# Patient Record
Sex: Female | Born: 1990 | Race: White | Hispanic: No | Marital: Married | Smoking: Former smoker
Health system: Southern US, Community
[De-identification: ages and names within clinical notes are randomized; demographics above are authoritative.]

## PROBLEM LIST (undated history)

## (undated) DIAGNOSIS — F319 Bipolar disorder, unspecified: Secondary | ICD-10-CM

## (undated) DIAGNOSIS — F32A Depression, unspecified: Secondary | ICD-10-CM

## (undated) DIAGNOSIS — O139 Gestational [pregnancy-induced] hypertension without significant proteinuria, unspecified trimester: Secondary | ICD-10-CM

## (undated) DIAGNOSIS — F419 Anxiety disorder, unspecified: Secondary | ICD-10-CM

## (undated) DIAGNOSIS — F329 Major depressive disorder, single episode, unspecified: Secondary | ICD-10-CM

## (undated) DIAGNOSIS — R011 Cardiac murmur, unspecified: Secondary | ICD-10-CM

## (undated) DIAGNOSIS — J45909 Unspecified asthma, uncomplicated: Secondary | ICD-10-CM

## (undated) DIAGNOSIS — D171 Benign lipomatous neoplasm of skin and subcutaneous tissue of trunk: Secondary | ICD-10-CM

## (undated) DIAGNOSIS — F3281 Premenstrual dysphoric disorder: Secondary | ICD-10-CM

## (undated) HISTORY — DX: Cardiac murmur, unspecified: R01.1

## (undated) HISTORY — DX: Depression, unspecified: F32.A

## (undated) HISTORY — DX: Major depressive disorder, single episode, unspecified: F32.9

## (undated) HISTORY — DX: Anxiety disorder, unspecified: F41.9

## (undated) HISTORY — DX: Bipolar disorder, unspecified: F31.9

## (undated) NOTE — *Deleted (*Deleted)
BH MD/PA/NP OP Progress Note  12/11/2019 2:26 PM Megan Houston  MRN:  [id number]  Chief Complaint:  HPI: *** Visit Diagnosis:    ICD-10-CM   1. PMDD (premenstrual dysphoric disorder)  F32.81 FLUoxetine (PROZAC) 20 MG capsule  2. Anxiety  F41.9 FLUoxetine (PROZAC) 20 MG capsule    clonazePAM (KLONOPIN) 0.5 MG tablet  3. MDD (major depressive disorder), recurrent, in full remission (HCC)  F33.42 FLUoxetine (PROZAC) 20 MG capsule    clonazePAM (KLONOPIN) 0.5 MG tablet    Past Psychiatric History: ***  Past Medical History:  Past Medical History:  Diagnosis Date  . Anxiety   . Bipolar disorder (HCC)   . Depression    No past surgical history on file.  Family Psychiatric History: ***  Family History:  Family History  Problem Relation Age of Onset  . Hypertension Other   . Diabetes Other   . Hypertension Mother   . Bipolar disorder Mother   . Alcohol abuse Mother   . Drug abuse Mother   . Cancer Mother        cervical  . OCD Father   . Anxiety disorder Father   . Diabetes Father   . Hypertension Father   . Bipolar disorder Sister   . Drug abuse Sister   . Alcohol abuse Sister   . Liver disease Paternal Grandmother   . Diabetes Maternal Grandmother   . Bipolar disorder Maternal Grandmother   . Anxiety disorder Maternal Grandmother   . Schizophrenia Maternal Grandmother     Social History:  Social History   Socioeconomic History  . Marital status: Single    Spouse name: Not on file  . Number of children: 0  . Years of education: Not on file  . Highest education level: Some college, no degree  Occupational History  . Not on file  Tobacco Use  . Smoking status: Former Smoker    Types: Cigarettes    Quit date: 05/27/2014    Years since quitting: 5.5  . Smokeless tobacco: Never Used  Substance and Sexual Activity  . Alcohol use: Not Currently    Alcohol/week: 0.0 standard drinks    Comment: occasional  . Drug use: No  . Sexual activity: Yes    Birth  control/protection: Pill  Other Topics Concern  . Not on file  Social History Narrative  . Not on file   Social Determinants of Health   Financial Resource Strain:   . Difficulty of Paying Living Expenses: Not on file  Food Insecurity:   . Worried About Programme researcher, broadcasting/film/video in the Last Year: Not on file  . Ran Out of Food in the Last Year: Not on file  Transportation Needs:   . Lack of Transportation (Medical): Not on file  . Lack of Transportation (Non-Medical): Not on file  Physical Activity:   . Days of Exercise per Week: Not on file  . Minutes of Exercise per Session: Not on file  Stress:   . Feeling of Stress : Not on file  Social Connections:   . Frequency of Communication with Friends and Family: Not on file  . Frequency of Social Gatherings with Friends and Family: Not on file  . Attends Religious Services: Not on file  . Active Member of Clubs or Organizations: Not on file  . Attends Banker Meetings: Not on file  . Marital Status: Not on file    Allergies:  Allergies  Allergen Reactions  . Codeine Nausea And Vomiting  .  Hydrocodone-Acetaminophen Nausea And Vomiting    Metabolic Disorder Labs: No results found for: HGBA1C, MPG No results found for: PROLACTIN No results found for: CHOL, TRIG, HDL, CHOLHDL, VLDL, LDLCALC No results found for: TSH  Therapeutic Level Labs: No results found for: LITHIUM No results found for: VALPROATE No components found for:  CBMZ  Current Medications: Current Outpatient Medications  Medication Sig Dispense Refill  . azithromycin (ZITHROMAX) 250 MG tablet Take 1 tablet (250 mg total) by mouth daily. Take first 2 tablets together, then 1 every day until finished. 6 tablet 0  . benzonatate (TESSALON) 100 MG capsule Take 1 capsule (100 mg total) by mouth every 8 (eight) hours. 30 capsule 0  . cetirizine (ZYRTEC ALLERGY) 10 MG tablet Take 1 tablet (10 mg total) by mouth daily. 30 tablet 0  . clonazePAM (KLONOPIN) 0.5  MG tablet Take 1 tablet (0.5 mg total) by mouth daily as needed for anxiety. 30 tablet 1  . FLUoxetine (PROZAC) 20 MG capsule Take 1 capsule (20 mg total) by mouth daily. 30 capsule 1  . predniSONE (DELTASONE) 10 MG tablet Take 2 tablets (20 mg total) by mouth daily. 15 tablet 0  . TRI-SPRINTEC 0.18/0.215/0.25 MG-35 MCG tablet Take 1 tablet by mouth daily.   10   No current facility-administered medications for this visit.     Musculoskeletal: Strength & Muscle Tone: {desc; muscle tone:32375} Gait & Station: {PE GAIT ED ZOXW:96045} Patient leans: {Patient Leans:21022755}  Psychiatric Specialty Exam: Review of Systems  There were no vitals taken for this visit.There is no height or weight on file to calculate BMI.  General Appearance: {Appearance:22683}  Eye Contact:  {BHH EYE CONTACT:22684}  Speech:  {Speech:22685}  Volume:  {Volume (PAA):22686}  Mood:  {BHH MOOD:22306}  Affect:  {Affect (PAA):22687}  Thought Process:  {Thought Process (PAA):22688}  Orientation:  {BHH ORIENTATION (PAA):22689}  Thought Content: {Thought Content:22690}   Suicidal Thoughts:  {ST/HT (PAA):22692}  Homicidal Thoughts:  {ST/HT (PAA):22692}  Memory:  {BHH MEMORY:22881}  Judgement:  {Judgement (PAA):22694}  Insight:  {Insight (PAA):22695}  Psychomotor Activity:  {Psychomotor (PAA):22696}  Concentration:  {Concentration:21399}  Recall:  {BHH GOOD/FAIR/POOR:22877}  Fund of Knowledge: {BHH GOOD/FAIR/POOR:22877}  Language: {BHH GOOD/FAIR/POOR:22877}  Akathisia:  {BHH YES OR NO:22294}  Handed:  {Handed:22697}  AIMS (if indicated): {Desc; done/not:10129}  Assets:  {Assets (PAA):22698}  ADL's:  {BHH WUJ'W:11914}  Cognition: {chl bhh cognition:304700322}  Sleep:  {BHH GOOD/FAIR/POOR:22877}   Screenings: PHQ2-9     Office Visit from 07/11/2017 in [facility]  PHQ-2 Total Score 0       Assessment and Plan: ***   Meta Hatchet, PA 12/11/2019, 2:26 PM

---
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
## 2010-01-04 ENCOUNTER — Ambulatory Visit: Payer: Self-pay | Admitting: Cardiology

## 2010-01-04 DIAGNOSIS — R030 Elevated blood-pressure reading, without diagnosis of hypertension: Secondary | ICD-10-CM | POA: Insufficient documentation

## 2010-01-04 DIAGNOSIS — R011 Cardiac murmur, unspecified: Secondary | ICD-10-CM | POA: Insufficient documentation

## 2010-01-05 ENCOUNTER — Encounter: Payer: Self-pay | Admitting: Cardiology

## 2010-01-11 ENCOUNTER — Telehealth (INDEPENDENT_AMBULATORY_CARE_PROVIDER_SITE_OTHER): Payer: Self-pay | Admitting: *Deleted

## 2010-01-20 ENCOUNTER — Emergency Department (HOSPITAL_COMMUNITY)
Admission: EM | Admit: 2010-01-20 | Discharge: 2010-01-20 | Payer: Self-pay | Source: Home / Self Care | Admitting: Emergency Medicine

## 2010-02-17 ENCOUNTER — Encounter (INDEPENDENT_AMBULATORY_CARE_PROVIDER_SITE_OTHER): Payer: Self-pay | Admitting: *Deleted

## 2010-03-23 NOTE — Letter (Signed)
Summary: Generic Engineer, agricultural at [facility] [address]   [place], [place] [address]   Phone: [phone]  Fax: [phone]        February 17, 2010 MRN: [id number]    Megan Houston [address] [place], [place]  [address]    Dear Ms. Oberholzer,   You were asked to have an echocardiogaram done following your November 16th office visit. However, it does not appear this has been done yet.  Please, contact our office at your earliest convenience to have this test rescheduled.   If you will not be able to do your test at this time, please notify our office so that we can properly document this in  your chart.        Sincerely,  Cyril Loosen, RN, BSN  This letter has been electronically signed by your physician.

## 2010-03-23 NOTE — Progress Notes (Signed)
Summary: Pending Echo  Phone Note Outgoing Call Call back at Home Phone [phone]   Call placed by: Cyril Loosen, RN, BSN,  January 11, 2010 4:17 PM Summary of Call: Left message to call back on voicemail regarding Echo that was scheduled on 11/21 but does not appear to have been done. Initial call taken by: Cyril Loosen, RN, BSN,  January 11, 2010 4:17 PM  Follow-up for Phone Call        Left message to call back on voicemail.  Cyril Loosen, RN, BSN  January 18, 2010 8:58 AM  Left message to call back on voicemail. Cyril Loosen, RN, BSN  February 01, 2010 4:25 PM      Appended Document: Pending Echo Letter mailed asking pt to call office to r/s test.

## 2010-03-23 NOTE — Letter (Signed)
Summary: [facility] DEPARTMENT   Imported By: Zachary George 01/04/2010 09:53:37  _____________________________________________________________________  External Attachment:    Type:   Image     Comment:   External Document

## 2010-03-23 NOTE — Miscellaneous (Signed)
Summary: DEMOGRAPHICS  DEMOGRAPHICS   Imported By: Claudette Laws 01/05/2010 16:59:54  _____________________________________________________________________  External Attachment:    Type:   Image     Comment:   External Document

## 2010-03-23 NOTE — Assessment & Plan Note (Signed)
Summary: NP-SYSTOLIC MURMUR III   Visit Type:  Initial Consult Primary [place]:  [facility] Department   History of Present Illness: 20 year old woman referred for cardiology consultation. [facility] October for a routine visit. She is described as having had a 2-3/6 systolic murmur radiating to the carotids. in speaking with the patient and her mother, they indicate a long-standing history of "heart murmur" since childhood. I was able to locate a previous pediatric echocardiogram report from 2005 that was completely normal however.  From a symptom perspective, she indicates a long-standing history of very atypical, "sharp" and shooting pains in the chest precipitated sometimes by emotional stress. She has no clearly reproducible chest pain with exertion. She describes a history of shortness of breath, but states that it is more the sensation that she needs to take a deep breath at times when she's sitting down, rather than necessarily with exertion.  She reports no other chronic medical problems, no major hospitalizations. Reports no major functional limitations.  Preventive Screening-Counseling & Management  Alcohol-Tobacco     Smoking Status: current     Smoking Cessation Counseling: yes     Packs/Day: 1/2 PPD  Current Medications (verified): 1)  None  Allergies (verified): No Known Drug Allergies  Comments:  Nurse/Medical Assistant: The patient is currently on no medications and no changes to the medication list were required.  Past History:  Family History: Last updated: 01/04/2010 Family History of Diabetes and Hypertension No described history of congenital heart disease  Social History: Last updated: 01/04/2010 Tobacco Use - No Alcohol Use - no Single   Past Medical History: No reported major medical conditions hospitalizations  Past Surgical History: Unremarkable  Family  History: Family History of Diabetes and Hypertension No described history of congenital heart disease  Social History: Tobacco Use - No Alcohol Use - no Single  Smoking Status:  current Packs/Day:  1/2 PPD  Review of Systems  The patient denies anorexia, fever, weight loss, syncope, dyspnea on exertion, peripheral edema, prolonged cough, hemoptysis, melena, hematochezia, and severe indigestion/heartburn.         Otherwise reviewed and negative except as outlined.  Vital Signs:  Patient profile:   20 year old female Height:      65 inches Weight:      136 pounds BMI:     22.71 Pulse rate:   90 / minute BP sitting:   131 / 87  (left arm) Cuff size:   regular  Vitals Entered By: Carlye Grippe (January 04, 2010 1:07 PM)  Physical Exam  Additional Exam:  Normally nourished appearing young woman in no acute distress. HEENT: Conjunctiva and lids normal, or frank clear. Neck: Supple, no elevated JVP or carotid bruits, no thyromegaly. Lungs: Clear auscultation, nonlabored. Cardiac: Regular rate and rhythm, no S3 gallop. No significant systolic or diastolic murmur is appreciated today. First heart sound prominent. No obvious midsystolic click. No pericardial rub or S3 gallop. Abdomen: Soft, nontender, bowel sounds present. Skin: Warm and dry. Musculoskeletal: No kyphosis. Extremities: No pitting edema, pulses full. Neuropsychiatric: Alert and oriented x3, affect appropriate.   Echocardiogram  Procedure date:  04/01/2003  Findings:      Pediatric echocardiogram:  Structurally normal heart and arch. Normal chamber sizes, valves and function. No shunts. No effusion.  EKG  Procedure date:  01/04/2010  Findings:      Normal sinus rhythm at 75 beats per minute with normal intervals.  Impression &  Recommendations:  Problem # 1:  CARDIAC MURMUR (ICD-785.2)  Patient referred with reported long-standing history of heart murmur since childhood, although with echocardiogram  from 2005 showing no abnormalities. Records indicate description of a 2-3/6 systolic murmur radiating to the carotids on a [facility]up [facility] back in October, although this murmur is not appreciated today. ECG is normal today. She reports some symptomatology over the years without significant change, fairly atypical from the perspective of valvular heart disease or shunting. Significant pathology seems unlikely at this point, however I cannot explain the apparent fluctuating murmur based on available information. Some individuals with mitral valve prolapse can have fluctuating systolic murmurs based on loading conditions, weight, heart rate, and blood pressure. Other fixed lesions would be more likely to provide a consistent or progressive examination. We plan a 2-D echocardiogram to followup on cardiac structure and valvular function. Further plans based on this.  Orders: EKG w/ Interpretation (93000) 2-D Echocardiogram (2D Echo)  Problem # 2:  ELEVATED BP READING WITHOUT DX HYPERTENSION (ICD-796.2)  Blood pressure somewhat elevated today, however normal at her visit [facility] back in October. Can continue to follow this with primary care Marrion Accomando.  Patient Instructions: 1)  Your physician has requested that you have an echocardiogram.  Echocardiography is a painless test that uses sound waves to create images of your heart. It provides your doctor with information about the size and shape of your heart and how well your heart's chambers and valves are working.  This procedure takes approximately one hour. There are no restrictions for this procedure. If the results of your test are normal or stable, you will receive a letter. If they are abnormal, the nurse will contact you by phone. 2)  Follow up will be based on test results.

## 2010-04-03 ENCOUNTER — Encounter (INDEPENDENT_AMBULATORY_CARE_PROVIDER_SITE_OTHER): Payer: Self-pay | Admitting: *Deleted

## 2010-04-12 NOTE — Letter (Signed)
Summary: Certified Letter Re: Echo  Architectural technologist at [facility]  [address]   [place], [place] [address]   Phone: [phone]  Fax: [phone]        April 03, 2010 MRN: [id number]    Megan Houston [address] [place], [place]  [address]    Dear Ms. Lemonds,   You were asked to have an echocardiogram done following your November 16th office visit. This was scheduled for November 21st. However, it does not appear this has been done yet.  Please, contact our office at your earliest convenience to reschedule this test.   If you will not be able to do the test at this time, please notify our office so that we can properly document this in  your chart.        Sincerely,  Cyril Loosen, RN, BSN  This letter has been electronically signed by your physician.

## 2010-04-29 ENCOUNTER — Encounter: Payer: Self-pay | Admitting: Cardiology

## 2010-05-09 NOTE — Letter (Signed)
Summary: Returned Certified Letter-Unclaimed  Returned Certified Letter-Unclaimed   Imported By: Cyril Loosen, RN, BSN 05/04/2010 12:24:48  _____________________________________________________________________  External Attachment:    Type:   Image     Comment:   External Document

## 2011-08-14 ENCOUNTER — Encounter: Payer: Self-pay | Admitting: *Deleted

## 2012-02-09 ENCOUNTER — Emergency Department (HOSPITAL_COMMUNITY)
Admission: EM | Admit: 2012-02-09 | Discharge: 2012-02-09 | Disposition: A | Discharge status: Home / Self Care | Payer: Self-pay | Attending: Emergency Medicine | Admitting: Emergency Medicine

## 2012-02-09 ENCOUNTER — Encounter (HOSPITAL_COMMUNITY): Payer: Self-pay | Admitting: *Deleted

## 2012-02-09 DIAGNOSIS — R220 Localized swelling, mass and lump, head: Secondary | ICD-10-CM | POA: Insufficient documentation

## 2012-02-09 DIAGNOSIS — K047 Periapical abscess without sinus: Secondary | ICD-10-CM | POA: Insufficient documentation

## 2012-02-09 DIAGNOSIS — F17200 Nicotine dependence, unspecified, uncomplicated: Secondary | ICD-10-CM | POA: Insufficient documentation

## 2012-02-09 DIAGNOSIS — R221 Localized swelling, mass and lump, neck: Secondary | ICD-10-CM | POA: Insufficient documentation

## 2012-02-09 DIAGNOSIS — Z79899 Other long term (current) drug therapy: Secondary | ICD-10-CM | POA: Insufficient documentation

## 2012-02-09 MED ORDER — IBUPROFEN 800 MG PO TABS
800.0000 mg | ORAL_TABLET | Freq: Once | ORAL | Status: AC
  Administered 2012-02-09: 800 mg via ORAL
  Filled 2012-02-09: qty 1

## 2012-02-09 MED ORDER — HYDROCODONE-ACETAMINOPHEN 5-325 MG PO TABS: 1.0000 | ORAL_TABLET | ORAL | Status: DC | PRN

## 2012-02-09 MED ORDER — AMOXICILLIN 500 MG PO CAPS: 500.0000 mg | ORAL_CAPSULE | Freq: Three times a day (TID) | ORAL | Status: DC

## 2012-02-09 MED ORDER — PENICILLIN V POTASSIUM 250 MG PO TABS
500.0000 mg | ORAL_TABLET | Freq: Once | ORAL | Status: AC
  Administered 2012-02-09: 500 mg via ORAL
  Filled 2012-02-09: qty 2

## 2012-02-09 MED ORDER — ONDANSETRON HCL 4 MG PO TABS
4.0000 mg | ORAL_TABLET | Freq: Once | ORAL | Status: AC
  Administered 2012-02-09: 4 mg via ORAL
  Filled 2012-02-09: qty 1

## 2012-02-09 NOTE — ED Notes (Signed)
?   Abscess behind bottom front teeth x 2 days.  Reports had tongue ring in place that was rubbing area so removed tongue ring.  Reports draining pus.  States is taking PO abx for dental abscess at this time.

## 2012-02-09 NOTE — ED Provider Notes (Signed)
History     CSN: [id number]  Arrival date & time 02/09/12  1352   First MD Initiated Contact with Patient 02/09/12 1518      Chief Complaint  Patient presents with  . Abscess    (Consider location/radiation/quality/duration/timing/severity/associated sxs/prior treatment) HPI Comments: Pt reports 2 days of pain and what she feels is an abscess behind the lower incisors. She reports some pus like drainage present. She feels a sensation of swelling wth her tongue. No recent injury to teeth, but toothache from time to time. Pt was wearing a tongue ring that was irritating the area, but this has been removed. She has not taken any medication for this problem.  The history is provided by the patient.    History reviewed. No pertinent past medical history.  History reviewed. No pertinent past surgical history.  Family History  Problem Relation Age of Onset  . Hypertension Other   . Diabetes Other     History  Substance Use Topics  . Smoking status: Current Every Day Smoker    Types: Cigarettes  . Smokeless tobacco: Not on file  . Alcohol Use: Yes     Comment: occasional    OB History    Grav Para Term Preterm Abortions TAB SAB Ect Mult Living                  Review of Systems  Constitutional: Negative for activity change.       All ROS Neg except as noted in HPI  HENT: Positive for dental problem. Negative for nosebleeds and neck pain.   Eyes: Negative for photophobia and discharge.  Respiratory: Negative for cough, shortness of breath and wheezing.   Cardiovascular: Negative for chest pain and palpitations.  Gastrointestinal: Negative for abdominal pain and blood in stool.  Genitourinary: Negative for dysuria, frequency and hematuria.  Musculoskeletal: Negative for back pain and arthralgias.  Skin: Negative.   Neurological: Negative for dizziness, seizures and speech difficulty.  Psychiatric/Behavioral: Negative for hallucinations and confusion.    Allergies   Codeine  Home Medications   Current Outpatient Rx  Name  Route  Sig  Dispense  Refill  . PRESCRIPTION MEDICATION   Oral   Take 1 tablet by mouth daily.         . SERTRALINE HCL 100 MG PO TABS   Oral   Take 100 mg by mouth daily.           BP 141/89  Pulse 88  Temp 98.1 F (36.7 C) (Oral)  Resp 16  Ht 5\' 5"  (1.651 m)  Wt 165 lb (74.844 kg)  BMI 27.46 kg/m2  SpO2 100%  LMP 01/23/2012  Physical Exam  Nursing note and vitals reviewed. Constitutional: She is oriented to person, place, and time. She appears well-developed and well-nourished.  Non-toxic appearance.  HENT:  Head: Normocephalic.  Right Ear: Tympanic membrane and external ear normal.  Left Ear: Tympanic membrane and external ear normal.       Small opening at the base of the the lower anterior jaw. No active drainage at this time. Minimal swelling. No swelling under the tongue.  Airway patent.  Eyes: EOM and lids are normal. Pupils are equal, round, and reactive to light.  Neck: Normal range of motion. Neck supple. Carotid bruit is not present.  Cardiovascular: Normal rate, regular rhythm, normal heart sounds, intact distal pulses and normal pulses.   Pulmonary/Chest: Breath sounds normal. No respiratory distress.  Abdominal: Soft. Bowel sounds are normal. There is  no tenderness. There is no guarding.  Musculoskeletal: Normal range of motion.  Lymphadenopathy:       Head (right side): No submandibular adenopathy present.       Head (left side): No submandibular adenopathy present.    She has no cervical adenopathy.  Neurological: She is alert and oriented to person, place, and time. She has normal strength. No cranial nerve deficit or sensory deficit.  Skin: Skin is warm and dry.  Psychiatric: She has a normal mood and affect. Her speech is normal.    ED Course  Procedures (including critical care time)  Labs Reviewed - No data to display No results found.   No diagnosis found.    MDM  I  have reviewed nursing notes, vital signs, and all appropriate lab and imaging results for this patient. Pt advised to see a dentist for evaluation of lower incisors and progression of the possible abscess. Rx for amoxil and norco given to the patient. Pt to return if any high fever or complications.       Kathie Dike, [place] 02/11/12 [phone]

## 2012-02-13 NOTE — ED Provider Notes (Signed)
Medical screening examination/treatment/procedure(s) were performed by non-physician practitioner and as supervising physician I was immediately available for consultation/collaboration.  Raeford Razor, MD 02/13/12 [phone]

## 2012-10-16 ENCOUNTER — Emergency Department (HOSPITAL_COMMUNITY): Payer: Self-pay

## 2012-10-16 ENCOUNTER — Encounter (HOSPITAL_COMMUNITY): Payer: Self-pay | Admitting: Emergency Medicine

## 2012-10-16 ENCOUNTER — Other Ambulatory Visit: Payer: Self-pay

## 2012-10-16 ENCOUNTER — Emergency Department (HOSPITAL_COMMUNITY)
Admission: EM | Admit: 2012-10-16 | Discharge: 2012-10-16 | Disposition: A | Discharge status: Home / Self Care | Payer: Self-pay | Attending: Emergency Medicine | Admitting: Emergency Medicine

## 2012-10-16 DIAGNOSIS — J029 Acute pharyngitis, unspecified: Secondary | ICD-10-CM | POA: Insufficient documentation

## 2012-10-16 DIAGNOSIS — R42 Dizziness and giddiness: Secondary | ICD-10-CM | POA: Insufficient documentation

## 2012-10-16 DIAGNOSIS — R0789 Other chest pain: Secondary | ICD-10-CM

## 2012-10-16 DIAGNOSIS — F17200 Nicotine dependence, unspecified, uncomplicated: Secondary | ICD-10-CM | POA: Insufficient documentation

## 2012-10-16 DIAGNOSIS — R1930 Abdominal rigidity, unspecified site: Secondary | ICD-10-CM | POA: Insufficient documentation

## 2012-10-16 DIAGNOSIS — J069 Acute upper respiratory infection, unspecified: Secondary | ICD-10-CM | POA: Insufficient documentation

## 2012-10-16 DIAGNOSIS — Z79899 Other long term (current) drug therapy: Secondary | ICD-10-CM | POA: Insufficient documentation

## 2012-10-16 DIAGNOSIS — J3489 Other specified disorders of nose and nasal sinuses: Secondary | ICD-10-CM | POA: Insufficient documentation

## 2012-10-16 DIAGNOSIS — R071 Chest pain on breathing: Secondary | ICD-10-CM | POA: Insufficient documentation

## 2012-10-16 DIAGNOSIS — L539 Erythematous condition, unspecified: Secondary | ICD-10-CM | POA: Insufficient documentation

## 2012-10-16 DIAGNOSIS — Z3202 Encounter for pregnancy test, result negative: Secondary | ICD-10-CM | POA: Insufficient documentation

## 2012-10-16 MED ORDER — PREDNISONE 10 MG PO TABS: ORAL_TABLET | ORAL | Status: DC

## 2012-10-16 MED ORDER — PSEUDOEPHEDRINE HCL ER 120 MG PO TB12: 120.0000 mg | ORAL_TABLET | Freq: Two times a day (BID) | ORAL | Status: DC

## 2012-10-16 MED ORDER — PROMETHAZINE-DM 6.25-15 MG/5ML PO SYRP: 120.0000 mL | ORAL_SOLUTION | Freq: Four times a day (QID) | ORAL | Status: DC

## 2012-10-16 NOTE — ED Provider Notes (Signed)
CSN: [id number]     Arrival date & time 10/16/12  1618 History   First MD Initiated Contact with Patient 10/16/12 1658     Chief Complaint  Patient presents with  . Cough   (Consider location/radiation/quality/duration/timing/severity/associated sxs/prior Treatment) Patient is a 22 y.o. female presenting with cough. The history is provided by the patient.  Cough Cough characteristics:  Productive Sputum characteristics:  Nondescript Severity:  Moderate Onset quality:  Gradual Duration:  3 weeks Timing:  Intermittent Progression:  Worsening Chronicity:  New Smoker: yes   Context: upper respiratory infection and weather changes   Relieved by:  Nothing Worsened by:  Nothing tried Associated symptoms: chest pain and sore throat   Associated symptoms: no eye discharge, no fever, no shortness of breath and no wheezing     History reviewed. No pertinent past medical history. History reviewed. No pertinent past surgical history. Family History  Problem Relation Age of Onset  . Hypertension Other   . Diabetes Other    History  Substance Use Topics  . Smoking status: Current Every Day Smoker    Types: Cigarettes  . Smokeless tobacco: Not on file  . Alcohol Use: Yes     Comment: occasional   OB History   Grav Para Term Preterm Abortions TAB SAB Ect Mult Living                 Review of Systems  Constitutional: Negative for fever and activity change.       All ROS Neg except as noted in HPI  HENT: Positive for sore throat. Negative for nosebleeds and neck pain.   Eyes: Negative for photophobia and discharge.  Respiratory: Positive for cough. Negative for shortness of breath and wheezing.   Cardiovascular: Positive for chest pain. Negative for palpitations.  Gastrointestinal: Negative for abdominal pain and blood in stool.  Genitourinary: Negative for dysuria, frequency and hematuria.  Musculoskeletal: Negative for back pain and arthralgias.  Skin: Negative.    Neurological: Negative for dizziness, seizures and speech difficulty.  Psychiatric/Behavioral: Negative for hallucinations and confusion.    Allergies  Codeine  Home Medications   Current Outpatient Rx  Name  Route  Sig  Dispense  Refill  . amoxicillin (AMOXIL) 500 MG capsule   Oral   Take 1 capsule (500 mg total) by mouth 3 (three) times daily.   21 capsule   0   . HYDROcodone-acetaminophen (NORCO/VICODIN) 5-325 MG per tablet   Oral   Take 1 tablet by mouth every 4 (four) hours as needed for pain.   16 tablet   0   . PRESCRIPTION MEDICATION   Oral   Take 1 tablet by mouth daily.         . sertraline (ZOLOFT) 100 MG tablet   Oral   Take 100 mg by mouth daily.          BP 142/89  Pulse 84  Temp(Src) 98.7 F (37.1 C) (Oral)  Resp 19  SpO2 100%  LMP 09/07/2012 Physical Exam  Nursing note and vitals reviewed. Constitutional: She is oriented to person, place, and time. She appears well-developed and well-nourished.  Non-toxic appearance.  HENT:  Head: Normocephalic.  Right Ear: Tympanic membrane and external ear normal.  Left Ear: Tympanic membrane and external ear normal.  Mouth/Throat: Posterior oropharyngeal erythema present.  Eyes: EOM and lids are normal. Pupils are equal, round, and reactive to light.  Neck: Normal range of motion. Neck supple. Carotid bruit is not present.  Cardiovascular: Normal rate,  regular rhythm, normal heart sounds, intact distal pulses and normal pulses.   Pulmonary/Chest: Breath sounds normal. No respiratory distress.  Anterior chest wall soreness with deep breath and palpation. Symmetrical rise and fall of the chest.  Abdominal: Soft. Bowel sounds are normal. There is no tenderness. There is no guarding.  Musculoskeletal: Normal range of motion.  Lymphadenopathy:       Head (right side): No submandibular adenopathy present.       Head (left side): No submandibular adenopathy present.    She has no cervical adenopathy.   Neurological: She is alert and oriented to person, place, and time. She has normal strength. No cranial nerve deficit or sensory deficit. She exhibits normal muscle tone. Coordination normal.  Skin: Skin is warm and dry.  Psychiatric: She has a normal mood and affect. Her speech is normal.    ED Course  Procedures (including critical care time) Labs Review Labs Reviewed - No data to display Imaging Review No results found. Pulse ox 100% on room air. WNL by my interpretation. MDM  No diagnosis found. **I have reviewed nursing notes, vital signs, and all appropriate lab and imaging results for this patient.*  Pt presents to ED with 3 weeks of chest discomfort related to coughing. No hemoptosis or high fever.  Chest xray is negative for acute event. Pt speaks in complete sentences. Ambulates in hall without problem. Plan- sudafed for congestion. Prednisone taper, and promethazine-codeine cough med. Pt to follow up with pcp or return to the ED if not improving.  Kathie Dike, PA-C 10/16/12 1744  Shelda Jakes, MD 10/16/12 [phone]

## 2012-10-16 NOTE — ED Provider Notes (Signed)
Scribed for No att. providers found, the patient was seen in room APFT24/APFT24. This chart was scribed by Lewanda Rife, ED scribe. Patient's care was started at 1820  CSN: [id number]     Arrival date & time 10/16/12  1618 History   First MD Initiated Contact with Patient 10/16/12 1658     Chief Complaint  Patient presents with  . Cough   (Consider location/radiation/quality/duration/timing/severity/associated sxs/prior Treatment) The history is provided by the patient.   HPI Comments: Megan Houston is a 22 y.o. female who presents to the Emergency Department complaining of waxing and waning moderate "heaviness" in the chest radiating to back onset 7 days. Reports pain is 7/10 in severity at this time and 9/10 at its worst. Reports associated shortness of breath, headaches, dizziness, pleuritic chest pain, non-productive cough, sore throat, congestion, and rhinorrhea. Denies any aggravating or alleviating factors. Denies fever, changes in vision, abdominal pain, emesis, nausea, dysuria, diarrhea, bleeding disorder, rash, edema, and myalgias. Reports taking birth control pills. Denies taking any mediations PTA to alleviate symptoms.  LMP 09/07/12    History reviewed. No pertinent past medical history. History reviewed. No pertinent past surgical history. Family History  Problem Relation Age of Onset  . Hypertension Other   . Diabetes Other    History  Substance Use Topics  . Smoking status: Current Every Day Smoker    Types: Cigarettes  . Smokeless tobacco: Not on file  . Alcohol Use: Yes     Comment: occasional   OB History   Grav Para Term Preterm Abortions TAB SAB Ect Mult Living                 Review of Systems  Constitutional: Negative for fever.  HENT: Positive for congestion, sore throat and rhinorrhea.   Eyes: Negative for visual disturbance.  Respiratory: Positive for cough.   Cardiovascular: Positive for chest pain. Negative for leg swelling.   Gastrointestinal: Negative for nausea, vomiting, abdominal pain and constipation.  Genitourinary: Negative for dysuria.  Musculoskeletal: Negative for myalgias.  Skin: Negative for rash.  Neurological: Positive for dizziness and headaches.  Hematological: Does not bruise/bleed easily.  Psychiatric/Behavioral: Negative for confusion.    Allergies  Codeine  Home Medications   Current Outpatient Rx  Name  Route  Sig  Dispense  Refill  . PRESCRIPTION MEDICATION   Oral   Take 1 tablet by mouth daily.         . predniSONE (DELTASONE) 10 MG tablet      5,4,3,2,1 - take with food   15 tablet   0   . promethazine-dextromethorphan (PROMETHAZINE-DM) 6.25-15 MG/5ML syrup   Oral   Take 120 mLs by mouth every 6 (six) hours.   118 mL   0   . pseudoephedrine (SUDAFED 12 HOUR) 120 MG 12 hr tablet   Oral   Take 1 tablet (120 mg total) by mouth every 12 (twelve) hours.   20 tablet   0    BP 142/89  Pulse 84  Temp(Src) 98.7 F (37.1 C) (Oral)  Resp 19  SpO2 100%  LMP 09/07/2012 Physical Exam  Nursing note and vitals reviewed. Constitutional: She is oriented to person, place, and time. She appears well-developed and well-nourished. No distress.  HENT:  Head: Normocephalic and atraumatic.  Mouth/Throat: Uvula is midline and mucous membranes are normal. Posterior oropharyngeal erythema present. No oropharyngeal exudate.  Eyes: Conjunctivae and EOM are normal. No scleral icterus.  Neck: Neck supple. No tracheal deviation present.  Cardiovascular: Normal  rate, regular rhythm and normal heart sounds.   No murmur heard. Pulmonary/Chest: Effort normal and breath sounds normal. No respiratory distress.  Abdominal: Soft. Bowel sounds are normal. There is no tenderness.  Musculoskeletal: Normal range of motion.  Lymphadenopathy:    She has no cervical adenopathy.  Neurological: She is alert and oriented to person, place, and time.  Skin: Skin is warm and dry.  Psychiatric: She has  a normal mood and affect. Her behavior is normal.    ED Course  Procedures (including critical care time) Medications - No data to display Labs Review Labs Reviewed - No data to display Imaging Review Dg Chest 2 View  10/16/2012   *RADIOLOGY REPORT*  Clinical Data: 1-week history of chest pressure, left shoulder pain, nasal congestion, and difficulty in taking a deep breath. Smoker with current history of asthma.  CHEST - 2 VIEW  Comparison: None.  Findings: Cardiomediastinal silhouette unremarkable.  Lungs clear. Bronchovascular markings normal.  Pulmonary vascularity normal.  No pleural effusions.  No pneumothorax.  Visualized bony thorax intact.  IMPRESSION: Normal examination.   Original Report Authenticated By: Hulan Saas, M.D.   Test was negative.   Date: 10/16/2012  Rate: 68  Rhythm: normal sinus rhythm and sinus arrhythmia  QRS Axis: normal  Intervals: normal  ST/T Wave abnormalities: normal  Conduction Disutrbances:none  Narrative Interpretation:   Old EKG Reviewed: none available    MDM   1. Chest wall pain   2. URI (upper respiratory infection)    Patient's chest pressure for one week has been associated with upper respiratory cold-type symptoms. Patient stated just a cough for one week denied 3 weeks to [place]. The chest pressures made worse by taking a deep breath. Chest x-ray negative for pneumonia pneumothorax or pulmonary edema. EKG has no acute changes. Patient was late for her period this month pregnancy test is negative. Suspect all symptoms related to upper respiratory infection.  Patient seen by me.  I personally performed the services described in this documentation, which was scribed in my presence. The recorded information has been reviewed and is accurate.     Shelda Jakes, MD 10/16/12 [phone]

## 2012-10-16 NOTE — ED Notes (Signed)
Pt c/o cough x 3 weeks. C/o sob x 1 week. Pressure to upper chest which increases with cough and deep breathing. Pt also c/o left arm pain from posterior shoulder down arm intermittent-no arm pain at present, states it feels like she needs to stretch her arm out. Nad. No resp distress noted. Unable to cough up mucus

## 2012-10-16 NOTE — ED Notes (Signed)
Pt eating in waiting room when called

## 2012-10-17 LAB — POCT PREGNANCY, URINE: Preg Test, Ur: NEGATIVE

## 2014-07-23 ENCOUNTER — Other Ambulatory Visit: Payer: Self-pay

## 2014-07-23 ENCOUNTER — Ambulatory Visit (INDEPENDENT_AMBULATORY_CARE_PROVIDER_SITE_OTHER): Payer: BLUE CROSS/BLUE SHIELD | Admitting: Licensed Clinical Social Worker

## 2014-07-23 DIAGNOSIS — F411 Generalized anxiety disorder: Secondary | ICD-10-CM | POA: Insufficient documentation

## 2014-07-23 DIAGNOSIS — F3181 Bipolar II disorder: Secondary | ICD-10-CM

## 2014-07-23 NOTE — Progress Notes (Signed)
   THERAPIST PROGRESS NOTE  Session Time: 8:23 a.m. - (Delay due to check in proceedings)  Participation Level: Active  Behavioral Response: NeatAlertEuthymic  Type of Therapy: Individual Therapy  Treatment Goals addressed: Coping; boundary setting; assertive communication  Interventions: CBT, Solution Focused, Supportive and Family Systems  Summary: Megan Houston is a 24 y.o. female who presents with a reported improvement in her depression and anxiety.  Primary dx remains Bi-Polar 2 disorder and client is responding favorably to current medications as prescribed by Dr. Gretel Acre.  "I don't really have any negative things to report."  Shelah has distanced herself from her mother and sister and did get to spend time with her nephew.  The nephew was sick and as a result of the sister's reaction to his needs.  As a result of this client was able to conclude that sister does not have her priorities in order.  Client has not seen or spoken to her mother in a month.  "Everything gets turned upside down and turned around when I talk to her."  Client worries that her sister needs help so that their communication could improve. Overall she believes that she is better off in terms of her moods by disconnecting herself from them.  "I hate to say it but I'm better when I stay away from them."   "I've been doing this my whole life." Megan Houston talked through her emotional struggles with family dysfunction and negative emotional and behavioral interactions.  Patterns in their high conflict behaviors were identified by client. Some work stressors remain and she admits to a constant fear of being terminated from her job yet admits that she has improved somewhat in stopping those negative thoughts.  Client identified feeling anxiety around having some spiritual dilemmas and her faith in God.  "I pray."  She is seeking peace and wanting to release self from feelings of guilt regarding how she feels about her  mother.  Megan Houston asked LCSW about having a conversation with her mother to ask why her mother acts towards her the way she does.  Client's friend advised against it.  She was receptive to the therapy exercise for writing a letter and agreed to bring this to our next session.  "I think we're going in the right direction" was client's response about effectiveness of therapy sessions.  Suicidal/Homicidal: Nowithout intent/plan  Therapist Response: Ongoing supportive counseling with insight and active and reflective listening to continue building rapport with client.  Additional focus on strategies that promote resiliency in living with mood swings and negative thoughts. CBT, boundary setting, assertiveness in communication and solution focused strategies explored with client to empower her and promote healing.  Commended client on her progress and commitment to change and recovery.  Suggested use of mood tracker application and provided her with a therapy exercise to complete and return to next session with.  This is entitled "The [employer] Letter."  Plan: Return again in 4 weeks.  Diagnosis: Bipolar 2 disorder (296.89  F31.81)    Megan Dibble, LCSW 07/23/2014

## 2014-07-27 ENCOUNTER — Encounter: Payer: Self-pay | Admitting: Licensed Clinical Social Worker
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
## 2014-07-28 ENCOUNTER — Telehealth: Payer: Self-pay

## 2014-07-28 NOTE — Telephone Encounter (Signed)
pt called states that she got her rx yesterday on lamotrigine 100mg  and that later that day her dog ate 17 pills.  pt states that she was on the phone with the vet and posion control and that she been up all night with a sick dog.  pt wants to know if you will give her a rx for the 17 pills her dog ate.

## 2014-07-29 MED ORDER — LAMOTRIGINE 100 MG PO TABS: 100.0000 mg | ORAL_TABLET | Freq: Every day | ORAL | Status: DC

## 2014-07-29 NOTE — Telephone Encounter (Signed)
I will dispense the medication.

## 2014-08-20 ENCOUNTER — Ambulatory Visit: Payer: Self-pay | Admitting: Licensed Clinical Social Worker

## 2014-08-26 ENCOUNTER — Ambulatory Visit (INDEPENDENT_AMBULATORY_CARE_PROVIDER_SITE_OTHER): Payer: BLUE CROSS/BLUE SHIELD | Admitting: Psychiatry

## 2014-08-26 ENCOUNTER — Ambulatory Visit (INDEPENDENT_AMBULATORY_CARE_PROVIDER_SITE_OTHER): Payer: BLUE CROSS/BLUE SHIELD | Admitting: Licensed Clinical Social Worker

## 2014-08-26 ENCOUNTER — Encounter: Payer: Self-pay | Admitting: Psychiatry

## 2014-08-26 VITALS — BP 136/82 | HR 86 | Temp 98.9°F | Ht 60.5 in | Wt 237.0 lb

## 2014-08-26 DIAGNOSIS — F411 Generalized anxiety disorder: Secondary | ICD-10-CM | POA: Diagnosis not present

## 2014-08-26 DIAGNOSIS — F3181 Bipolar II disorder: Secondary | ICD-10-CM | POA: Diagnosis not present

## 2014-08-26 DIAGNOSIS — F3160 Bipolar disorder, current episode mixed, unspecified: Secondary | ICD-10-CM | POA: Diagnosis not present

## 2014-08-26 MED ORDER — SERTRALINE HCL 50 MG PO TABS: 50.0000 mg | ORAL_TABLET | Freq: Every day | ORAL | Status: DC

## 2014-08-26 MED ORDER — LAMOTRIGINE 150 MG PO TABS: 150.0000 mg | ORAL_TABLET | Freq: Every day | ORAL | Status: DC

## 2014-08-26 MED ORDER — BUSPIRONE HCL 10 MG PO TABS: 10.0000 mg | ORAL_TABLET | Freq: Two times a day (BID) | ORAL | Status: DC

## 2014-08-26 NOTE — Progress Notes (Signed)
BH MD/PA/NP OP Progress Note  08/26/2014 1:13 PM Megan Houston  MRN:  [id number]  Subjective:  Patient is a 24 year old female with history of bipolar disorder who presented for follow-up appointment. She reported that she continues to have more swings anxiety and is unable to control her temper. She reported that she is currently feeling anxious and wants to have her medications adjusted. She reported that she messed up with her boyfriend and he realizes that she continues to have more swings often. Patient reported that she only sleeps 3-4 hours at night. She is also having constant headaches which are not improving. She currently is not taking any medication for the same as she reported that she ran out of the medication. She denied having any suicidal homicidal ideations or plans.   Chief Complaint:  Chief Complaint    Follow-up     Visit Diagnosis:     ICD-9-CM ICD-10-CM   1. Bipolar affective disorder, current episode mixed, without psychotic features, current episode severity unspecified 296.80 F31.60   2. Generalized anxiety disorder 300.02 F41.1     Past Medical History:  Past Medical History  Diagnosis Date  . Bipolar disorder   . Anxiety   . Depression    History reviewed. No pertinent past surgical history. Family History:  Family History  Problem Relation Age of Onset  . Hypertension Other   . Diabetes Other   . Hypertension Mother   . Bipolar disorder Mother   . Alcohol abuse Mother   . Drug abuse Mother   . OCD Father   . Anxiety disorder Father   . Diabetes Father   . Hypertension Father   . Bipolar disorder Sister   . Drug abuse Sister   . Alcohol abuse Sister    Social History:  History   Social History  . Marital Status: Single    Spouse Name: N/A  . Number of Children: N/A  . Years of Education: N/A   Social History Main Topics  . Smoking status: Former Smoker    Types: Cigarettes    Quit date: 05/27/2014  . Smokeless tobacco: Never Used   . Alcohol Use: 0.0 oz/week    0 Standard drinks or equivalent per week     Comment: occasional  . Drug Use: No  . Sexual Activity: Yes    Birth Control/ Protection: Pill   Other Topics Concern  . None   Social History Narrative   Additional History:  She currently works in Therapist, art.  Assessment:   Musculoskeletal: Strength & Muscle Tone: within normal limits Gait & Station: normal Patient leans: N/A  Psychiatric Specialty Exam: HPI  Review of Systems  Constitutional: Negative for weight loss.  HENT: Negative for ear discharge and hearing loss.   Eyes: Negative for pain.  Respiratory: Negative for stridor.   Cardiovascular: Negative for orthopnea.  Gastrointestinal: Negative for nausea.  Neurological: Negative for sensory change.  Psychiatric/Behavioral: Negative for suicidal ideas and substance abuse. The patient is nervous/anxious.     Blood pressure 136/82, pulse 86, temperature 98.9 F (37.2 C), temperature source Tympanic, height 5' 0.5" (1.537 m), weight 237 lb 0.2 oz (107.508 kg), last menstrual period 07/28/2014, SpO2 98 %.Body mass index is 45.51 kg/(m^2).  General Appearance: Casual  Eye Contact:  Fair  Speech:  Normal Rate  Volume:  Normal  Mood:  Anxious  Affect:  Congruent  Thought Process:  Coherent  Orientation:  Full (Time, Place, and Person)  Thought Content:  WDL  Suicidal  Thoughts:  No  Homicidal Thoughts:  No  Memory:  Immediate;   Fair  Judgement:  Intact  Insight:  Fair  Psychomotor Activity:  Normal  Concentration:  Fair  Recall:  [employer] of Knowledge: Fair  Language: Fair  Akathisia:  No  Handed:  Right  AIMS (if indicated):  none  Assets:  Communication Skills Desire for Improvement Physical Health Social Support  ADL's:  Intact  Cognition: WNL  Sleep:  3-4    Is the patient at risk to self?  No. Has the patient been a risk to self in the past 6 months?  No. Has the patient been a risk to self within the distant  past?  No. Is the patient a risk to others?  No. Has the patient been a risk to others in the past 6 months?  No. Has the patient been a risk to others within the distant past?  No.  Current Medications: Current Outpatient Prescriptions  Medication Sig Dispense Refill  . busPIRone (BUSPAR) 10 MG tablet Take 1 tablet by mouth 2 (two) times daily.   0  . Butalbital-APAP-Caffeine 50-300-40 MG CAPS Take by mouth.    . diazepam (VALIUM) 10 MG tablet   1  . lamoTRIgine (LAMICTAL) 100 MG tablet Take 1 tablet (100 mg total) by mouth daily. 20 tablet 0  . sertraline (ZOLOFT) 50 MG tablet   0  . TRI-SPRINTEC 0.18/0.215/0.25 MG-35 MCG tablet   10  . busPIRone (BUSPAR) 10 MG tablet Take by mouth.    . busPIRone (BUSPAR) 30 MG tablet   0  . lamoTRIgine (LAMICTAL) 100 MG tablet   1  . lamoTRIgine (LAMICTAL) 25 MG tablet   0  . predniSONE (DELTASONE) 10 MG tablet 5,4,3,2,1 - take with food (Patient not taking: Reported on 08/26/2014) 15 tablet 0  . PRESCRIPTION MEDICATION Take 1 tablet by mouth daily.    . promethazine-dextromethorphan (PROMETHAZINE-DM) 6.25-15 MG/5ML syrup Take 120 mLs by mouth every 6 (six) hours. (Patient not taking: Reported on 08/26/2014) 118 mL 0  . pseudoephedrine (SUDAFED 12 HOUR) 120 MG 12 hr tablet Take 1 tablet (120 mg total) by mouth every 12 (twelve) hours. (Patient not taking: Reported on 08/26/2014) 20 tablet 0  . sertraline (ZOLOFT) 100 MG tablet   0   No current facility-administered medications for this visit.    Medical Decision Making:  Established Problem, Stable/Improving (1), Review of Psycho-Social Stressors (1) and Review of Last Therapy Session (1)  Treatment Plan Summary:Medication management   Discussed with patient about her medications and I will titrate the dose of lamotrigine 150 mg by mouth daily. She will continue on Zoloft 50 mg by mouth daily at bedtime She will continue on buspirone 10 mg by mouth twice a day Discussed with  patient about the  medication risk benefits and alternatives Follow-up in 2 months or earlier depending on her symptoms   More than 50% of the time spent in psychoeducation, counseling and coordination of care.    This note was generated in part or whole with voice recognition software. Voice regonition is usually quite accurate but there are transcription errors that can and very often do occur. I apologize for any typographical errors that were not detected and corrected.    Rainey Pines 08/26/2014, 1:13 PM
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
## 2014-09-13 ENCOUNTER — Telehealth: Payer: Self-pay

## 2014-09-21 ENCOUNTER — Other Ambulatory Visit: Payer: Self-pay

## 2014-09-21 NOTE — Telephone Encounter (Signed)
received a fax pt needs refill on diazepam 10mg   pt last seen on  08-26-14 next appt 10-26-14.

## 2014-09-21 NOTE — Telephone Encounter (Signed)
Not prescribed by this office

## 2014-09-23 ENCOUNTER — Other Ambulatory Visit: Payer: Self-pay

## 2014-09-23 NOTE — Telephone Encounter (Signed)
this is a Dr. Gretel Acre pt.  Pt is aware that Dr. Gretel Acre will not be back until next tuesday.  Can we give patient a rx for 5 tablet that is enough to do until Dr. Gretel Acre comes back to offic?  Then Dr. Gretel Acre can give any addtional refills

## 2014-09-24 ENCOUNTER — Ambulatory Visit: Payer: BLUE CROSS/BLUE SHIELD | Admitting: Licensed Clinical Social Worker

## 2014-09-24 NOTE — Telephone Encounter (Signed)
per dr. Jimmye Norman he will not give rx will have to wait until dr. Gretel Acre returnns on tuesday.

## 2014-09-27 NOTE — Telephone Encounter (Signed)
called the pharmacy states that dr. Hilma Favors in [place] medical order it but pt stated to send it to dr. Gretel Acre to order. told pharmacy to send to dr. Hilma Favors office. and that pt would have to talk to dr. Gretel Acre about takin medication over on her next visit.

## 2014-10-22 ENCOUNTER — Other Ambulatory Visit: Payer: Self-pay

## 2014-10-22 NOTE — Telephone Encounter (Signed)
pt was r/s from 10-26-14 due to dr. Gretel Acre work restrictions  pt states she will need all of medications refill. on valium , zoloft, lamotrigine.

## 2014-10-26 ENCOUNTER — Ambulatory Visit: Payer: BLUE CROSS/BLUE SHIELD | Admitting: Psychiatry

## 2014-10-26 ENCOUNTER — Ambulatory Visit (INDEPENDENT_AMBULATORY_CARE_PROVIDER_SITE_OTHER): Payer: BLUE CROSS/BLUE SHIELD | Admitting: Licensed Clinical Social Worker

## 2014-10-26 DIAGNOSIS — F411 Generalized anxiety disorder: Secondary | ICD-10-CM

## 2014-10-26 DIAGNOSIS — F3181 Bipolar II disorder: Secondary | ICD-10-CM

## 2014-10-26 MED ORDER — SERTRALINE HCL 50 MG PO TABS: 50.0000 mg | ORAL_TABLET | Freq: Every day | ORAL | Status: DC

## 2014-10-26 MED ORDER — BUSPIRONE HCL 10 MG PO TABS: 10.0000 mg | ORAL_TABLET | Freq: Two times a day (BID) | ORAL | Status: DC

## 2014-10-26 MED ORDER — LAMOTRIGINE 150 MG PO TABS: 150.0000 mg | ORAL_TABLET | Freq: Every day | ORAL | Status: DC

## 2014-10-26 NOTE — Progress Notes (Signed)
THERAPIST PROGRESS NOTE  Session Time: 2:25 p.m.- 3:20 p.m.  Participation Level: Active  Behavioral Response: CasualAlertDepressed and Euthymic (Reports that mood swings continue but are more mild)  Type of Therapy: Individual Therapy  Treatment Goals addressed: Anxiety, Communication: decreasing avoidant behaviors and Coping  Interventions: CBT, Solution Focused, Strength-based, Supportive and Family Systems  Summary: Megan Houston is a 24 y.o. female who returns to [facility] after not being seen for several months. Affect is somewhat flat and mood is euthymic with reported anxiety and depression that are improving.  Speech is slow and somewhat disinterested.  "This OCD thing is pissing me off."  She informed LCSW that she spent half of her morning at work "adjusting my monitor to center it."  Megan Houston has been accused of being distracted at work. Ongoing work related issues as a result of her behavioral responses. "I don't know how to leave it alone and I get aggravated."  Client was referring to her computer screens.  In addition to periodic irritability, she notified LCSW that  "I think I eat too much. It's like this all the time."  She stated that she has gained around 30 pounds this year.  Sleep is good, energy described as "meh" and little interest to do things and low energy.  Walks dog a few times per day. "I like to read books."  Socially she talked about how she has avoiding contacts with her father and a few friends and with voiced preference to stay at home.  "My anxiety attacks have gotten better."  She believes that the increase in Lamictal by 50 mg has made a positive difference.  "When I'm low I'm low." Denied SI or SIB.   Continues to have mood swings but they last a much shorter period of time. "They don't last as long." [name] continues using the mood tracker app and we discussed her highs and lows and possible triggers behind these shifts in her mood. "I was low for no reason  and angry because I couldn't get my screens centered the way I want them."  One day received a speeding ticket and felt mad but not for long per her report.  Appropriate frustration voiced about Dr. Anola Gurney appointment being cancelled today.  Client did indicate that her medications have been refilled and are at her pharmacy.  She shared that therapy has been helpful and thanked LCSW stating "I've learned to let go of my family and talk myself through my anxiety."  Megan Houston remains open to LCSW's thoughts and information shared.  Tomorrow will celebrate five year anniversary of being with her boyfriend Megan Houston and although she indicated "We're boring," the two share common interests and goals.  Despite her mother and sister's efforts to break the two up, client indicated that she and boyfriend are a united front and she is not easily persuaded by family's accusations against her boyfriend.  Client and LCSW believe that she has met her initially established therapy goals and client denied new concerns or additional topics to focus on in sessions.  Client is aware that she can return to therapy PRN.  Suicidal/Homicidal: Negativewithout intent/plan  Therapist Response:   LCSW offered education about common irrational fears and beliefs that contribute to anxiety. Reinforced use of and re-framing automatic negative self-talk and thinking errors as a means of increasing client's capacity to handle her anxiety more constructively.  LCSW offered idea to ask a few leading questions to allow the other person to "carry" the conversation and  this would decrease her social isolation while not putting stress on herself to "have" conversations with people in her life.  Suggested that she consider taking a self-screening tool for both ADD and OCD.  Offered psycho-education around the differences between the diagnoses.  Supportive therapy with insight.  Reinforced client's progress made in therapy and urged her to discuss low  energy with Dr. Gretel Acre and with her PCP.  Plan:   Client to return to OPT PRN.  LCSW will coordinate care PRN with Dr. Gretel Acre.  Diagnosis:   Bi-Polar II Disorder, MRE, Depressed   Generalized Anxiety Disorder    Rule Out OCD   Rule Out ADD  Miguel Dibble, LCSW 10/26/2014

## 2014-10-26 NOTE — Telephone Encounter (Signed)
pt states she got all her medications exp for the valium.

## 2014-10-26 NOTE — Telephone Encounter (Signed)
Pt has been Prescribed Zoloft Buspar and Lamotrigine according to my last note. Her medications were refilled.

## 2014-11-09 ENCOUNTER — Ambulatory Visit (INDEPENDENT_AMBULATORY_CARE_PROVIDER_SITE_OTHER): Payer: BLUE CROSS/BLUE SHIELD | Admitting: Psychiatry

## 2014-11-09 ENCOUNTER — Encounter: Payer: Self-pay | Admitting: Psychiatry

## 2014-11-09 VITALS — BP 124/80 | HR 103 | Temp 98.3°F | Ht 65.5 in | Wt 246.4 lb

## 2014-11-09 DIAGNOSIS — F3160 Bipolar disorder, current episode mixed, unspecified: Secondary | ICD-10-CM

## 2014-11-09 MED ORDER — LAMOTRIGINE 150 MG PO TABS: 150.0000 mg | ORAL_TABLET | Freq: Every day | ORAL | Status: DC

## 2014-11-09 MED ORDER — SERTRALINE HCL 50 MG PO TABS: 50.0000 mg | ORAL_TABLET | Freq: Every day | ORAL | Status: DC

## 2014-11-09 MED ORDER — TOPIRAMATE 50 MG PO TABS: 50.0000 mg | ORAL_TABLET | Freq: Every day | ORAL | Status: DC

## 2014-11-09 MED ORDER — BUSPIRONE HCL 10 MG PO TABS: 10.0000 mg | ORAL_TABLET | Freq: Two times a day (BID) | ORAL | Status: DC

## 2014-11-09 NOTE — Progress Notes (Signed)
BH MD/PA/NP OP Progress Note  11/09/2014 1:54 PM Megan Houston  MRN:  [id number]  Subjective:  Patient is a 24 year old moderately obese female with history of bipolar disorder who presented for follow-up appointment. She reported that she is has started improving on her medications and is busy with her work. Patient reported that she continues to have migraine headaches at least 2-3 times per week. She reported that she wants her medications to be adjusted. She currently lives with her boyfriend and they have good relationship. She reported that she wants to lose weight as well. She is sleeping only 3-4 hours at night. She currently denied having any adverse effects of the medications. She has been compliant of her medications. She currently denied having any suicidal ideations or plans.  Chief Complaint:  Chief Complaint    Follow-up; Medication Refill     Visit Diagnosis:     ICD-9-CM ICD-10-CM   1. Bipolar I disorder, most recent episode mixed 296.60 F31.60     Past Medical History:  Past Medical History  Diagnosis Date  . Bipolar disorder   . Anxiety   . Depression    History reviewed. No pertinent past surgical history. Family History:  Family History  Problem Relation Age of Onset  . Hypertension Other   . Diabetes Other   . Hypertension Mother   . Bipolar disorder Mother   . Alcohol abuse Mother   . Drug abuse Mother   . OCD Father   . Anxiety disorder Father   . Diabetes Father   . Hypertension Father   . Bipolar disorder Sister   . Drug abuse Sister   . Alcohol abuse Sister    Social History:  Social History   Social History  . Marital Status: Single    Spouse Name: N/A  . Number of Children: N/A  . Years of Education: N/A   Social History Main Topics  . Smoking status: Former Smoker    Types: Cigarettes    Quit date: 05/27/2014  . Smokeless tobacco: Never Used  . Alcohol Use: 0.0 oz/week    0 Standard drinks or equivalent per week     Comment:  occasional  . Drug Use: No  . Sexual Activity: Yes    Birth Control/ Protection: Pill   Other Topics Concern  . None   Social History Narrative   Additional History:  She currently works in Therapist, art.  Assessment:   Musculoskeletal: Strength & Muscle Tone: within normal limits Gait & Station: normal Patient leans: N/A  Psychiatric Specialty Exam: HPI   Review of Systems  Constitutional: Negative for weight loss.  HENT: Negative for ear discharge and hearing loss.   Eyes: Negative for pain.  Respiratory: Negative for stridor.   Cardiovascular: Negative for orthopnea.  Gastrointestinal: Negative for nausea.  Neurological: Negative for sensory change.  Psychiatric/Behavioral: Negative for suicidal ideas and substance abuse. The patient is nervous/anxious.     Blood pressure 124/80, pulse 103, temperature 98.3 F (36.8 C), temperature source Tympanic, height 5' 5.5" (1.664 m), weight 246 lb 6.4 oz (111.766 kg), last menstrual period 10/19/2014, SpO2 97 %.Body mass index is 40.36 kg/(m^2).  General Appearance: Casual  Eye Contact:  Fair  Speech:  Normal Rate  Volume:  Normal  Mood:  Anxious  Affect:  Congruent  Thought Process:  Coherent  Orientation:  Full (Time, Place, and Person)  Thought Content:  WDL  Suicidal Thoughts:  No  Homicidal Thoughts:  No  Memory:  Immediate;  Fair  Judgement:  Intact  Insight:  Fair  Psychomotor Activity:  Normal  Concentration:  Fair  Recall:  [employer] of Knowledge: Fair  Language: Fair  Akathisia:  No  Handed:  Right  AIMS (if indicated):  none  Assets:  Communication Skills Desire for Improvement Physical Health Social Support  ADL's:  Intact  Cognition: WNL  Sleep:  3-4    Is the patient at risk to self?  No. Has the patient been a risk to self in the past 6 months?  No. Has the patient been a risk to self within the distant past?  No. Is the patient a risk to others?  No. Has the patient been a risk to  others in the past 6 months?  No. Has the patient been a risk to others within the distant past?  No.  Current Medications: Current Outpatient Prescriptions  Medication Sig Dispense Refill  . busPIRone (BUSPAR) 10 MG tablet Take 1 tablet (10 mg total) by mouth 2 (two) times daily. 60 tablet 1  . Butalbital-APAP-Caffeine 50-300-40 MG CAPS Take by mouth.    . diazepam (VALIUM) 10 MG tablet Take 1 tablet by mouth 2 (two) times daily as needed.  1  . lamoTRIgine (LAMICTAL) 150 MG tablet Take 1 tablet (150 mg total) by mouth daily. 30 tablet 1  . sertraline (ZOLOFT) 50 MG tablet Take 1 tablet (50 mg total) by mouth at bedtime. 30 tablet 0  . TRI-SPRINTEC 0.18/0.215/0.25 MG-35 MCG tablet   10   No current facility-administered medications for this visit.    Medical Decision Making:  Established Problem, Stable/Improving (1), Review of Psycho-Social Stressors (1) and Review of Last Therapy Session (1)  Treatment Plan Summary:Medication management   Depression She will continue on Zoloft 50 mg by mouth daily at bedtime She will continue on buspirone 10 mg by mouth twice a day  Mood symptoms  Patient will continue on lamotrigine 150 mg daily and I will also add Topamax 50 mg to help with mood stabilization and weight loss.   Discussed with  patient about the medication risk benefits and alternatives Follow-up Follow-up in 1 months or earlier depending on her symptoms   More than 50% of the time spent in psychoeducation, counseling and coordination of care.    This note was generated in part or whole with voice recognition software. Voice regonition is usually quite accurate but there are transcription errors that can and very often do occur. I apologize for any typographical errors that were not detected and corrected.    Rainey Pines 11/09/2014, 1:54 PM

## 2014-12-07 ENCOUNTER — Ambulatory Visit: Payer: BLUE CROSS/BLUE SHIELD | Admitting: Psychiatry

## 2014-12-09 ENCOUNTER — Ambulatory Visit (INDEPENDENT_AMBULATORY_CARE_PROVIDER_SITE_OTHER): Payer: BLUE CROSS/BLUE SHIELD | Admitting: Psychiatry

## 2014-12-09 ENCOUNTER — Encounter: Payer: Self-pay | Admitting: Psychiatry

## 2014-12-09 VITALS — BP 122/78 | HR 82 | Temp 98.8°F | Ht 65.5 in | Wt 244.6 lb

## 2014-12-09 DIAGNOSIS — F3160 Bipolar disorder, current episode mixed, unspecified: Secondary | ICD-10-CM

## 2014-12-09 MED ORDER — TOPIRAMATE 100 MG PO TABS: 100.0000 mg | ORAL_TABLET | Freq: Every day | ORAL | Status: DC

## 2014-12-09 MED ORDER — TOPIRAMATE 50 MG PO TABS: 100.0000 mg | ORAL_TABLET | Freq: Every day | ORAL | Status: DC

## 2014-12-09 MED ORDER — SERTRALINE HCL 50 MG PO TABS: 50.0000 mg | ORAL_TABLET | Freq: Every day | ORAL | Status: DC

## 2014-12-09 MED ORDER — BUSPIRONE HCL 10 MG PO TABS: 10.0000 mg | ORAL_TABLET | Freq: Every morning | ORAL | Status: DC

## 2014-12-09 MED ORDER — LAMOTRIGINE 150 MG PO TABS: 150.0000 mg | ORAL_TABLET | Freq: Every day | ORAL | Status: DC

## 2014-12-09 NOTE — Progress Notes (Signed)
[place] MD/PA/NP OP Progress Note  12/09/2014 11:51 AM Megan Houston  MRN:  [id number]  Subjective:  Patient is a 24 year old  female with history of bipolar disorder who presented for follow-up appointment. She reported that she is having mood swings as she is currently having premenstrual symptoms. She reported that she usually gets more swings around her menstrual cycle. Patient reported that she has been compliant with her medications. She reported that she has been taking Topamax on a regular basis and initially she had some symptoms including tingling but she is doing well on the medication. She reported that she has lost a few pounds and is interested in continuing the medication to help her lose weight. She denied having any adverse event of medication. She currently denied having any suicidal ideations or plans.  Chief Complaint:  Chief Complaint    Follow-up; Medication Refill     Visit Diagnosis:     ICD-9-CM ICD-10-CM   1. Bipolar I disorder, most recent episode mixed ([place]) 296.60 F31.60     Past Medical History:  Past Medical History  Diagnosis Date  . Bipolar disorder ([place])   . Anxiety   . Depression    History reviewed. No pertinent past surgical history. Family History:  Family History  Problem Relation Age of Onset  . Hypertension Other   . Diabetes Other   . Hypertension Mother   . Bipolar disorder Mother   . Alcohol abuse Mother   . Drug abuse Mother   . OCD Father   . Anxiety disorder Father   . Diabetes Father   . Hypertension Father   . Bipolar disorder Sister   . Drug abuse Sister   . Alcohol abuse Sister    Social History:  Social History   Social History  . Marital Status: Single    Spouse Name: N/A  . Number of Children: N/A  . Years of Education: N/A   Social History Main Topics  . Smoking status: Former Smoker    Types: Cigarettes    Quit date: 05/27/2014  . Smokeless tobacco: Never Used  . Alcohol Use: 0.0 oz/week    0 Standard drinks  or equivalent per week     Comment: occasional  . Drug Use: No  . Sexual Activity: Yes    Birth Control/ Protection: Pill   Other Topics Concern  . None   Social History Narrative   Additional History:  She currently works in Therapist, art.  Assessment:   Musculoskeletal: Strength & Muscle Tone: within normal limits Gait & Station: normal Patient leans: N/A  Psychiatric Specialty Exam: HPI   Review of Systems  Constitutional: Negative for weight loss.  HENT: Negative for ear discharge and hearing loss.   Eyes: Negative for pain.  Respiratory: Negative for stridor.   Cardiovascular: Negative for orthopnea.  Gastrointestinal: Negative for nausea.  Neurological: Negative for sensory change.  Psychiatric/Behavioral: Negative for suicidal ideas and substance abuse. The patient is nervous/anxious.     Blood pressure 122/78, pulse 82, temperature 98.8 F (37.1 C), temperature source Tympanic, height 5' 5.5" (1.664 m), weight 244 lb 9.6 oz (110.95 kg), last menstrual period 11/20/2014, SpO2 98 %.Body mass index is 40.07 kg/(m^2).  General Appearance: Casual  Eye Contact:  Fair  Speech:  Normal Rate  Volume:  Normal  Mood:  Anxious  Affect:  Congruent  Thought Process:  Coherent  Orientation:  Full (Time, Place, and Person)  Thought Content:  WDL  Suicidal Thoughts:  No  Homicidal Thoughts:  No  Memory:  Immediate;   Fair  Judgement:  Intact  Insight:  Fair  Psychomotor Activity:  Normal  Concentration:  Fair  Recall:  [employer] of Knowledge: Fair  Language: Fair  Akathisia:  No  Handed:  Right  AIMS (if indicated):  none  Assets:  Communication Skills Desire for Improvement Physical Health Social Support  ADL's:  Intact  Cognition: WNL  Sleep:  3-4    Is the patient at risk to self?  No. Has the patient been a risk to self in the past 6 months?  No. Has the patient been a risk to self within the distant past?  No. Is the patient a risk to others?   No. Has the patient been a risk to others in the past 6 months?  No. Has the patient been a risk to others within the distant past?  No.  Current Medications: Current Outpatient Prescriptions  Medication Sig Dispense Refill  . busPIRone (BUSPAR) 10 MG tablet Take 1 tablet (10 mg total) by mouth 2 (two) times daily. 60 tablet 1  . Butalbital-APAP-Caffeine 50-300-40 MG CAPS Take by mouth.    . lamoTRIgine (LAMICTAL) 150 MG tablet Take 1 tablet (150 mg total) by mouth daily. 30 tablet 1  . sertraline (ZOLOFT) 50 MG tablet Take 1 tablet (50 mg total) by mouth at bedtime. 30 tablet 1  . topiramate (TOPAMAX) 50 MG tablet Take 1 tablet (50 mg total) by mouth daily. 30 tablet 1  . TRI-SPRINTEC 0.18/0.215/0.25 MG-35 MCG tablet   10   No current facility-administered medications for this visit.    Medical Decision Making:  Established Problem, Stable/Improving (1), Review of Psycho-Social Stressors (1) and Review of Last Therapy Session (1)  Treatment Plan Summary:Medication management   Depression She will continue on Zoloft 50 mg by mouth daily at bedtime She will continue on buspirone 10 mg by mouth daily.   Mood symptoms  Patient will continue on lamotrigine 150 mg daily and I will also add Topamax 100 mg to help with mood stabilization and weight loss.   Discussed with  patient about the medication risk benefits and alternatives Follow-up Follow-up in 2 months or earlier depending on her symptoms   More than 50% of the time spent in psychoeducation, counseling and coordination of care.    This note was generated in part or whole with voice recognition software. Voice regonition is usually quite accurate but there are transcription errors that can and very often do occur. I apologize for any typographical errors that were not detected and corrected.    Rainey Pines 12/09/2014, 11:51 AM

## 2015-01-19 NOTE — Telephone Encounter (Signed)
Still taking - refilled

## 2015-03-14 ENCOUNTER — Other Ambulatory Visit: Payer: Self-pay

## 2015-03-14 MED ORDER — TOPIRAMATE 100 MG PO TABS: 100.0000 mg | ORAL_TABLET | Freq: Every day | ORAL | Status: DC

## 2015-03-14 NOTE — Telephone Encounter (Signed)
receieved a request for topiramate 100mg  . pt last seen on  12-07-14 next appt  04-12-15

## 2015-03-28 ENCOUNTER — Other Ambulatory Visit: Payer: Self-pay

## 2015-03-28 MED ORDER — LAMOTRIGINE 150 MG PO TABS: 150.0000 mg | ORAL_TABLET | Freq: Every day | ORAL | Status: DC

## 2015-03-28 NOTE — Telephone Encounter (Signed)
received a faxe requesting refill on lamotrigine 150mg . pt last seen on  12-09-14 next appt 04-12-15

## 2015-03-29 ENCOUNTER — Other Ambulatory Visit: Payer: Self-pay

## 2015-03-29 MED ORDER — BUSPIRONE HCL 10 MG PO TABS
10.0000 mg | ORAL_TABLET | Freq: Every morning | ORAL | Status: DC
Start: 2015-03-29 — End: 2015-04-12

## 2015-03-29 NOTE — Telephone Encounter (Signed)
received a fax requesting refill on buspirone 10mg  pt was last seen on 12-09-14 next appt 04-11-14 pt will be short medications

## 2015-04-12 ENCOUNTER — Encounter: Payer: Self-pay | Admitting: Psychiatry

## 2015-04-12 ENCOUNTER — Ambulatory Visit (INDEPENDENT_AMBULATORY_CARE_PROVIDER_SITE_OTHER): Payer: BLUE CROSS/BLUE SHIELD | Admitting: Psychiatry

## 2015-04-12 VITALS — BP 122/68 | HR 98 | Temp 97.8°F | Ht 65.5 in | Wt 245.6 lb

## 2015-04-12 DIAGNOSIS — F3181 Bipolar II disorder: Secondary | ICD-10-CM

## 2015-04-12 DIAGNOSIS — F411 Generalized anxiety disorder: Secondary | ICD-10-CM

## 2015-04-12 MED ORDER — TOPIRAMATE 100 MG PO TABS: 100.0000 mg | ORAL_TABLET | Freq: Every day | ORAL | Status: DC

## 2015-04-12 MED ORDER — SERTRALINE HCL 50 MG PO TABS: 50.0000 mg | ORAL_TABLET | Freq: Every day | ORAL | Status: DC

## 2015-04-12 MED ORDER — LAMOTRIGINE 150 MG PO TABS: 150.0000 mg | ORAL_TABLET | Freq: Every day | ORAL | Status: DC

## 2015-04-12 MED ORDER — BUSPIRONE HCL 10 MG PO TABS: 10.0000 mg | ORAL_TABLET | Freq: Every morning | ORAL | Status: DC

## 2015-04-12 NOTE — Progress Notes (Signed)
[facility] MD/PA/NP OP Progress Note  04/12/2015 11:35 AM DILYNN POLLNOW  MRN:  [id number]  Subjective:  Patient is a 25 year old  female with history of bipolar disorder who presented for follow-up appointment. She reported that she is doing much better and has been compliant with her medications. Patient reported that she has started losing weight and she is excited about the same as she has lost a few pounds since her last appointment. Patient reported that she has started working at her home and will cook for herself as well as for her boyfriend. Patient reported that she feels healthy and has more energy. Patient reported that she is not experiencing any adverse effects of the medications. She currently denied having any suicidal homicidal ideations or plans. She reported that the medications are very effective and she wants to continue the medications as prescribed. She is not experiencing any headaches as well. She appeared alert and oriented during the interview. She currently denied having any suicidal ideations or plans.  Chief Complaint:  Chief Complaint    Follow-up; Medication Refill     Visit Diagnosis:     ICD-9-CM ICD-10-CM   1. Bipolar 2 disorder (HCC) 296.89 F31.81   2. Generalized anxiety disorder 300.02 F41.1     Past Medical History:  Past Medical History  Diagnosis Date  . Bipolar disorder ([place])   . Anxiety   . Depression    History reviewed. No pertinent past surgical history. Family History:  Family History  Problem Relation Age of Onset  . Hypertension Other   . Diabetes Other   . Hypertension Mother   . Bipolar disorder Mother   . Alcohol abuse Mother   . Drug abuse Mother   . OCD Father   . Anxiety disorder Father   . Diabetes Father   . Hypertension Father   . Bipolar disorder Sister   . Drug abuse Sister   . Alcohol abuse Sister    Social History:  Social History   Social History  . Marital Status: Single    Spouse Name: N/A  . Number of Children:  N/A  . Years of Education: N/A   Social History Main Topics  . Smoking status: Former Smoker    Types: Cigarettes    Quit date: 05/27/2014  . Smokeless tobacco: Never Used  . Alcohol Use: 0.0 oz/week    0 Standard drinks or equivalent per week     Comment: occasional  . Drug Use: No  . Sexual Activity: Yes    Birth Control/ Protection: Pill   Other Topics Concern  . None   Social History Narrative   Additional History:  She currently works in Therapist, art.  Assessment:   Musculoskeletal: Strength & Muscle Tone: within normal limits Gait & Station: normal Patient leans: N/A  Psychiatric Specialty Exam: HPI   Review of Systems  Constitutional: Negative for weight loss.  HENT: Negative for ear discharge and hearing loss.   Eyes: Negative for pain.  Respiratory: Negative for stridor.   Cardiovascular: Negative for orthopnea.  Gastrointestinal: Negative for nausea.  Neurological: Negative for sensory change.  Psychiatric/Behavioral: Negative for suicidal ideas and substance abuse. The patient is nervous/anxious.     Blood pressure 122/68, pulse 98, temperature 97.8 F (36.6 C), temperature source Tympanic, height 5' 5.5" (1.664 m), weight 245 lb 9.6 oz (111.403 kg), last menstrual period 04/08/2015, SpO2 97 %.Body mass index is 40.23 kg/(m^2).  General Appearance: Casual  Eye Contact:  Fair  Speech:  Normal Rate  Volume:  Normal  Mood:  Anxious  Affect:  Congruent  Thought Process:  Coherent  Orientation:  Full (Time, Place, and Person)  Thought Content:  WDL  Suicidal Thoughts:  No  Homicidal Thoughts:  No  Memory:  Immediate;   Fair  Judgement:  Intact  Insight:  Fair  Psychomotor Activity:  Normal  Concentration:  Fair  Recall:  [employer] of Knowledge: Fair  Language: Fair  Akathisia:  No  Handed:  Right  AIMS (if indicated):  none  Assets:  Communication Skills Desire for Improvement Physical Health Social Support  ADL's:  Intact   Cognition: WNL  Sleep:  3-4     Current Medications: Current Outpatient Prescriptions  Medication Sig Dispense Refill  . amoxicillin (AMOXIL) 875 MG tablet   0  . busPIRone (BUSPAR) 10 MG tablet Take 1 tablet (10 mg total) by mouth every morning. 30 tablet 0  . Butalbital-APAP-Caffeine 50-300-40 MG CAPS Take by mouth.    . lamoTRIgine (LAMICTAL) 150 MG tablet Take 1 tablet (150 mg total) by mouth daily. 30 tablet 0  . sertraline (ZOLOFT) 50 MG tablet Take 1 tablet (50 mg total) by mouth at bedtime. 30 tablet 2  . topiramate (TOPAMAX) 100 MG tablet Take 1 tablet (100 mg total) by mouth daily. 30 tablet 0  . TRI-SPRINTEC 0.18/0.215/0.25 MG-35 MCG tablet   10   No current facility-administered medications for this visit.    Medical Decision Making:  Established Problem, Stable/Improving (1), Review of Psycho-Social Stressors (1) and Review of Last Therapy Session (1)  Treatment Plan Summary:Medication management   Depression She will continue on Zoloft 50 mg by mouth daily at bedtime. 90 day supply of the medication given She will continue on buspirone 10 mg by mouth daily. 90 day supply of the medication given  Mood symptoms  Patient will continue on lamotrigine 150 mg daily . 90 day supply of the medication given   Topamax 100 mg to help with mood stabilization and weight loss. 90 day supply of the medication given  Discussed with  patient about the medication risk benefits and alternatives  Follow-up Follow-up in 3 months or earlier depending on her symptoms   More than 50% of the time spent in psychoeducation, counseling and coordination of care.    This note was generated in part or whole with voice recognition software. Voice regonition is usually quite accurate but there are transcription errors that can and very often do occur. I apologize for any typographical errors that were not detected and corrected.   Rainey Pines, MD  04/12/2015, 11:35 AM

## 2015-07-07 ENCOUNTER — Ambulatory Visit: Payer: Self-pay | Admitting: Psychiatry

## 2016-12-27 ENCOUNTER — Telehealth (HOSPITAL_COMMUNITY): Payer: Self-pay | Admitting: *Deleted

## 2016-12-27 NOTE — Telephone Encounter (Signed)
returned phone call to patient who asked to be seen in [place] due to she moved to [place].   Spoke with patient and she asked if we had anything before 01/14/17.  When told that no slots are available before that day, she said that's ok and hung up

## 2017-01-14 ENCOUNTER — Ambulatory Visit (INDEPENDENT_AMBULATORY_CARE_PROVIDER_SITE_OTHER): Payer: 59 | Admitting: Psychiatry

## 2017-01-14 ENCOUNTER — Encounter: Payer: Self-pay | Admitting: Psychiatry

## 2017-01-14 VITALS — BP 136/87 | HR 73 | Ht 66.0 in | Wt 200.0 lb

## 2017-01-14 DIAGNOSIS — F3181 Bipolar II disorder: Secondary | ICD-10-CM

## 2017-01-14 DIAGNOSIS — F411 Generalized anxiety disorder: Secondary | ICD-10-CM | POA: Diagnosis not present

## 2017-01-14 MED ORDER — LAMOTRIGINE 25 MG PO TABS: ORAL_TABLET | ORAL | 1 refills | Status: DC

## 2017-01-14 MED ORDER — BUSPIRONE HCL 10 MG PO TABS: 10.0000 mg | ORAL_TABLET | Freq: Every morning | ORAL | 1 refills | Status: DC

## 2017-01-14 NOTE — Progress Notes (Signed)
[facility] MD/PA/NP OP Progress Note  01/14/2017 11:53 AM Megan Houston  MRN:  [id number]  Subjective:  Patient is a 26 year-old  female with history of bipolar disorder who presented for follow-up appointment. She was last seen in family. Patient reported that she lost her job and she also stopped taking the medications since then. She stated that she has started having rage attacks as well as increased anxiety as her boyfriend's brother moved in with them. She reported that she feels very angry and agitated at times. She also has problems with one of the colleagues  who has ADHD and he keeps tapping  on his desk. She reported that she wants to restart her medications. Patient reported that Zoloft was causing her sexual side effects and she does not want to take the medication. She has also lost weight since her last appointment. We discussed about the medications in detail. She is willing to restart the lamotrigine at this time. She denied having any suicidal homicidal ideations or plans. She denied having any perceptual disturbances.      Chief Complaint:   Visit Diagnosis:     ICD-10-CM   1. Bipolar 2 disorder (HCC) F31.81   2. Generalized anxiety disorder F41.1     Past Medical History:  Past Medical History:  Diagnosis Date  . Anxiety   . Bipolar disorder ([place])   . Depression    History reviewed. No pertinent surgical history. Family History:  Family History  Problem Relation Age of Onset  . Hypertension Other   . Diabetes Other   . Hypertension Mother   . Bipolar disorder Mother   . Alcohol abuse Mother   . Drug abuse Mother   . OCD Father   . Anxiety disorder Father   . Diabetes Father   . Hypertension Father   . Bipolar disorder Sister   . Drug abuse Sister   . Alcohol abuse Sister    Social History:  Social History   Socioeconomic History  . Marital status: Single    Spouse name: None  . Number of children: None  . Years of education: None  . Highest education  level: None  Social Needs  . Financial resource strain: None  . Food insecurity - worry: None  . Food insecurity - inability: None  . Transportation needs - medical: None  . Transportation needs - non-medical: None  Occupational History  . None  Tobacco Use  . Smoking status: Former Smoker    Types: Cigarettes    Last attempt to quit: 05/27/2014    Years since quitting: 2.6  . Smokeless tobacco: Never Used  Substance and Sexual Activity  . Alcohol use: Yes    Alcohol/week: 0.0 oz    Comment: occasional  . Drug use: No  . Sexual activity: Yes    Birth control/protection: Pill  Other Topics Concern  . None  Social History Narrative  . None   Additional History:  She currently works in Therapist, art.  Assessment:   Musculoskeletal: Strength & Muscle Tone: within normal limits Gait & Station: normal Patient leans: N/A  Psychiatric Specialty Exam: HPI  Review of Systems  Constitutional: Negative for weight loss.  HENT: Negative for ear discharge and hearing loss.   Eyes: Negative for pain.  Respiratory: Negative for stridor.   Cardiovascular: Negative for orthopnea.  Gastrointestinal: Negative for nausea.  Neurological: Negative for sensory change.  Psychiatric/Behavioral: Negative for substance abuse and suicidal ideas. The patient is nervous/anxious.  Blood pressure 136/87, pulse 73, height 5\' 6"  (1.676 m), weight 200 lb (90.7 kg).Body mass index is 32.28 kg/m.  General Appearance: Casual  Eye Contact:  Fair  Speech:  Normal Rate  Volume:  Normal  Mood:  Anxious  Affect:  Congruent  Thought Process:  Coherent  Orientation:  Full (Time, Place, and Person)  Thought Content:  WDL  Suicidal Thoughts:  No  Homicidal Thoughts:  No  Memory:  Immediate;   Fair  Judgement:  Intact  Insight:  Fair  Psychomotor Activity:  Normal  Concentration:  Fair  Recall:  [employer] of Knowledge: Fair  Language: Fair  Akathisia:  No  Handed:  Right  AIMS (if  indicated):  none  Assets:  Communication Skills Desire for Improvement Physical Health Social Support  ADL's:  Intact  Cognition: WNL  Sleep:  3-4     Current Medications: Current Outpatient Medications  Medication Sig Dispense Refill  . TRI-SPRINTEC 0.18/0.215/0.25 MG-35 MCG tablet   10  . busPIRone (BUSPAR) 10 MG tablet Take 1 tablet (10 mg total) by mouth every morning. 30 tablet 1  . lamoTRIgine (LAMICTAL) 25 MG tablet 25mg  po qdaily x 2 weeks then 50mg  po qdaily. 60 tablet 1   No current facility-administered medications for this visit.     Medical Decision Making:  Established Problem, Stable/Improving (1), Review of Psycho-Social Stressors (1) and Review of Last Therapy Session (1)  Treatment Plan Summary:Medication management   I will restart her on BuSpar 10 mg daily for anxiety.  I will restart her on lamotrigine 25 mg daily for 2 weeks and then titrate the dose to 50 mg  daily. Discussed with her about the side effects and she agreed with the plan. She is aware of the risk of rash including Katherina Right syndrome.   Follow-up in 1 months or earlier depending on her symptoms   More than 50% of the time spent in psychoeducation, counseling and coordination of care.    This note was generated in part or whole with voice recognition software. Voice regonition is usually quite accurate but there are transcription errors that can and very often do occur. I apologize for any typographical errors that were not detected and corrected.   Rainey Pines, MD  01/14/2017, 11:53 AM

## 2017-01-28 ENCOUNTER — Ambulatory Visit: Payer: Self-pay | Admitting: Psychiatry

## 2017-02-13 ENCOUNTER — Other Ambulatory Visit: Payer: Self-pay | Admitting: Psychiatry

## 2017-03-11 ENCOUNTER — Ambulatory Visit: Payer: 59 | Admitting: Psychiatry

## 2017-03-11 ENCOUNTER — Encounter: Payer: Self-pay | Admitting: Psychiatry

## 2017-03-11 ENCOUNTER — Other Ambulatory Visit: Payer: Self-pay

## 2017-03-11 VITALS — BP 129/81 | HR 76 | Temp 97.7°F | Wt 160.8 lb

## 2017-03-11 DIAGNOSIS — F3181 Bipolar II disorder: Secondary | ICD-10-CM | POA: Diagnosis not present

## 2017-03-11 DIAGNOSIS — F411 Generalized anxiety disorder: Secondary | ICD-10-CM

## 2017-03-11 MED ORDER — BUSPIRONE HCL 10 MG PO TABS: 10.0000 mg | ORAL_TABLET | Freq: Every morning | ORAL | 1 refills | Status: DC

## 2017-03-11 MED ORDER — LAMOTRIGINE 25 MG PO TABS: 50.0000 mg | ORAL_TABLET | Freq: Every day | ORAL | 1 refills | Status: DC

## 2017-03-11 MED ORDER — GABAPENTIN 100 MG PO CAPS: 100.0000 mg | ORAL_CAPSULE | Freq: Every day | ORAL | 1 refills | Status: DC

## 2017-03-11 NOTE — Progress Notes (Signed)
[facility] MD/PA/NP OP Progress Note  03/11/2017 11:48 AM Megan Houston  MRN:  [id number]  Subjective:  Patient is a 27 year-old  female with history of bipolar disorder who presented for follow-up appointment.. Patient reported that she has noticed improvement in her symptoms since she was started on the medications. Patient reported that she has some days attacks especially related to her here as she is very sensitive in her skull. Patient reported that she also has history of migraine headaches and she has tried Topamax in the past but it causes her tingling in her legs and feet and she does not want to take the same medication again. We discussed about starting her on the gabapentin and she agreed with the plan. She reported that the lamotrigine is helpful. She is compliant with her medications. She is working well at her job and enjoys time with her boyfriend. She denied having any suicidal homicidal ideations or plans. She denied having any perceptual disturbances. She appeared calm and alert during the interview.       Chief Complaint:  Chief Complaint    Follow-up; Medication Refill     Visit Diagnosis:     ICD-10-CM   1. Bipolar 2 disorder (HCC) F31.81   2. Generalized anxiety disorder F41.1     Past Medical History:  Past Medical History:  Diagnosis Date  . Anxiety   . Bipolar disorder ([place])   . Depression    History reviewed. No pertinent surgical history. Family History:  Family History  Problem Relation Age of Onset  . Hypertension Other   . Diabetes Other   . Hypertension Mother   . Bipolar disorder Mother   . Alcohol abuse Mother   . Drug abuse Mother   . OCD Father   . Anxiety disorder Father   . Diabetes Father   . Hypertension Father   . Bipolar disorder Sister   . Drug abuse Sister   . Alcohol abuse Sister    Social History:  Social History   Socioeconomic History  . Marital status: Single    Spouse name: None  . Number of children: None  . Years of  education: None  . Highest education level: None  Social Needs  . Financial resource strain: None  . Food insecurity - worry: None  . Food insecurity - inability: None  . Transportation needs - medical: None  . Transportation needs - non-medical: None  Occupational History  . None  Tobacco Use  . Smoking status: Former Smoker    Types: Cigarettes    Last attempt to quit: 05/27/2014    Years since quitting: 2.7  . Smokeless tobacco: Never Used  Substance and Sexual Activity  . Alcohol use: Yes    Alcohol/week: 0.0 oz    Comment: occasional  . Drug use: No  . Sexual activity: Yes    Birth control/protection: Pill  Other Topics Concern  . None  Social History Narrative  . None   Additional History:  She currently works in Therapist, art.  Assessment:   Musculoskeletal: Strength & Muscle Tone: within normal limits Gait & Station: normal Patient leans: N/A  Psychiatric Specialty Exam: Medication Refill  Pertinent negatives include no nausea.    Review of Systems  Constitutional: Negative for weight loss.  HENT: Negative for ear discharge and hearing loss.   Eyes: Negative for pain.  Respiratory: Negative for stridor.   Cardiovascular: Negative for orthopnea.  Gastrointestinal: Negative for nausea.  Neurological: Negative for sensory change.  Psychiatric/Behavioral: Negative for substance abuse and suicidal ideas. The patient is nervous/anxious.     Blood pressure 129/81, pulse 76, temperature 97.7 F (36.5 C), temperature source Oral, weight 160 lb 12.8 oz (72.9 kg).Body mass index is 25.95 kg/m.  General Appearance: Casual  Eye Contact:  Fair  Speech:  Normal Rate  Volume:  Normal  Mood:  Anxious  Affect:  Congruent  Thought Process:  Coherent  Orientation:  Full (Time, Place, and Person)  Thought Content:  WDL  Suicidal Thoughts:  No  Homicidal Thoughts:  No  Memory:  Immediate;   Fair  Judgement:  Intact  Insight:  Fair  Psychomotor Activity:   Normal  Concentration:  Fair  Recall:  [employer] of Knowledge: Fair  Language: Fair  Akathisia:  No  Handed:  Right  AIMS (if indicated):  none  Assets:  Communication Skills Desire for Improvement Physical Health Social Support  ADL's:  Intact  Cognition: WNL  Sleep:  3-4     Current Medications: Current Outpatient Medications  Medication Sig Dispense Refill  . busPIRone (BUSPAR) 10 MG tablet Take 1 tablet (10 mg total) by mouth every morning. 30 tablet 1  . lamoTRIgine (LAMICTAL) 25 MG tablet 25mg  po qdaily x 2 weeks then 50mg  po qdaily. 60 tablet 1  . TRI-SPRINTEC 0.18/0.215/0.25 MG-35 MCG tablet   10   No current facility-administered medications for this visit.     Medical Decision Making:  Established Problem, Stable/Improving (1), Review of Psycho-Social Stressors (1) and Review of Last Therapy Session (1)  Treatment Plan Summary:Medication management    BuSpar 10 mg daily for anxiety.  Continue  lamotrigine 50  mg daily I will start her on Neurontin 100 mg by mouth daily at bedtime and discussed her about the side effects of medication and she demonstrated understanding.  Discussed with her about the side effects and she agreed with the plan. She is aware of the risk of rash including Katherina Right syndrome.   Follow-up in 2 months or earlier depending on her symptoms   More than 50% of the time spent in psychoeducation, counseling and coordination of care.    This note was generated in part or whole with voice recognition software. Voice regonition is usually quite accurate but there are transcription errors that can and very often do occur. I apologize for any typographical errors that were not detected and corrected.   Rainey Pines, MD  03/11/2017, 11:48 AM

## 2017-03-13 ENCOUNTER — Telehealth: Payer: Self-pay | Admitting: Psychiatry

## 2017-03-14 ENCOUNTER — Other Ambulatory Visit: Payer: Self-pay | Admitting: Psychiatry

## 2017-03-14 DIAGNOSIS — F3181 Bipolar II disorder: Secondary | ICD-10-CM

## 2017-03-14 MED ORDER — LAMOTRIGINE 25 MG PO TABS
75.0000 mg | ORAL_TABLET | Freq: Every day | ORAL | 1 refills | Status: DC
Start: 2017-03-14 — End: 2017-05-06

## 2017-03-14 NOTE — Telephone Encounter (Signed)
pt states she does not want to take the gabapentin and that dr. Gretel Acre had said that she was going to increase her lamictal to 75mg  but she did not send it in.

## 2017-03-14 NOTE — Telephone Encounter (Signed)
Pt notified . Pt states thank you so much.

## 2017-03-14 NOTE — Telephone Encounter (Signed)
SENT Lamictal 75 mg to her pharmacy at [facility].

## 2017-03-14 NOTE — Telephone Encounter (Signed)
Sent Lamictal 75 mg to [facility] , [place].

## 2017-03-15 ENCOUNTER — Encounter: Payer: Self-pay | Admitting: Neurology

## 2017-03-18 NOTE — Telephone Encounter (Signed)
Pt was called and left a message that per dr. Gretel Acre she wanted you to stay on the lamictal 50mg  and for her to call our office back.

## 2017-03-18 NOTE — Telephone Encounter (Signed)
Pt was supposed to be on Lamictal 50mg  and Neurontin as she reported she has increased anxiety and I started her on Neurontin for that reason. Please clarify before titrating the dose on pts who are in active treatment.  Thanks.

## 2017-03-21 ENCOUNTER — Telehealth: Payer: Self-pay

## 2017-03-21 NOTE — Telephone Encounter (Signed)
pt called states she going to try the gabapentin

## 2017-04-26 DIAGNOSIS — G43701 Chronic migraine without aura, not intractable, with status migrainosus: Secondary | ICD-10-CM | POA: Diagnosis not present

## 2017-04-26 DIAGNOSIS — G4459 Other complicated headache syndrome: Secondary | ICD-10-CM | POA: Diagnosis not present

## 2017-05-06 ENCOUNTER — Ambulatory Visit: Payer: No Typology Code available for payment source | Admitting: Psychiatry

## 2017-05-06 ENCOUNTER — Encounter: Payer: Self-pay | Admitting: Psychiatry

## 2017-05-06 ENCOUNTER — Other Ambulatory Visit: Payer: Self-pay

## 2017-05-06 VITALS — BP 146/85 | HR 93 | Temp 98.8°F | Wt 195.2 lb

## 2017-05-06 DIAGNOSIS — F3181 Bipolar II disorder: Secondary | ICD-10-CM

## 2017-05-06 DIAGNOSIS — F3481 Disruptive mood dysregulation disorder: Secondary | ICD-10-CM | POA: Diagnosis not present

## 2017-05-06 MED ORDER — LAMOTRIGINE 100 MG PO TABS: 100.0000 mg | ORAL_TABLET | Freq: Every day | ORAL | 1 refills | Status: DC

## 2017-05-06 MED ORDER — ARIPIPRAZOLE 2 MG PO TABS: 2.0000 mg | ORAL_TABLET | Freq: Every day | ORAL | 1 refills | Status: DC

## 2017-05-06 MED ORDER — BUSPIRONE HCL 10 MG PO TABS: 10.0000 mg | ORAL_TABLET | Freq: Two times a day (BID) | ORAL | 1 refills | Status: DC

## 2017-05-06 NOTE — Progress Notes (Signed)
BH MD/PA/NP OP Progress Note  05/06/2017 1:03 PM Megan Houston  MRN:  [id number]  Subjective:  Patient is a 27 year-old  female with history of bipolar disorder who presented for follow-up appointment.. Patient reported that she continues to have anger problems and she has been hitting herself when she becomes angry and impulsive.  Patient reported that she has these episodes at least 2-3 times per week.  She reported that she did not start taking the Neurontin as it made her very tired and she stopped the medications after 2 days.  It was also giving her headaches and caused worsening of her migraine.  She went to the neurologist who gave her Trokendi samples and she was unable to afford the prescription.  Patient reported that her migraines continues to have been every 2-3 times per week.  We discussed about her medications and she stated that the lamotrigine has been helpful.  She is willing to try other medications for her mood symptoms at this time.  Also advised patient other strategies for controlling her anger.  She is compliant with her medications at this time.   She currently denied having any suicidal or homicidal ideations or plans.  She denied using any drugs or alcohol.   Appeared cooperative during the interview.  She is receptive to medication changes.         Chief Complaint:  Chief Complaint    Follow-up; Medication Refill; Anxiety     Visit Diagnosis:     ICD-10-CM   1. DMDD (disruptive mood dysregulation disorder) (HCC) F34.81   2. Bipolar 2 disorder (HCC) F31.81 lamoTRIgine (LAMICTAL) 100 MG tablet    Past Medical History:  Past Medical History:  Diagnosis Date  . Anxiety   . Bipolar disorder ([place])   . Depression    History reviewed. No pertinent surgical history. Family History:  Family History  Problem Relation Age of Onset  . Hypertension Other   . Diabetes Other   . Hypertension Mother   . Bipolar disorder Mother   . Alcohol abuse Mother   .  Drug abuse Mother   . OCD Father   . Anxiety disorder Father   . Diabetes Father   . Hypertension Father   . Bipolar disorder Sister   . Drug abuse Sister   . Alcohol abuse Sister    Social History:  Social History   Socioeconomic History  . Marital status: Single    Spouse name: None  . Number of children: None  . Years of education: None  . Highest education level: None  Social Needs  . Financial resource strain: None  . Food insecurity - worry: None  . Food insecurity - inability: None  . Transportation needs - medical: None  . Transportation needs - non-medical: None  Occupational History  . None  Tobacco Use  . Smoking status: Former Smoker    Types: Cigarettes    Last attempt to quit: 05/27/2014    Years since quitting: 2.9  . Smokeless tobacco: Never Used  Substance and Sexual Activity  . Alcohol use: Yes    Alcohol/week: 0.0 oz    Comment: occasional  . Drug use: No  . Sexual activity: Yes    Birth control/protection: Pill  Other Topics Concern  . None  Social History Narrative  . None   Additional History:  She currently works in Therapist, art.  Assessment:   Musculoskeletal: Strength & Muscle Tone: within normal limits Gait & Station: normal Patient leans: N/A  Psychiatric Specialty Exam: Medication Refill  Pertinent negatives include no nausea.  Anxiety  Symptoms include nervous/anxious behavior. Patient reports no nausea or suicidal ideas.      Review of Systems  Constitutional: Negative for weight loss.  HENT: Negative for ear discharge and hearing loss.   Eyes: Negative for pain.  Respiratory: Negative for stridor.   Cardiovascular: Negative for orthopnea.  Gastrointestinal: Negative for nausea.  Neurological: Negative for sensory change.  Psychiatric/Behavioral: Negative for substance abuse and suicidal ideas. The patient is nervous/anxious.     Blood pressure (!) 146/85, pulse 93, temperature 98.8 F (37.1 C), temperature  source Oral, weight 195 lb 3.2 oz (88.5 kg), last menstrual period 04/19/2017.Body mass index is 31.51 kg/m.  General Appearance: Casual  Eye Contact:  Fair  Speech:  Normal Rate  Volume:  Normal  Mood:  Anxious  Affect:  Congruent  Thought Process:  Coherent  Orientation:  Full (Time, Place, and Person)  Thought Content:  WDL  Suicidal Thoughts:  No  Homicidal Thoughts:  No  Memory:  Immediate;   Fair  Judgement:  Intact  Insight:  Fair  Psychomotor Activity:  Normal  Concentration:  Fair  Recall:  [employer] of Knowledge: Fair  Language: Fair  Akathisia:  No  Handed:  Right  AIMS (if indicated):  none  Assets:  Communication Skills Desire for Improvement Physical Health Social Support  ADL's:  Intact  Cognition: WNL  Sleep:  3-4     Current Medications: Current Outpatient Medications  Medication Sig Dispense Refill  . busPIRone (BUSPAR) 10 MG tablet Take 1 tablet (10 mg total) by mouth 2 (two) times daily. 60 tablet 1  . lamoTRIgine (LAMICTAL) 100 MG tablet Take 1 tablet (100 mg total) by mouth daily. 30 tablet 1  . TRI-SPRINTEC 0.18/0.215/0.25 MG-35 MCG tablet   10  . ARIPiprazole (ABILIFY) 2 MG tablet Take 1 tablet (2 mg total) by mouth daily. 30 tablet 1   No current facility-administered medications for this visit.     Medical Decision Making:  Established Problem, Stable/Improving (1), Review of Psycho-Social Stressors (1) and Review of Last Therapy Session (1)  Treatment Plan Summary:Medication management    BuSpar 10 mg BID  Continue  lamotrigine 100 mg daily I will start her on Abilify 2 mg p.o. daily for her mood symptoms and she agreed with the plan  .   Follow-up in 2 months or earlier depending on her symptoms   More than 50% of the time spent in psychoeducation, counseling and coordination of care.    This note was generated in part or whole with voice recognition software. Voice regonition is usually quite accurate but there are  transcription errors that can and very often do occur. I apologize for any typographical errors that were not detected and corrected.   Rainey Pines, MD  05/06/2017, 1:03 PM

## 2017-05-10 ENCOUNTER — Telehealth: Payer: Self-pay

## 2017-05-10 NOTE — Telephone Encounter (Signed)
This is Dr.Faheem patient , pls let her know.

## 2017-05-10 NOTE — Telephone Encounter (Signed)
pt called left a message on voicemail that she can not take the abilify .  stated she had allergic reaction but did not leave any other details.

## 2017-05-15 NOTE — Telephone Encounter (Signed)
This is a dr. Gretel Acre pt .

## 2017-05-20 DIAGNOSIS — Z6832 Body mass index (BMI) 32.0-32.9, adult: Secondary | ICD-10-CM | POA: Diagnosis not present

## 2017-05-20 DIAGNOSIS — R0982 Postnasal drip: Secondary | ICD-10-CM | POA: Diagnosis not present

## 2017-05-20 DIAGNOSIS — J029 Acute pharyngitis, unspecified: Secondary | ICD-10-CM | POA: Diagnosis not present

## 2017-06-07 DIAGNOSIS — Z1389 Encounter for screening for other disorder: Secondary | ICD-10-CM | POA: Diagnosis not present

## 2017-06-07 DIAGNOSIS — N760 Acute vaginitis: Secondary | ICD-10-CM | POA: Diagnosis not present

## 2017-06-07 DIAGNOSIS — N943 Premenstrual tension syndrome: Secondary | ICD-10-CM | POA: Diagnosis not present

## 2017-06-10 ENCOUNTER — Ambulatory Visit: Payer: 59 | Admitting: Psychiatry

## 2017-06-17 ENCOUNTER — Encounter: Payer: Self-pay | Admitting: Psychiatry

## 2017-06-17 ENCOUNTER — Other Ambulatory Visit: Payer: Self-pay

## 2017-06-17 ENCOUNTER — Ambulatory Visit: Payer: 59 | Admitting: Psychiatry

## 2017-06-17 VITALS — BP 153/86 | HR 91 | Temp 98.5°F | Wt 190.6 lb

## 2017-06-17 DIAGNOSIS — F3481 Disruptive mood dysregulation disorder: Secondary | ICD-10-CM

## 2017-06-17 DIAGNOSIS — F3181 Bipolar II disorder: Secondary | ICD-10-CM | POA: Diagnosis not present

## 2017-06-17 DIAGNOSIS — F3281 Premenstrual dysphoric disorder: Secondary | ICD-10-CM | POA: Diagnosis not present

## 2017-06-17 MED ORDER — FLUOXETINE HCL 10 MG PO CAPS: 10.0000 mg | ORAL_CAPSULE | Freq: Every day | ORAL | 1 refills | Status: DC

## 2017-06-17 MED ORDER — LAMOTRIGINE 100 MG PO TABS: 100.0000 mg | ORAL_TABLET | Freq: Every day | ORAL | 1 refills | Status: DC

## 2017-06-17 MED ORDER — ALPRAZOLAM 0.25 MG PO TABS: 1.0000 mg | ORAL_TABLET | Freq: Every evening | ORAL | 1 refills | Status: DC | PRN

## 2017-06-17 MED ORDER — BUSPIRONE HCL 10 MG PO TABS: 20.0000 mg | ORAL_TABLET | Freq: Every day | ORAL | 1 refills | Status: DC

## 2017-06-17 MED ORDER — ALPRAZOLAM 0.25 MG PO TABS: 0.2500 mg | ORAL_TABLET | Freq: Every evening | ORAL | 1 refills | Status: DC | PRN

## 2017-06-17 NOTE — Progress Notes (Signed)
BH MD/PA/NP OP Progress Note  06/17/2017 10:25 AM Megan Houston  MRN:  [id number]  Subjective:  Patient is a 27 year-old  female with history of bipolar disorder who presented for follow-up appointment.. Patient reported that she stopped taking the Abilify after 1 pill as she felt anxious and reported that she was unable  to sleep at night.  She reported that she continues to have emotional symptoms related to her menstrual cycle every month.  She reported that the symptoms started 2 weeks before her periods.  She discussed that in detail.  She reported that  she is becoming very anxious and agitated.  The other week she is very emotional and has been crying.  She reported that she is concerned that her coworkers might be looking at her.  We discussed about starting her on Prozac and she agreed with the plan.  She is concerned about the weight gain related to the Prozac.  She reported that her OB is also trying to adjust her birth control pills.  She is doing well on the combination of lamotrigine and BuSpar and takes both pills at night.  She has been losing weight as well.  She is happy about her combination of medications at this time.    She currently denied having any suicidal or homicidal ideations or plans.  She denied using any drugs or alcohol.   Appeared cooperative during the interview.  She is receptive to medication changes.         Chief Complaint:  Chief Complaint    Follow-up; Medication Refill     Visit Diagnosis:   No diagnosis found.  Past Medical History:  Past Medical History:  Diagnosis Date  . Anxiety   . Bipolar disorder ([place])   . Depression    History reviewed. No pertinent surgical history. Family History:  Family History  Problem Relation Age of Onset  . Hypertension Other   . Diabetes Other   . Hypertension Mother   . Bipolar disorder Mother   . Alcohol abuse Mother   . Drug abuse Mother   . OCD Father   . Anxiety disorder Father   . Diabetes  Father   . Hypertension Father   . Bipolar disorder Sister   . Drug abuse Sister   . Alcohol abuse Sister    Social History:  Social History   Socioeconomic History  . Marital status: Single    Spouse name: Not on file  . Number of children: Not on file  . Years of education: Not on file  . Highest education level: Not on file  Occupational History  . Not on file  Social Needs  . Financial resource strain: Not on file  . Food insecurity:    Worry: Not on file    Inability: Not on file  . Transportation needs:    Medical: Not on file    Non-medical: Not on file  Tobacco Use  . Smoking status: Former Smoker    Types: Cigarettes    Last attempt to quit: 05/27/2014    Years since quitting: 3.0  . Smokeless tobacco: Never Used  Substance and Sexual Activity  . Alcohol use: Yes    Alcohol/week: 0.0 oz    Comment: occasional  . Drug use: No  . Sexual activity: Yes    Birth control/protection: Pill  Lifestyle  . Physical activity:    Days per week: Not on file    Minutes per session: Not on file  . Stress:  Not on file  Relationships  . Social connections:    Talks on phone: Not on file    Gets together: Not on file    Attends religious service: Not on file    Active member of club or organization: Not on file    Attends meetings of clubs or organizations: Not on file    Relationship status: Not on file  Other Topics Concern  . Not on file  Social History Narrative  . Not on file   Additional History:  She currently works in Therapist, art.  Assessment:   Musculoskeletal: Strength & Muscle Tone: within normal limits Gait & Station: normal Patient leans: N/A  Psychiatric Specialty Exam: Medication Refill  Pertinent negatives include no nausea.  Anxiety  Symptoms include nervous/anxious behavior. Patient reports no nausea or suicidal ideas.      Review of Systems  Constitutional: Negative for weight loss.  HENT: Negative for ear discharge and hearing  loss.   Eyes: Negative for pain.  Respiratory: Negative for stridor.   Cardiovascular: Negative for orthopnea.  Gastrointestinal: Negative for nausea.  Neurological: Negative for sensory change.  Psychiatric/Behavioral: Negative for substance abuse and suicidal ideas. The patient is nervous/anxious.     Blood pressure (!) 153/86, pulse 91, temperature 98.5 F (36.9 C), temperature source Oral, weight 190 lb 9.6 oz (86.5 kg), last menstrual period 06/05/2017.Body mass index is 30.76 kg/m.  General Appearance: Casual  Eye Contact:  Fair  Speech:  Normal Rate  Volume:  Normal  Mood:  Anxious  Affect:  Congruent  Thought Process:  Coherent  Orientation:  Full (Time, Place, and Person)  Thought Content:  WDL  Suicidal Thoughts:  No  Homicidal Thoughts:  No  Memory:  Immediate;   Fair  Judgement:  Intact  Insight:  Fair  Psychomotor Activity:  Normal  Concentration:  Fair  Recall:  [employer] of Knowledge: Fair  Language: Fair  Akathisia:  No  Handed:  Right  AIMS (if indicated):  none  Assets:  Communication Skills Desire for Improvement Physical Health Social Support  ADL's:  Intact  Cognition: WNL  Sleep:  3-4     Current Medications: Current Outpatient Medications  Medication Sig Dispense Refill  . busPIRone (BUSPAR) 10 MG tablet Take 1 tablet (10 mg total) by mouth 2 (two) times daily. 60 tablet 1  . lamoTRIgine (LAMICTAL) 100 MG tablet Take 1 tablet (100 mg total) by mouth daily. 30 tablet 1  . TRI-SPRINTEC 0.18/0.215/0.25 MG-35 MCG tablet   10   No current facility-administered medications for this visit.     Medical Decision Making:  Established Problem, Stable/Improving (1), Review of Psycho-Social Stressors (1) and Review of Last Therapy Session (1)  Treatment Plan Summary:Medication management    BuSpar 20mg  po qhs   Continue  lamotrigine 100 mg daily I will start her on Prozac 10 mg for 2 weeks for her PMDD symptoms. Xanax 0.25 mg half to 1 pill on a  as needed basis and she agreed with the plan. .   Follow-up in 2 months or earlier depending on her symptoms   More than 50% of the time spent in psychoeducation, counseling and coordination of care.    This note was generated in part or whole with voice recognition software. Voice regonition is usually quite accurate but there are transcription errors that can and very often do occur. I apologize for any typographical errors that were not detected and corrected.   Rainey Pines, MD  06/17/2017,  10:25 AM

## 2017-06-20 DIAGNOSIS — J029 Acute pharyngitis, unspecified: Secondary | ICD-10-CM | POA: Diagnosis not present

## 2017-06-25 ENCOUNTER — Encounter (HOSPITAL_COMMUNITY): Payer: Self-pay

## 2017-06-25 ENCOUNTER — Other Ambulatory Visit: Payer: Self-pay

## 2017-06-25 ENCOUNTER — Emergency Department (HOSPITAL_COMMUNITY): Payer: 59

## 2017-06-25 ENCOUNTER — Emergency Department (HOSPITAL_COMMUNITY)
Admission: EM | Admit: 2017-06-25 | Discharge: 2017-06-25 | Disposition: A | Discharge status: Home / Self Care | Payer: 59 | Attending: Emergency Medicine | Admitting: Emergency Medicine

## 2017-06-25 DIAGNOSIS — N3001 Acute cystitis with hematuria: Secondary | ICD-10-CM

## 2017-06-25 DIAGNOSIS — J029 Acute pharyngitis, unspecified: Secondary | ICD-10-CM | POA: Diagnosis not present

## 2017-06-25 DIAGNOSIS — R509 Fever, unspecified: Secondary | ICD-10-CM

## 2017-06-25 DIAGNOSIS — Z87891 Personal history of nicotine dependence: Secondary | ICD-10-CM | POA: Insufficient documentation

## 2017-06-25 DIAGNOSIS — R5383 Other fatigue: Secondary | ICD-10-CM | POA: Insufficient documentation

## 2017-06-25 DIAGNOSIS — M542 Cervicalgia: Secondary | ICD-10-CM

## 2017-06-25 DIAGNOSIS — R519 Headache, unspecified: Secondary | ICD-10-CM

## 2017-06-25 DIAGNOSIS — R51 Headache: Secondary | ICD-10-CM | POA: Insufficient documentation

## 2017-06-25 LAB — COMPREHENSIVE METABOLIC PANEL
ALBUMIN: 3 g/dL — AB (ref 3.5–5.0)
ALK PHOS: 144 U/L — AB (ref 38–126)
ALT: 18 U/L (ref 14–54)
ANION GAP: 9 (ref 5–15)
AST: 13 U/L — AB (ref 15–41)
CO2: 26 mmol/L (ref 22–32)
Calcium: 8.6 mg/dL — ABNORMAL LOW (ref 8.9–10.3)
Chloride: 99 mmol/L — ABNORMAL LOW (ref 101–111)
Creatinine, Ser: 0.73 mg/dL (ref 0.44–1.00)
GFR calc Af Amer: >60 mL/min (ref >60)
GFR calc non Af Amer: >60 mL/min (ref >60)
GLUCOSE: 137 mg/dL — AB (ref 65–99)
POTASSIUM: 3.2 mmol/L — AB (ref 3.5–5.1)
SODIUM: 134 mmol/L — AB (ref 135–145)
Total Bilirubin: 0.7 mg/dL (ref 0.3–1.2)
Total Protein: 6.6 g/dL (ref 6.5–8.1)

## 2017-06-25 LAB — CSF CELL COUNT WITH DIFFERENTIAL
Eosinophils, CSF: 0 % (ref 0–1)
LYMPHS CSF: 50 % (ref 40–80)
Monocyte-Macrophage-Spinal Fluid: 26 % (ref 15–45)
Other Cells, CSF: 0
RBC COUNT CSF: 1 /mm3 — AB
RBC COUNT CSF: 3620 /mm3 — AB
Segmented Neutrophils-CSF: 24 % — ABNORMAL HIGH (ref 0–6)
TUBE #: 1
Tube #: 4
WBC, CSF: 4 /mm3 (ref 0–5)
WBC, CSF: 40 /mm3 (ref 0–5)

## 2017-06-25 LAB — CBC WITH DIFFERENTIAL/PLATELET
Basophils Absolute: 0 10*3/uL (ref 0.0–0.1)
Basophils Relative: 0 %
Eosinophils Absolute: 0 10*3/uL (ref 0.0–0.7)
Eosinophils Relative: 0 %
HEMATOCRIT: 35.6 % — AB (ref 36.0–46.0)
Hemoglobin: 11.5 g/dL — ABNORMAL LOW (ref 12.0–15.0)
LYMPHS PCT: 8 %
Lymphs Abs: 1.9 10*3/uL (ref 0.7–4.0)
MCH: 27.7 pg (ref 26.0–34.0)
MCHC: 32.3 g/dL (ref 30.0–36.0)
MCV: 85.8 fL (ref 78.0–100.0)
MONO ABS: 2 10*3/uL — AB (ref 0.1–1.0)
MONOS PCT: 9 %
NEUTROS ABS: 19.6 10*3/uL — AB (ref 1.7–7.7)
Neutrophils Relative %: 83 %
Platelets: 284 10*3/uL (ref 150–400)
RBC: 4.15 MIL/uL (ref 3.87–5.11)
RDW: 12.6 % (ref 11.5–15.5)
WBC: 23.5 10*3/uL — ABNORMAL HIGH (ref 4.0–10.5)

## 2017-06-25 LAB — URINALYSIS, ROUTINE W REFLEX MICROSCOPIC
Bilirubin Urine: NEGATIVE
GLUCOSE, UA: NEGATIVE mg/dL
Ketones, ur: NEGATIVE mg/dL
Nitrite: NEGATIVE
PROTEIN: NEGATIVE mg/dL
Specific Gravity, Urine: 1.001 — ABNORMAL LOW (ref 1.005–1.030)
pH: 7 (ref 5.0–8.0)

## 2017-06-25 LAB — PREGNANCY, URINE: Preg Test, Ur: NEGATIVE

## 2017-06-25 LAB — GROUP A STREP BY PCR: GROUP A STREP BY PCR: NOT DETECTED

## 2017-06-25 LAB — PROTEIN, CSF: TOTAL PROTEIN, CSF: 14 mg/dL — AB (ref 15–45)

## 2017-06-25 LAB — GLUCOSE, CSF: GLUCOSE CSF: 66 mg/dL (ref 40–70)

## 2017-06-25 LAB — MONONUCLEOSIS SCREEN: Mono Screen: NEGATIVE

## 2017-06-25 LAB — LIPASE, BLOOD: Lipase: 18 U/L (ref 11–51)

## 2017-06-25 MED ORDER — IBUPROFEN 800 MG PO TABS: 800.0000 mg | ORAL_TABLET | Freq: Three times a day (TID) | ORAL | 0 refills | Status: DC | PRN

## 2017-06-25 MED ORDER — FENTANYL CITRATE (PF) 100 MCG/2ML IJ SOLN
50.0000 ug | Freq: Once | INTRAMUSCULAR | Status: AC
  Administered 2017-06-25: 50 ug via INTRAVENOUS
  Filled 2017-06-25: qty 2

## 2017-06-25 MED ORDER — ONDANSETRON 4 MG PO TBDP: 4.0000 mg | ORAL_TABLET | Freq: Three times a day (TID) | ORAL | 0 refills | Status: DC | PRN

## 2017-06-25 MED ORDER — METOCLOPRAMIDE HCL 10 MG PO TABS
10.0000 mg | ORAL_TABLET | Freq: Once | ORAL | Status: AC
  Administered 2017-06-25: 10 mg via ORAL
  Filled 2017-06-25: qty 1

## 2017-06-25 MED ORDER — KETOROLAC TROMETHAMINE 30 MG/ML IJ SOLN
30.0000 mg | Freq: Once | INTRAMUSCULAR | Status: AC
  Administered 2017-06-25: 30 mg via INTRAVENOUS
  Filled 2017-06-25: qty 1

## 2017-06-25 MED ORDER — SODIUM CHLORIDE 0.9 % IV BOLUS
1000.0000 mL | Freq: Once | INTRAVENOUS | Status: AC
  Administered 2017-06-25: 1000 mL via INTRAVENOUS

## 2017-06-25 MED ORDER — CEPHALEXIN 500 MG PO CAPS: 500.0000 mg | ORAL_CAPSULE | Freq: Three times a day (TID) | ORAL | 0 refills | Status: AC

## 2017-06-25 NOTE — Discharge Instructions (Signed)
You have been seen in the Emergency Department (ED) for a headache.  Please use Tylenol or Motrin as needed for symptoms, but only as written on the box.  As we have discussed, please follow up with your primary care doctor as soon as possible regarding today?s Emergency Department (ED) visit and your headache symptoms.    We did not find a clear source for an infection today. You do have some findings that may suggest a urine infection on your lab work. I am starting an antibiotic but will have you to follow up with your PCP in the coming 3-4 days for re-evaluation.   Call your doctor or return to the ED if you have a worsening headache, sudden and severe headache, confusion, slurred speech, facial droop, weakness or numbness in any arm or leg, extreme fatigue, vision problems, or other symptoms that concern you.

## 2017-06-25 NOTE — ED Triage Notes (Signed)
Pt reports has had sore throat, fever, fatigue, neck pain, headache.  Reports symptoms initially started 6 weeks ago.  Has been on antibiotics and steroids since Thursday.  Reports doesn't feel any better.

## 2017-06-25 NOTE — ED Notes (Signed)
CRITICAL VALUE ALERT  Critical Value:  Tube 1 CSF wbc 40 ( bloody tap)  Date & Time Notied:  06/25/2017, 1349  Provider Notified: Dr. Laverta [place]  Orders Received/Actions taken: see chart

## 2017-06-25 NOTE — ED Provider Notes (Signed)
Emergency Department Provider Note   I have reviewed the triage vital signs and the nursing notes.   HISTORY  Chief Complaint Fever and Headache   HPI Megan Houston is a 27 y.o. female with PMH of bipolar and anxiety presents to the emergency department for evaluation of fever, headache, neck pain, sore throat, and fatigue.  Symptoms have been ongoing for the past 6 weeks.  Patient states initially she had sore throat with "pus" on the tonsils.  She was strep negative and diagnosed with viral infection.  Is worsened and her primary care physician started steroids and a Z-Pak.  The steroids seem to improve her sore throat but she continued to have fatigue, intermittent fevers, and headaches. She denies any rash. She does have some mild cough symptoms but denies shortness of breath, runny nose.  No dysuria, hesitancy, urgency.  Denies abdominal or back pain.  No sick contacts.  Patient went to her primary care physician today and was referred to the emergency department for evaluation of continued symptoms.  The patient continues to have headache and neck discomfort but states her neck discomfort is primarily right-sided and worse with turning her neck side to side.  No shooting pains down the middle of the neck or back.    Past Medical History:  Diagnosis Date  . Anxiety   . Bipolar disorder ([place])   . Depression     Patient Active Problem List   Diagnosis Date Noted  . Bipolar 2 disorder ([place]) 07/23/2014  . Anxiety, generalized 07/23/2014  . CARDIAC MURMUR 01/04/2010  . ELEVATED BP READING WITHOUT DX HYPERTENSION 01/04/2010    History reviewed. No pertinent surgical history.  Current Outpatient Rx  . Order #: [id number] Class: Print  . Order #: [id number] Class: Normal  . Order #: [id number] Class: Normal  . Order #: [id number] Class: Normal  . Order #: [id number] Class: Historical Med  . Order #: [id number] Class: Print  . Order #: [id number] Class: Print  . Order #: [id number] Class:  Print    Allergies Codeine and Hydrocodone-acetaminophen  Family History  Problem Relation Age of Onset  . Hypertension Other   . Diabetes Other   . Hypertension Mother   . Bipolar disorder Mother   . Alcohol abuse Mother   . Drug abuse Mother   . OCD Father   . Anxiety disorder Father   . Diabetes Father   . Hypertension Father   . Bipolar disorder Sister   . Drug abuse Sister   . Alcohol abuse Sister     Social History Social History   Tobacco Use  . Smoking status: Former Smoker    Types: Cigarettes    Last attempt to quit: 05/27/2014    Years since quitting: 3.0  . Smokeless tobacco: Never Used  Substance Use Topics  . Alcohol use: Yes    Alcohol/week: 0.0 oz    Comment: occasional  . Drug use: No    Review of Systems  Constitutional: Positive intermittent fever and fatigue.  Eyes: No visual changes. ENT: No sore throat. Cardiovascular: Denies chest pain. Respiratory: Denies shortness of breath. Positive cough.  Gastrointestinal: No abdominal pain.  No nausea, no vomiting.  No diarrhea.  No constipation. Genitourinary: Negative for dysuria. Musculoskeletal: Negative for back pain. Positive right lateral neck pain.  Skin: Negative for rash. Neurological: Negative for focal weakness or numbness. Positive HA.   10-point ROS otherwise negative.  ____________________________________________   PHYSICAL EXAM:  VITAL SIGNS: ED Triage Vitals  Enc Vitals Group  BP 06/25/17 0829 126/78     Pulse Rate 06/25/17 0829 (!) 104     Resp 06/25/17 0829 17     Temp 06/25/17 0829 98.9 F (37.2 C)     Temp Source 06/25/17 0829 Oral     SpO2 06/25/17 0829 97 %     Weight 06/25/17 0828 190 lb (86.2 kg)     Height 06/25/17 0828 5\' 5"  (1.651 m)     Pain Score 06/25/17 0827 9   Constitutional: Alert and oriented. Well appearing and in no acute distress. Eyes: Conjunctivae are normal. PERRL.  Head: Atraumatic. Nose: No congestion/rhinnorhea. Mouth/Throat: Mucous  membranes are moist.  Neck: No stridor.  No meningeal signs.  Cardiovascular: Normal rate, regular rhythm. Good peripheral circulation. Faint systolic murmur noted.  Respiratory: Normal respiratory effort.  No retractions. Lungs CTAB. Gastrointestinal: Soft and nontender. No distention.  Musculoskeletal: No lower extremity tenderness nor edema. No gross deformities of extremities. Neurologic:  Normal speech and language. No gross focal neurologic deficits are appreciated.  Skin:  Skin is warm, dry and intact. No rash noted.  ____________________________________________   LABS (all labs ordered are listed, but only abnormal results are displayed)  Labs Reviewed  COMPREHENSIVE METABOLIC PANEL - Abnormal; Notable for the following components:      Result Value   Sodium 134 (*)    Potassium 3.2 (*)    Chloride 99 (*)    Glucose, Bld 137 (*)    BUN <5 (*)    Calcium 8.6 (*)    Albumin 3.0 (*)    AST 13 (*)    Alkaline Phosphatase 144 (*)    All other components within normal limits  CBC WITH DIFFERENTIAL/PLATELET - Abnormal; Notable for the following components:   WBC 23.5 (*)    Hemoglobin 11.5 (*)    HCT 35.6 (*)    Neutro Abs 19.6 (*)    Monocytes Absolute 2.0 (*)    All other components within normal limits  URINALYSIS, ROUTINE W REFLEX MICROSCOPIC - Abnormal; Notable for the following components:   APPearance CLOUDY (*)    Specific Gravity, Urine 1.001 (*)    Hgb urine dipstick LARGE (*)    Leukocytes, UA LARGE (*)    WBC, UA >50 (*)    Bacteria, UA RARE (*)    All other components within normal limits  CSF CELL COUNT WITH DIFFERENTIAL - Abnormal; Notable for the following components:   Color, CSF PINK (*)    Appearance, CSF HAZY (*)    RBC Count, CSF 3,620 (*)    WBC, CSF 40 (*)    Segmented Neutrophils-CSF 24 (*)    All other components within normal limits  CSF CELL COUNT WITH DIFFERENTIAL - Abnormal; Notable for the following components:   RBC Count, CSF 1 (*)      All other components within normal limits  PROTEIN, CSF - Abnormal; Notable for the following components:   Total  Protein, CSF 14 (*)    All other components within normal limits  CULTURE, BLOOD (ROUTINE X 2)  GROUP A STREP BY PCR  CSF CULTURE  CULTURE, BLOOD (ROUTINE X 2)  URINE CULTURE  LIPASE, BLOOD  PREGNANCY, URINE  MONONUCLEOSIS SCREEN  GLUCOSE, CSF  PATHOLOGIST SMEAR REVIEW   ____________________________________________  RADIOLOGY  Dg Chest 2 View  Result Date: 06/25/2017 CLINICAL DATA:  Sore throat, fever, fatigue, neck and head pain began 6 weeks ago. EXAM: CHEST - 2 VIEW COMPARISON:  Chest x-ray of October 16, 2012  FINDINGS: The lungs are adequately inflated and clear. The heart and pulmonary vascularity are normal. The mediastinum is normal in width. The trachea is midline. The bony thorax exhibits no acute abnormality. IMPRESSION: There is no active cardiopulmonary disease.  The Electronically Signed   By: David  Martinique M.D.   On: 06/25/2017 10:22    ____________________________________________   PROCEDURES  Procedure(s) performed:   .Lumbar Puncture Date/Time: 06/25/2017 3:25 PM Performed by: Margette Fast, MD Authorized by: Margette Fast, MD   Consent:    Consent obtained:  Written   Consent given by:  Patient   Risks discussed:  Bleeding, headache, nerve damage, infection, pain and repeat procedure   Alternatives discussed:  Delayed treatment Pre-procedure details:    Procedure purpose:  Diagnostic   Preparation: Patient was prepped and draped in usual sterile fashion   Anesthesia (see MAR for exact dosages):    Anesthesia method:  Local infiltration   Local anesthetic:  Lidocaine 1% w/o epi Procedure details:    Lumbar space:  L3-L4 interspace   Patient position:  Sitting   Needle gauge:  20   Needle type:  Spinal needle - Quincke tip   Needle length (in):  3.5   Ultrasound guidance: no     Number of attempts:  2   Fluid appearance:   Blood-tinged then clearing   Tubes of fluid:  4   Total volume (ml):  6 Post-procedure:    Puncture site:  Adhesive bandage applied and direct pressure applied   Patient tolerance of procedure:  Tolerated well, no immediate complications   ____________________________________________   INITIAL IMPRESSION / ASSESSMENT AND PLAN / ED COURSE  Pertinent labs & imaging results that were available during my care of the patient were reviewed by me and considered in my medical decision making (see chart for details).  Patient presents to the emergency department with viral type illness which has been persistent over the past 6 weeks.  Her exam and history is not at all consistent with bacterial meningitis.  My suspicion for this is a diagnosis is incredibly low.  She has no meningeal findings on exam.  Suspicion is higher for mono versus other viral illness.  Given persistent symptoms plan for chest x-ray, labs, IV fluids, headache medication, along with blood cultures.  Labs with elevated WBC count to 23. Other labs unremarkable. Negative monospot and strep. Patient HA improved after Toradol and Reglan. Discussed the risks/benefits of LP. Patient agreed to undergo procedure. Initial tube with RBCs and 40 WBCs. No organisms on gram stain. Tube 4 RBCs and WBCs have cleared. Cultures pending. Patient with some evidence of UTI on labs but few symptoms. She does not some mild discomfort in that area after recent sex but doubt this is causing 6 weeks of symptoms. Will cover with keflex now that pan-cultures are pending. Patient to f/u with PCP in the coming week. Discussed post LP care in detail.   At this time, I do not feel there is any life-threatening condition present. I have reviewed and discussed all results (EKG, imaging, lab, urine as appropriate), exam findings with patient. I have reviewed nursing notes and appropriate previous records.  I feel the patient is safe to be discharged home without  further emergent workup. Discussed usual and customary return precautions. Patient and family (if present) verbalize understanding and are comfortable with this plan.  Patient will follow-up with their primary care provider. If they do not have a primary care provider, information for follow-up  has been provided to them. All questions have been answered.  ____________________________________________  FINAL CLINICAL IMPRESSION(S) / ED DIAGNOSES  Final diagnoses:  Bad headache  Fever, unspecified fever cause  Neck pain  Acute cystitis with hematuria     MEDICATIONS GIVEN DURING THIS VISIT:  Medications  sodium chloride 0.9 % bolus 1,000 mL (0 mLs Intravenous Stopped 06/25/17 1131)  ketorolac (TORADOL) 30 MG/ML injection 30 mg (30 mg Intravenous Given 06/25/17 0916)  metoCLOPramide (REGLAN) tablet 10 mg (10 mg Oral Given 06/25/17 0916)  fentaNYL (SUBLIMAZE) injection 50 mcg (50 mcg Intravenous Given 06/25/17 1307)     NEW OUTPATIENT MEDICATIONS STARTED DURING THIS VISIT:  Discharge Medication List as of 06/25/2017  2:17 PM    START taking these medications   Details  cephALEXin (KEFLEX) 500 MG capsule Take 1 capsule (500 mg total) by mouth 3 (three) times daily for 7 days., Starting Tue 06/25/2017, Until Tue 07/02/2017, Print    ibuprofen (ADVIL,MOTRIN) 800 MG tablet Take 1 tablet (800 mg total) by mouth every 8 (eight) hours as needed., Starting Tue 06/25/2017, Print    ondansetron (ZOFRAN ODT) 4 MG disintegrating tablet Take 1 tablet (4 mg total) by mouth every 8 (eight) hours as needed for nausea or vomiting., Starting Tue 06/25/2017, Print        Note:  This document was prepared using Dragon voice recognition software and may include unintentional dictation errors.  Nanda Quinton, MD Emergency Medicine    Emaan Gary, [facility], MD 06/25/17 [phone]

## 2017-06-26 LAB — PATHOLOGIST SMEAR REVIEW

## 2017-06-27 LAB — URINE CULTURE
Culture: >=100000 — AB
Special Requests: NORMAL

## 2017-06-28 ENCOUNTER — Ambulatory Visit: Payer: Self-pay | Admitting: Neurology

## 2017-06-28 ENCOUNTER — Telehealth: Payer: Self-pay

## 2017-06-28 NOTE — Telephone Encounter (Signed)
Post ED Visit - Positive Culture Follow-up  Culture report reviewed by antimicrobial stewardship pharmacist:  []  Elenor Quinones, Pharm.D. []  Heide Guile, Pharm.D., BCPS AQ-ID []  Parks Neptune, Pharm.D., BCPS []  Alycia Rossetti, Pharm.D., BCPS []  [place], Pharm.D., BCPS, AAHIVP []  Legrand Como, Pharm.D., BCPS, AAHIVP []  Salome Arnt, PharmD, BCPS []  Wynell Balloon, PharmD []  Vincenza Hews, PharmD, BCPS Wynell Balloon Pharm D Positive urine culture Treated with Cephalexin, organism sensitive to the same and no further patient follow-up is required at this time.  Genia Del 06/28/2017, 9:29 AM

## 2017-06-29 LAB — CSF CULTURE: CULTURE: NO GROWTH

## 2017-06-29 LAB — CSF CULTURE W GRAM STAIN

## 2017-06-30 LAB — CULTURE, BLOOD (ROUTINE X 2)
Culture: NO GROWTH
Culture: NO GROWTH
SPECIAL REQUESTS: ADEQUATE
SPECIAL REQUESTS: NORMAL

## 2017-07-11 ENCOUNTER — Encounter: Payer: Self-pay | Admitting: Adult Health

## 2017-07-11 ENCOUNTER — Ambulatory Visit: Payer: 59 | Admitting: Adult Health

## 2017-07-11 VITALS — BP 120/82 | HR 67 | Ht 65.5 in | Wt 178.5 lb

## 2017-07-11 DIAGNOSIS — N766 Ulceration of vulva: Secondary | ICD-10-CM

## 2017-07-11 DIAGNOSIS — R52 Pain, unspecified: Secondary | ICD-10-CM

## 2017-07-11 DIAGNOSIS — N760 Acute vaginitis: Secondary | ICD-10-CM | POA: Diagnosis not present

## 2017-07-11 MED ORDER — PREDNISONE 10 MG PO TABS: ORAL_TABLET | ORAL | 0 refills | Status: DC

## 2017-07-11 MED ORDER — LIDOCAINE 5 % EX OINT: 1.0000 "application " | TOPICAL_OINTMENT | CUTANEOUS | 0 refills | Status: DC | PRN

## 2017-07-11 NOTE — Patient Instructions (Addendum)
Canker Sores Canker sores are small, painful sores that develop inside your mouth. They may also be called aphthous ulcers. You can get canker sores on the inside of your lips or cheeks, on your tongue, or anywhere inside your mouth. You can have just one canker sore or several of them. Canker sores cannot be passed from one person to another (noncontagious). These sores are different than the sores that you may get on the outside of your lips (cold sores or fever blisters). Canker sores usually start as painful red bumps. Then they turn into small white, yellow, or gray ulcers that have red borders. The ulcers may be quite painful. The pain may be worse when you eat or drink. What are the causes? The cause of this condition is not known. What increases the risk? This condition is more likely to develop in:  Women.  People in their teens or 90s.  Women who are having their menstrual period.  People who are under a lot of emotional stress.  People who do not get enough iron or B vitamins.  People who have poor oral hygiene.  People who have an injury inside the mouth. This can happen after having dental work or from chewing something hard.  What are the signs or symptoms? Along with the canker sore, symptoms may also include:  Fever.  Fatigue.  Swollen lymph nodes in your neck.  How is this diagnosed? This condition can be diagnosed based on your symptoms. Your health care provider will also examine your mouth. Your health care provider may also do tests if you get canker sores often or if they are very bad. Tests may include:  Blood tests to rule out other causes of canker sores.  Taking swabs from the sore to check for infection.  Taking a small piece of skin from the sore (biopsy) to test it for cancer.  How is this treated? Most canker sores clear up without treatment in about 10 days. Home care is usually the only treatment that you will need. Over-the-counter medicines  can relieve discomfort.If you have severe canker sores, your health care provider may prescribe:  Numbing ointment to relieve pain.  Vitamins.  Steroid medicines. These may be given as: ? Oral pills. ? Mouth rinses. ? Gels.  Antibiotic mouth rinse.  Follow these instructions at home:  Apply, take, or use medicines only as directed by your health care provider. These include vitamins.  If you were prescribed an antibiotic mouth rinse, finish all of it even if you start to feel better.  Until the sores are healed: ? Do not drink coffee or citrus juices. ? Do not eat spicy or salty foods.  Use a mild, over-the-counter mouth rinse as directed by your health care provider.  Practice good oral hygiene. ? Floss your teeth every day. ? Brush your teeth with a soft brush twice each day. Contact a health care provider if:  Your symptoms do not get better after two weeks.  You also have a fever or swollen glands.  You get canker sores often.  You have a canker sore that is getting larger.  You cannot eat or drink due to your canker sores. This information is not intended to replace advice given to you by your health care provider. Make sure you discuss any questions you have with your health care provider. Document Released: 06/02/2010 Document Revised: 07/14/2015 Document Reviewed: 01/06/2014 Elsevier Interactive Patient Education  2018 [place]. Oral Ulcers Oral ulcers are sores  inside the mouth or near the mouth. They may be called canker sores or cold sores, which are two types of oral ulcers. Many oral ulcers are harmless and go away on their own. In some cases, oral ulcers may require medical care to determine the cause and proper treatment. What are the causes? Common causes of this condition include:  Viral, bacterial, or fungal infection.  Emotional stress.  Foods or chemicals that irritate the mouth.  Injury or physical irritation of the  mouth.  Medicines.  Allergies.  Tobacco use.  Less common causes include:  Skin disease.  A type of herpes virus infection (herpes simplexor herpes zoster).  Oral cancer.  In some cases, the cause of this condition may not be known. What increases the risk? Oral ulcers are more likely to develop in:  People who wear dental braces, dentures, or retainers.  People who do not keep their mouth clean or brush their teeth regularly.  People who have sensitive skin.  People who have conditions that affect the entire body (systemic conditions), such as immune disorders.  What are the signs or symptoms? The main symptom of this condition is one or more oval-shaped or round ulcers that have red borders. Details about symptoms may vary depending on the cause.  Location of the ulcers. They may be inside the mouth, on the gums, or on the insides of the lips or cheeks. They may also be on the lips or on skin that is near the mouth, such as the cheeks and chin.  Pain. Ulcers can be painful and uncomfortable, or they can be painless.  Appearance of the ulcers. They may look like red blisters and be filled with fluid, or they may be white or yellow patches.  Frequency of outbreaks. Ulcers may go away permanently after one outbreak, or they may come back (recur) often or rarely.  How is this diagnosed? This condition is diagnosed with a physical exam. Your health care provider may ask you questions about your lifestyle and your medical history. You may have tests, including:  Blood tests.  Removal of a small number of cells from an ulcer to be examined under a microscope (biopsy).  How is this treated? This condition is treated by managing any pain and discomfort, and by treating the underlying cause of the ulcers, if necessary. Usually, oral ulcers resolve by themselves in 1-2 weeks. You may be told to keep your mouth clean and avoid things that cause or irritate your ulcers. Your  health care provider may prescribe medicines to reduce pain and discomfort or treat the underlying cause, if this applies. Follow these instructions at home: Lifestyle  Follow instructions from your health care provider about eating or drinking restrictions. ? Drink enough fluid to keep your urine clear or pale yellow. ? Avoid foods and drinks that irritate your ulcers.  Avoid tobacco products, including cigarettes, chewing tobacco, or e-cigarettes. If you need help quitting, ask your health care provider.  Avoid excessive alcohol use. Oral Hygiene  Avoid physical or chemical irritants that may have caused the ulcers or made them worse, such as mouthwashes that contain alcohol (ethanol). If you wear dental braces, dentures, or retainers, work with your health care provider to make sure these devices are fitted correctly.  Brush and floss your teeth at least once every day, and get regular dental cleanings and checkups.  Gargle with a salt-water mixture 3-4 times per day or as told by your health care provider. To make a  salt-water mixture, completely dissolve -1 tsp of salt in 1 cup of warm water. General instructions  Take over-the-counter and prescription medicines only as told by your health care provider.  If you have pain, wrap a cold compress in a towel and gently press it against your face to help reduce pain.  Keep all follow-up visits as told by your health care provider. This is important. Contact a health care provider if:  You have pain that gets worse or does not get better with medicine.  You have 4 or more ulcers at one time.  You have a fever.  You have new ulcers that look or feel different from other ulcers you have.  You have inflammation in one eye or both eyes.  You have ulcers that do not go away after 10 days.  You develop new symptoms in your mouth, such as: ? Bleeding or crusting around your lips or gums. ? Tooth pain. ? Difficulty  swallowing.  You develop symptoms on your skin or genitals, such as: ? A rash or blisters. ? Burning or itching sensations.  Your ulcers begin or get worse after you start a new medicine. Get help right away if:  You have difficulty breathing.  You have swelling in your face or neck.  You have excessive bleeding from your mouth.  You have severe pain. This information is not intended to replace advice given to you by your health care provider. Make sure you discuss any questions you have with your health care provider. Document Released: 03/15/2004 Document Revised: 07/11/2015 Document Reviewed: 06/23/2014 Elsevier Interactive Patient Education  [employer].

## 2017-07-11 NOTE — Progress Notes (Signed)
  Subjective:     Patient ID: Megan Houston, female   DOB: 01/21/1991, 27 y.o.   MRN: [id number]  HPI Megan Houston is a 27 year old white female, single worked in after seeing PCP this am and found to have ulcer in vagina.She was seen in ER 5/7 for fever and headache and had meningitis ruled out and was treated for UTI.  PCP is Delman Cheadle PA.  Review of Systems Pain in vagina Bleeds after sex +body aches  Reviewed past medical,surgical, social and family history. Reviewed medications and allergies.     Objective:   Physical Exam BP 120/82 (BP Location: Left Arm, Patient Position: Sitting, Cuff Size: Normal)   Pulse 67   Ht 5' 5.5" (1.664 m)   Wt 178 lb 8 oz (81 kg)   LMP 07/03/2017   BMI 29.25 kg/m  PHQ 2 score 0. Skin warm and dry.Pelvic: external genitalia is normal in appearance no lesions, vagina:red and irritated, has small ulcer at introitus,urethra has posterior ulcer,and appears slightly swollen and is tender to touch, HSV culture obtained, cervix:smooth, uterus: normal size, shape and contour, non tender, no masses felt, adnexa: no masses or tenderness noted. Bladder is non tender and no masses felt.Discussed with Dr Elonda Husky, who agrees with my plan.Showed pt pictures of Lipschutz ulcer in genital dermatology book.     Assessment:     1. Lipschutz ulcer       Plan:   [place]  HSV culture sent Meds ordered this encounter  Medications  . predniSONE (DELTASONE) 10 MG tablet    Sig: Take 4 daily for 10 days    Dispense:  40 tablet    Refill:  0    Order Specific Question:   Supervising Provider    Answer:   Elonda Husky, LUTHER H [[id number]]  . lidocaine (XYLOCAINE) 5 % ointment    Sig: Apply 1 application topically as needed.    Dispense:  35.44 g    Refill:  0    Order Specific Question:   Supervising Provider    Answer:   Florian Buff [[id number]]  F/U 5/29 with me

## 2017-07-14 LAB — HERPES SIMPLEX VIRUS CULTURE

## 2017-07-17 ENCOUNTER — Ambulatory Visit: Payer: 59 | Admitting: Adult Health

## 2017-07-17 ENCOUNTER — Encounter: Payer: Self-pay | Admitting: Adult Health

## 2017-07-17 VITALS — BP 118/54 | HR 90 | Ht 65.5 in | Wt 181.0 lb

## 2017-07-17 DIAGNOSIS — N766 Ulceration of vulva: Secondary | ICD-10-CM

## 2017-07-17 NOTE — Progress Notes (Signed)
  Subjective:     Patient ID: Megan Houston, female   DOB: Jul 19, 1990, 27 y.o.   MRN: [id number]  HPI Megan Houston is a 27 year old white female, back in follow up of having ulcers and pain in vagina.Was seen and treated 07/11/17.  Review of Systems No pain, started feeling better Saturday Reviewed past medical,surgical, social and family history. Reviewed medications and allergies.     Objective:   Physical Exam BP (!) 118/54 (BP Location: Left Arm, Patient Position: Sitting, Cuff Size: Normal)   Pulse 90   Ht 5' 5.5" (1.664 m)   Wt 181 lb (82.1 kg)   LMP 07/03/2017   BMI 29.66 kg/m  Skin warm and dry.Pelvic: external genitalia is normal in appearance no lesions, vagina:pink with good moisture and resolved ulcers,urethra has no lesions or masses noted, cervix:smooth.    Assessment:     Lipschutz ulcer resolved    Plan:     Finish prednisone Can resume sex when ready  F/U prn

## 2017-08-12 ENCOUNTER — Other Ambulatory Visit: Payer: Self-pay

## 2017-08-12 ENCOUNTER — Encounter: Payer: Self-pay | Admitting: Psychiatry

## 2017-08-12 ENCOUNTER — Ambulatory Visit: Payer: 59 | Admitting: Psychiatry

## 2017-08-12 VITALS — BP 129/84 | HR 73 | Temp 99.3°F | Wt 183.4 lb

## 2017-08-12 DIAGNOSIS — F3281 Premenstrual dysphoric disorder: Secondary | ICD-10-CM

## 2017-08-12 DIAGNOSIS — F3181 Bipolar II disorder: Secondary | ICD-10-CM | POA: Diagnosis not present

## 2017-08-12 MED ORDER — LAMOTRIGINE 100 MG PO TABS: 100.0000 mg | ORAL_TABLET | Freq: Every day | ORAL | 1 refills | Status: DC

## 2017-08-12 MED ORDER — FLUOXETINE HCL 10 MG PO CAPS: 10.0000 mg | ORAL_CAPSULE | Freq: Every day | ORAL | 1 refills | Status: DC

## 2017-08-12 MED ORDER — BUSPIRONE HCL 10 MG PO TABS: 20.0000 mg | ORAL_TABLET | Freq: Every day | ORAL | 1 refills | Status: DC

## 2017-08-12 NOTE — Progress Notes (Signed)
BH MD/PA/NP OP Progress Note  08/12/2017 12:07 PM Megan Houston  MRN:  [id number]  Subjective:  Patient is a 27 year-old  female with history of DMDD/ PMDD who presented for follow-up appointment.. Patient reported that she was very sick for the past 2 months several symptoms.  Patient reported that initially she was having flulike symptoms and was started on antibiotics and later she was diagnosed to have meningitis.  Patient was admitted to the hospital and she had LP.  Patient reported that her symptoms continued to get worse and later she was diagnosed with Lipschultz ulcers.  She was a started on 10-day course of steroids and continued with the antibiotics.  Patient reported that finally she got better.   She reported that she was unable to go to work during the this time and she was having headaches due to the LP.  Continue to take her lamotrigine and BuSpar but was not able to continue her Prozac.  However she recently started taking her Prozac and has noted that her symptoms have been improving.  She has been compliant with her medications.  She appeared calm and alert and have lost weight as well.  She reported that her medications are helpful.  She currently denied having any suicidal ideations or plans.  She stated that now she is feeling much improved her mood swings are under control.     She has been losing weight as well.  She is happy about her combination of medications at this time.    She currently denied having any suicidal or homicidal ideations or plans.  She denied using any drugs or alcohol.   Appeared cooperative during the interview.  She is receptive to medication changes.         Chief Complaint:  Chief Complaint    Follow-up; Medication Refill     Visit Diagnosis:     ICD-10-CM   1. PMDD (premenstrual dysphoric disorder) F32.81   2. Bipolar 2 disorder (HCC) F31.81 lamoTRIgine (LAMICTAL) 100 MG tablet    Past Medical History:  Past Medical History:   Diagnosis Date  . Anxiety   . Bipolar disorder ([place])   . Depression    History reviewed. No pertinent surgical history. Family History:  Family History  Problem Relation Age of Onset  . Hypertension Other   . Diabetes Other   . Hypertension Mother   . Bipolar disorder Mother   . Alcohol abuse Mother   . Drug abuse Mother   . Cancer Mother        cervical  . OCD Father   . Anxiety disorder Father   . Diabetes Father   . Hypertension Father   . Bipolar disorder Sister   . Drug abuse Sister   . Alcohol abuse Sister   . Liver disease Paternal Grandmother   . Diabetes Maternal Grandmother   . Bipolar disorder Maternal Grandmother   . Anxiety disorder Maternal Grandmother   . Schizophrenia Maternal Grandmother    Social History:  Social History   Socioeconomic History  . Marital status: Single    Spouse name: Not on file  . Number of children: Not on file  . Years of education: Not on file  . Highest education level: Not on file  Occupational History  . Not on file  Social Needs  . Financial resource strain: Not on file  . Food insecurity:    Worry: Not on file    Inability: Not on file  . Transportation needs:  Medical: Not on file    Non-medical: Not on file  Tobacco Use  . Smoking status: Former Smoker    Types: Cigarettes    Last attempt to quit: 05/27/2014    Years since quitting: 3.2  . Smokeless tobacco: Never Used  Substance and Sexual Activity  . Alcohol use: Not Currently    Alcohol/week: 0.0 oz    Comment: occasional  . Drug use: No  . Sexual activity: Yes    Birth control/protection: Pill  Lifestyle  . Physical activity:    Days per week: Not on file    Minutes per session: Not on file  . Stress: Not on file  Relationships  . Social connections:    Talks on phone: Not on file    Gets together: Not on file    Attends religious service: Not on file    Active member of club or organization: Not on file    Attends meetings of clubs or  organizations: Not on file    Relationship status: Not on file  Other Topics Concern  . Not on file  Social History Narrative  . Not on file   Additional History:  She currently works in Therapist, art.  Assessment:   Musculoskeletal: Strength & Muscle Tone: within normal limits Gait & Station: normal Patient leans: N/A  Psychiatric Specialty Exam: Medication Refill  Pertinent negatives include no nausea.  Anxiety  Symptoms include nervous/anxious behavior. Patient reports no nausea or suicidal ideas.      Review of Systems  Constitutional: Negative for weight loss.  HENT: Negative for ear discharge and hearing loss.   Eyes: Negative for pain.  Respiratory: Negative for stridor.   Cardiovascular: Negative for orthopnea.  Gastrointestinal: Negative for nausea.  Neurological: Negative for sensory change.  Psychiatric/Behavioral: Negative for substance abuse and suicidal ideas. The patient is nervous/anxious.     Blood pressure 129/84, pulse 73, temperature 99.3 F (37.4 C), temperature source Oral, weight 183 lb 6.4 oz (83.2 kg).Body mass index is 30.06 kg/m.  General Appearance: Casual  Eye Contact:  Fair  Speech:  Normal Rate  Volume:  Normal  Mood:  Anxious  Affect:  Congruent  Thought Process:  Coherent  Orientation:  Full (Time, Place, and Person)  Thought Content:  WDL  Suicidal Thoughts:  No  Homicidal Thoughts:  No  Memory:  Immediate;   Fair  Judgement:  Intact  Insight:  Fair  Psychomotor Activity:  Normal  Concentration:  Fair  Recall:  [employer] of Knowledge: Fair  Language: Fair  Akathisia:  No  Handed:  Right  AIMS (if indicated):  none  Assets:  Communication Skills Desire for Improvement Physical Health Social Support  ADL's:  Intact  Cognition: WNL  Sleep:  3-4     Current Medications: Current Outpatient Medications  Medication Sig Dispense Refill  . ALPRAZolam (XANAX) 0.25 MG tablet Take 1 tablet (0.25 mg total) by mouth at  bedtime as needed for anxiety. 10 tablet 1  . busPIRone (BUSPAR) 10 MG tablet Take 2 tablets (20 mg total) by mouth at bedtime. 60 tablet 1  . FLUoxetine (PROZAC) 10 MG capsule Take 1 capsule (10 mg total) by mouth daily. Takes 2 weeks before period 30 capsule 1  . lamoTRIgine (LAMICTAL) 100 MG tablet Take 1 tablet (100 mg total) by mouth daily. 30 tablet 1  . lidocaine (XYLOCAINE) 5 % ointment Apply 1 application topically as needed. 35.44 g 0  . TRI-SPRINTEC 0.18/0.215/0.25 MG-35 MCG tablet Take 1  tablet by mouth daily.   10   No current facility-administered medications for this visit.     Medical Decision Making:  Established Problem, Stable/Improving (1), Review of Psycho-Social Stressors (1) and Review of Last Therapy Session (1)  Treatment Plan Summary:Medication management    BuSpar 20mg  po qhs   Continue  lamotrigine 100 mg daily  Prozac 10 mg for 2 weeks for her PMDD symptoms. Xanax 0.25 mg half to 1 pill on a as needed basis and she agreed with the plan. .   Follow-up in 2 months or earlier depending on her symptoms   More than 50% of the time spent in psychoeducation, counseling and coordination of care.    This note was generated in part or whole with voice recognition software. Voice regonition is usually quite accurate but there are transcription errors that can and very often do occur. I apologize for any typographical errors that were not detected and corrected.   Rainey Pines, MD  08/12/2017, 12:07 PM

## 2017-10-14 ENCOUNTER — Encounter: Payer: Self-pay | Admitting: Psychiatry

## 2017-10-14 ENCOUNTER — Other Ambulatory Visit: Payer: Self-pay

## 2017-10-14 ENCOUNTER — Ambulatory Visit: Payer: 59 | Admitting: Psychiatry

## 2017-10-14 VITALS — BP 133/85 | HR 70 | Temp 98.8°F | Wt 181.8 lb

## 2017-10-14 DIAGNOSIS — F3281 Premenstrual dysphoric disorder: Secondary | ICD-10-CM | POA: Diagnosis not present

## 2017-10-14 DIAGNOSIS — F3181 Bipolar II disorder: Secondary | ICD-10-CM

## 2017-10-14 MED ORDER — FLUOXETINE HCL 10 MG PO CAPS: 10.0000 mg | ORAL_CAPSULE | Freq: Every day | ORAL | 1 refills | Status: DC

## 2017-10-14 MED ORDER — LAMOTRIGINE 100 MG PO TABS: 100.0000 mg | ORAL_TABLET | Freq: Every day | ORAL | 1 refills | Status: DC

## 2017-10-14 MED ORDER — BUSPIRONE HCL 10 MG PO TABS: 20.0000 mg | ORAL_TABLET | Freq: Every day | ORAL | 1 refills | Status: DC

## 2017-10-14 MED ORDER — ALPRAZOLAM 0.25 MG PO TABS: 0.2500 mg | ORAL_TABLET | Freq: Every evening | ORAL | 2 refills | Status: DC | PRN

## 2017-10-14 NOTE — Progress Notes (Signed)
BH MD/PA/NP OP Progress Note  10/14/2017 11:45 AM Megan Houston  MRN:  [id number]  Subjective:  Patient is a 27 year-old  female with history of PMDD who presented for follow-up appointment.. Patient reported that she has a started noticing improvement in her symptoms since she was started on the Prozac.  She stated that her mood symptoms are improving and she takes the medication 2 weeks before her menstrual  Cycle.  she reported that she usually takes it in the evening as she was having headaches in the morning.  She has been feeling more calm and her symptoms are improving.  She stated that she has been taking regularly.  Patient currently denied having any suicidal ideations or plans.  She stated that she is under training at her work for the past 2 weeks and she will get promotion as well.  She was discussing about the medications.  She stated that she is having good relationship with her boyfriend and has been living with him for the past 8 years.  She is planning to move forward with her relationship.  She appeared calm and alert during the interview and does not have any side effects to her medications.  She denied having any suicidal homicidal ideations or plans.  She denied having any perceptual disturbances.       She is happy about her combination of medications at this time.    Appeared cooperative during the interview.  She is receptive to medication changes.         Chief Complaint:  Chief Complaint    Follow-up; Medication Refill     Visit Diagnosis:     ICD-10-CM   1. PMDD (premenstrual dysphoric disorder) F32.81   2. Bipolar 2 disorder ([place]) F31.81     Past Medical History:  Past Medical History:  Diagnosis Date  . Anxiety   . Bipolar disorder ([place])   . Depression    History reviewed. No pertinent surgical history. Family History:  Family History  Problem Relation Age of Onset  . Hypertension Other   . Diabetes Other   . Hypertension Mother   . Bipolar  disorder Mother   . Alcohol abuse Mother   . Drug abuse Mother   . Cancer Mother        cervical  . OCD Father   . Anxiety disorder Father   . Diabetes Father   . Hypertension Father   . Bipolar disorder Sister   . Drug abuse Sister   . Alcohol abuse Sister   . Liver disease Paternal Grandmother   . Diabetes Maternal Grandmother   . Bipolar disorder Maternal Grandmother   . Anxiety disorder Maternal Grandmother   . Schizophrenia Maternal Grandmother    Social History:  Social History   Socioeconomic History  . Marital status: Single    Spouse name: Not on file  . Number of children: Not on file  . Years of education: Not on file  . Highest education level: Not on file  Occupational History  . Not on file  Social Needs  . Financial resource strain: Not on file  . Food insecurity:    Worry: Not on file    Inability: Not on file  . Transportation needs:    Medical: Not on file    Non-medical: Not on file  Tobacco Use  . Smoking status: Former Smoker    Types: Cigarettes    Last attempt to quit: 05/27/2014    Years since quitting: 3.3  .  Smokeless tobacco: Never Used  Substance and Sexual Activity  . Alcohol use: Not Currently    Alcohol/week: 0.0 standard drinks    Comment: occasional  . Drug use: No  . Sexual activity: Yes    Birth control/protection: Pill  Lifestyle  . Physical activity:    Days per week: Not on file    Minutes per session: Not on file  . Stress: Not on file  Relationships  . Social connections:    Talks on phone: Not on file    Gets together: Not on file    Attends religious service: Not on file    Active member of club or organization: Not on file    Attends meetings of clubs or organizations: Not on file    Relationship status: Not on file  Other Topics Concern  . Not on file  Social History Narrative  . Not on file   Additional History:  She currently works in Therapist, art.  Assessment:   Musculoskeletal: Strength &  Muscle Tone: within normal limits Gait & Station: normal Patient leans: N/A  Psychiatric Specialty Exam: Medication Refill  Pertinent negatives include no nausea.  Anxiety  Symptoms include nervous/anxious behavior. Patient reports no nausea or suicidal ideas.      Review of Systems  Constitutional: Negative for weight loss.  HENT: Negative for ear discharge and hearing loss.   Eyes: Negative for pain.  Respiratory: Negative for stridor.   Cardiovascular: Negative for orthopnea.  Gastrointestinal: Negative for nausea.  Neurological: Negative for sensory change.  Psychiatric/Behavioral: Negative for substance abuse and suicidal ideas. The patient is nervous/anxious.     Blood pressure 133/85, pulse 70, temperature 98.8 F (37.1 C), temperature source Oral, weight 181 lb 12.8 oz (82.5 kg), last menstrual period 09/24/2017.Body mass index is 29.79 kg/m.  General Appearance: Casual  Eye Contact:  Fair  Speech:  Normal Rate  Volume:  Normal  Mood:  Anxious  Affect:  Congruent  Thought Process:  Coherent  Orientation:  Full (Time, Place, and Person)  Thought Content:  WDL  Suicidal Thoughts:  No  Homicidal Thoughts:  No  Memory:  Immediate;   Fair  Judgement:  Intact  Insight:  Fair  Psychomotor Activity:  Normal  Concentration:  Fair  Recall:  [employer] of Knowledge: Fair  Language: Fair  Akathisia:  No  Handed:  Right  AIMS (if indicated):  none  Assets:  Communication Skills Desire for Improvement Physical Health Social Support  ADL's:  Intact  Cognition: WNL  Sleep:  3-4     Current Medications: Current Outpatient Medications  Medication Sig Dispense Refill  . ALPRAZolam (XANAX) 0.25 MG tablet Take 1 tablet (0.25 mg total) by mouth at bedtime as needed for anxiety. 10 tablet 1  . busPIRone (BUSPAR) 10 MG tablet Take 2 tablets (20 mg total) by mouth at bedtime. 60 tablet 1  . FLUoxetine (PROZAC) 10 MG capsule Take 1 capsule (10 mg total) by mouth daily.  Takes 2 weeks before period 30 capsule 1  . lamoTRIgine (LAMICTAL) 100 MG tablet Take 1 tablet (100 mg total) by mouth daily. 30 tablet 1  . lidocaine (XYLOCAINE) 5 % ointment Apply 1 application topically as needed. 35.44 g 0  . TRI-SPRINTEC 0.18/0.215/0.25 MG-35 MCG tablet Take 1 tablet by mouth daily.   10   No current facility-administered medications for this visit.     Medical Decision Making:  Established Problem, Stable/Improving (1), Review of Psycho-Social Stressors (1) and Review of Last  Therapy Session (1)  Treatment Plan Summary:Medication management    BuSpar 20mg  po qhs   Continue  lamotrigine 100 mg daily  Prozac 10 mg for 2 weeks for her PMDD symptoms. Xanax 0.25 mg half to 1 pill on a as needed basis and she agreed with the plan. .   Follow-up in 2 months or earlier depending on her symptoms   More than 50% of the time spent in psychoeducation, counseling and coordination of care.    This note was generated in part or whole with voice recognition software. Voice regonition is usually quite accurate but there are transcription errors that can and very often do occur. I apologize for any typographical errors that were not detected and corrected.   Rainey Pines, MD  10/14/2017, 11:45 AM

## 2017-11-21 DIAGNOSIS — R0602 Shortness of breath: Secondary | ICD-10-CM | POA: Diagnosis not present

## 2017-11-22 ENCOUNTER — Other Ambulatory Visit: Payer: Self-pay

## 2017-11-22 ENCOUNTER — Emergency Department (HOSPITAL_COMMUNITY): Payer: 59

## 2017-11-22 ENCOUNTER — Encounter (HOSPITAL_COMMUNITY): Payer: Self-pay | Admitting: Emergency Medicine

## 2017-11-22 ENCOUNTER — Emergency Department (HOSPITAL_COMMUNITY)
Admission: EM | Admit: 2017-11-22 | Discharge: 2017-11-22 | Disposition: A | Discharge status: Home / Self Care | Payer: 59 | Attending: Emergency Medicine | Admitting: Emergency Medicine

## 2017-11-22 DIAGNOSIS — Z87891 Personal history of nicotine dependence: Secondary | ICD-10-CM | POA: Insufficient documentation

## 2017-11-22 DIAGNOSIS — R0602 Shortness of breath: Secondary | ICD-10-CM | POA: Insufficient documentation

## 2017-11-22 DIAGNOSIS — Z79899 Other long term (current) drug therapy: Secondary | ICD-10-CM | POA: Diagnosis not present

## 2017-11-22 MED ORDER — ALBUTEROL SULFATE (2.5 MG/3ML) 0.083% IN NEBU: 5.0000 mg | INHALATION_SOLUTION | Freq: Once | RESPIRATORY_TRACT | Status: DC

## 2017-11-22 NOTE — ED Notes (Signed)
Patient refused breathing treatment and advised that she did not need a treatment when RN advised patient that respiratory would be down to give a breathing treatment.

## 2017-11-22 NOTE — ED Notes (Signed)
Patient left with out discharge instructions and re-peat vitals.

## 2017-11-22 NOTE — ED Triage Notes (Signed)
Pt c/o of sob x 1 week.  Pt states "I cant fill my lungs up or take a deep breath"

## 2017-11-23 NOTE — ED Provider Notes (Signed)
[facility] EMERGENCY DEPARTMENT Provider Note   CSN: [id number] Arrival date & time: 11/22/17  1802     History   Chief Complaint Chief Complaint  Patient presents with  . Shortness of Breath    HPI Megan Houston is a 27 y.o. female.   Shortness of Breath  This is a new problem. The problem occurs continuously.The current episode started yesterday. The problem has not changed since onset.Pertinent negatives include no fever. She has tried nothing for the symptoms. The treatment provided no relief. She has had no prior hospitalizations. She has had no prior ED visits.    Past Medical History:  Diagnosis Date  . Anxiety   . Bipolar disorder ([place])   . Depression     Patient Active Problem List   Diagnosis Date Noted  . Bipolar 2 disorder ([place]) 07/23/2014  . Anxiety, generalized 07/23/2014  . CARDIAC MURMUR 01/04/2010  . ELEVATED BP READING WITHOUT DX HYPERTENSION 01/04/2010    History reviewed. No pertinent surgical history.   OB History    Gravida  0   Para  0   Term  0   Preterm  0   AB  0   Living  0     SAB  0   TAB  0   Ectopic  0   Multiple  0   Live Births  0            Home Medications    Prior to Admission medications   Medication Sig Start Date End Date Taking? Authorizing Provider  ALPRAZolam (XANAX) 0.25 MG tablet Take 1 tablet (0.25 mg total) by mouth at bedtime as needed for anxiety. 10/14/17  Yes Rainey Pines, MD  busPIRone (BUSPAR) 10 MG tablet Take 2 tablets (20 mg total) by mouth at bedtime. 10/14/17  Yes Rainey Pines, MD  FLUoxetine (PROZAC) 10 MG capsule Take 1 capsule (10 mg total) by mouth daily. Takes 2 weeks before period Patient taking differently: Take 10 mg by mouth See admin instructions. Takes once daily 2 weeks before period 10/14/17  Yes Rainey Pines, MD  lamoTRIgine (LAMICTAL) 100 MG tablet Take 1 tablet (100 mg total) by mouth daily. 10/14/17  Yes Rainey Pines, MD  TRI-SPRINTEC 0.18/0.215/0.25 MG-35 MCG  tablet Take 1 tablet by mouth daily.  07/06/14  Yes [provider]    Family History Family History  Problem Relation Age of Onset  . Hypertension Other   . Diabetes Other   . Hypertension Mother   . Bipolar disorder Mother   . Alcohol abuse Mother   . Drug abuse Mother   . Cancer Mother        cervical  . OCD Father   . Anxiety disorder Father   . Diabetes Father   . Hypertension Father   . Bipolar disorder Sister   . Drug abuse Sister   . Alcohol abuse Sister   . Liver disease Paternal Grandmother   . Diabetes Maternal Grandmother   . Bipolar disorder Maternal Grandmother   . Anxiety disorder Maternal Grandmother   . Schizophrenia Maternal Grandmother     Social History Social History   Tobacco Use  . Smoking status: Former Smoker    Types: Cigarettes    Last attempt to quit: 05/27/2014    Years since quitting: 3.4  . Smokeless tobacco: Never Used  Substance Use Topics  . Alcohol use: Not Currently    Alcohol/week: 0.0 standard drinks    Comment: occasional  . Drug use:  No     Allergies   Codeine and Hydrocodone-acetaminophen   Review of Systems Review of Systems  Constitutional: Negative for fever.  Respiratory: Positive for shortness of breath.   All other systems reviewed and are negative.    Physical Exam Updated Vital Signs BP 131/78   Pulse 77   Temp 98.6 F (37 C) (Oral)   Resp 19   Ht 5\' 5"  (1.651 m)   Wt 83 kg   LMP 11/18/2017   SpO2 100%   BMI 30.45 kg/m   Physical Exam  Constitutional: She is oriented to person, place, and time. She appears well-developed and well-nourished.  HENT:  Head: Normocephalic and atraumatic.  Eyes: Conjunctivae and EOM are normal.  Neck: Normal range of motion.  Cardiovascular: Normal rate and regular rhythm.  Pulmonary/Chest: No stridor. No respiratory distress.  Abdominal: Soft. She exhibits no distension.  Musculoskeletal: Normal range of motion. She exhibits no edema or deformity.    Neurological: She is alert and oriented to person, place, and time. No cranial nerve deficit. Coordination normal.  Skin: Skin is warm and dry.  Nursing note and vitals reviewed.    ED Treatments / Results  Labs (all labs ordered are listed, but only abnormal results are displayed) Labs Reviewed - No data to display  EKG EKG Interpretation  Date/Time:  Friday November 22 2017 18:19:51 EDT Ventricular Rate:  80 PR Interval:    QRS Duration: 79 QT Interval:  366 QTC Calculation: 423 R Axis:   62 Text Interpretation:  Sinus rhythm Borderline Q waves in inferior leads No significant change since last tracing Confirmed by Merrily Pew [phone]) on 11/22/2017 6:32:20 PM   Radiology Dg Chest 2 View  Result Date: 11/22/2017 CLINICAL DATA:  Shortness of breath EXAM: CHEST - 2 VIEW COMPARISON:  06/25/2017 FINDINGS: The heart size and mediastinal contours are within normal limits. Both lungs are clear. The visualized skeletal structures are unremarkable. IMPRESSION: No active cardiopulmonary disease. Electronically Signed   By: Donavan Foil M.D.   On: 11/22/2017 19:09    Procedures Procedures (including critical care time)  Medications Ordered in ED Medications - No data to display   Initial Impression / Assessment and Plan / ED Course  I have reviewed the triage vital signs and the nursing notes.  Pertinent labs & imaging results that were available during my care of the patient were reviewed by me and considered in my medical decision making (see chart for details).     Unclear etiology. Possibly anxiety. Low suspicion for acs/pe or other acute causes. Stable for dc.   Final Clinical Impressions(s) / ED Diagnoses   Final diagnoses:  SOB (shortness of breath)    ED Discharge Orders    None       Farhad Burleson, Corene Cornea, MD 11/23/17 2354

## 2018-01-06 ENCOUNTER — Ambulatory Visit: Payer: 59 | Admitting: Psychiatry

## 2018-01-06 ENCOUNTER — Encounter: Payer: Self-pay | Admitting: Psychiatry

## 2018-01-06 DIAGNOSIS — F3181 Bipolar II disorder: Secondary | ICD-10-CM | POA: Diagnosis not present

## 2018-01-06 DIAGNOSIS — F3281 Premenstrual dysphoric disorder: Secondary | ICD-10-CM | POA: Diagnosis not present

## 2018-01-06 MED ORDER — FLUOXETINE HCL 10 MG PO CAPS: 10.0000 mg | ORAL_CAPSULE | Freq: Every day | ORAL | 1 refills | Status: DC

## 2018-01-06 MED ORDER — PROPRANOLOL HCL 10 MG PO TABS: 10.0000 mg | ORAL_TABLET | Freq: Two times a day (BID) | ORAL | 2 refills | Status: DC

## 2018-01-06 MED ORDER — LAMOTRIGINE 100 MG PO TABS: 100.0000 mg | ORAL_TABLET | Freq: Every day | ORAL | 1 refills | Status: DC

## 2018-01-06 MED ORDER — ALPRAZOLAM 0.25 MG PO TABS: 0.2500 mg | ORAL_TABLET | Freq: Every evening | ORAL | 2 refills | Status: DC | PRN

## 2018-01-06 MED ORDER — BUSPIRONE HCL 10 MG PO TABS: 20.0000 mg | ORAL_TABLET | Freq: Every day | ORAL | 1 refills | Status: DC

## 2018-01-06 NOTE — Progress Notes (Signed)
BH MD/PA/NP OP Progress Note  01/06/2018 11:28 AM Megan Houston  MRN:  [id number]  Subjective:  Patient is a 27 year-old  female with history of PMDD who presented for follow-up appointment.. Patient reported that she has a started noticing improvement in her symptoms since she was started on the Prozac.  She however remains anxious about her Jacquelynn Cree who has been sick and is going downhill.  She reported that she was having panic attacks since September as she is concerned about her Jacquelynn Cree and she feels that she can die anytime soon.  Patient reported that she went to her primary care physician and they evaluated her but they also told her that she has increase in her anxiety symptoms.  They did not provide her with any medications.  Patient reported that she has occasional panic attacks related to her condition.  We discussed about her medications.  She is agreeable to a trial of propranolol at this time.   She reported that she takes Xanax on a as needed basis which helps her briefly but then she starts thinking about her Jacquelynn Cree which makes her feel worse.  She stated that Prozac is helping her.  She denied having any suicidal ideations or plans.  She has anticipatory anxiety most of the time.    She is happy about her combination of medications at this time.    Appeared cooperative during the interview.  She is receptive to medication changes.         Chief Complaint:   Visit Diagnosis:     ICD-10-CM   1. PMDD (premenstrual dysphoric disorder) F32.81   2. Bipolar 2 disorder ([place]) F31.81     Past Medical History:  Past Medical History:  Diagnosis Date  . Anxiety   . Bipolar disorder ([place])   . Depression    No past surgical history on file. Family History:  Family History  Problem Relation Age of Onset  . Hypertension Other   . Diabetes Other   . Hypertension Mother   . Bipolar disorder Mother   . Alcohol abuse Mother   . Drug abuse Mother   . Cancer Mother        cervical   . OCD Father   . Anxiety disorder Father   . Diabetes Father   . Hypertension Father   . Bipolar disorder Sister   . Drug abuse Sister   . Alcohol abuse Sister   . Liver disease Paternal Grandmother   . Diabetes Maternal Grandmother   . Bipolar disorder Maternal Grandmother   . Anxiety disorder Maternal Grandmother   . Schizophrenia Maternal Grandmother    Social History:  Social History   Socioeconomic History  . Marital status: Single    Spouse name: Not on file  . Number of children: Not on file  . Years of education: Not on file  . Highest education level: Not on file  Occupational History  . Not on file  Social Needs  . Financial resource strain: Not on file  . Food insecurity:    Worry: Not on file    Inability: Not on file  . Transportation needs:    Medical: Not on file    Non-medical: Not on file  Tobacco Use  . Smoking status: Former Smoker    Types: Cigarettes    Last attempt to quit: 05/27/2014    Years since quitting: 3.6  . Smokeless tobacco: Never Used  Substance and Sexual Activity  . Alcohol use: Not Currently  Alcohol/week: 0.0 standard drinks    Comment: occasional  . Drug use: No  . Sexual activity: Yes    Birth control/protection: Pill  Lifestyle  . Physical activity:    Days per week: Not on file    Minutes per session: Not on file  . Stress: Not on file  Relationships  . Social connections:    Talks on phone: Not on file    Gets together: Not on file    Attends religious service: Not on file    Active member of club or organization: Not on file    Attends meetings of clubs or organizations: Not on file    Relationship status: Not on file  Other Topics Concern  . Not on file  Social History Narrative  . Not on file   Additional History:  She currently works in Therapist, art.  Assessment:   Musculoskeletal: Strength & Muscle Tone: within normal limits Gait & Station: normal Patient leans: N/A  Psychiatric Specialty  Exam: Medication Refill  Pertinent negatives include no nausea.  Anxiety  Symptoms include nervous/anxious behavior. Patient reports no nausea or suicidal ideas.      Review of Systems  Constitutional: Negative for weight loss.  HENT: Negative for ear discharge and hearing loss.   Eyes: Negative for pain.  Respiratory: Negative for stridor.   Cardiovascular: Negative for orthopnea.  Gastrointestinal: Negative for nausea.  Neurological: Negative for sensory change.  Psychiatric/Behavioral: Negative for substance abuse and suicidal ideas. The patient is nervous/anxious.     There were no vitals taken for this visit.There is no height or weight on file to calculate BMI.  General Appearance: Casual  Eye Contact:  Fair  Speech:  Normal Rate  Volume:  Normal  Mood:  Anxious  Affect:  Congruent  Thought Process:  Coherent  Orientation:  Full (Time, Place, and Person)  Thought Content:  WDL  Suicidal Thoughts:  No  Homicidal Thoughts:  No  Memory:  Immediate;   Fair  Judgement:  Intact  Insight:  Fair  Psychomotor Activity:  Normal  Concentration:  Fair  Recall:  [employer] of Knowledge: Fair  Language: Fair  Akathisia:  No  Handed:  Right  AIMS (if indicated):  none  Assets:  Communication Skills Desire for Improvement Physical Health Social Support  ADL's:  Intact  Cognition: WNL  Sleep:  3-4     Current Medications: Current Outpatient Medications  Medication Sig Dispense Refill  . ALPRAZolam (XANAX) 0.25 MG tablet Take 1 tablet (0.25 mg total) by mouth at bedtime as needed for anxiety. 10 tablet 2  . busPIRone (BUSPAR) 10 MG tablet Take 2 tablets (20 mg total) by mouth at bedtime. 120 tablet 1  . FLUoxetine (PROZAC) 10 MG capsule Take 1 capsule (10 mg total) by mouth daily. Takes 2 weeks before period (Patient taking differently: Take 10 mg by mouth See admin instructions. Takes once daily 2 weeks before period) 90 capsule 1  . lamoTRIgine (LAMICTAL) 100 MG tablet  Take 1 tablet (100 mg total) by mouth daily. 90 tablet 1  . TRI-SPRINTEC 0.18/0.215/0.25 MG-35 MCG tablet Take 1 tablet by mouth daily.   10   No current facility-administered medications for this visit.     Medical Decision Making:  Established Problem, Stable/Improving (1), Review of Psycho-Social Stressors (1) and Review of Last Therapy Session (1)  Treatment Plan Summary:Medication management    BuSpar 20mg  po qhs   Continue  lamotrigine 100 mg daily  Prozac 10 mg  for 2 weeks for her PMDD symptoms. Start her on propranolol 10 mg p.o. twice daily as needed and discussed with her about the side effects in detail and she agreed with the plan.    Xanax 0.25 mg half to 1 pill on a as needed basis and she agreed with the plan.  Gets only 10 pills/month. .   Follow-up in 2 months or earlier depending on her symptoms   More than 50% of the time spent in psychoeducation, counseling and coordination of care.    This note was generated in part or whole with voice recognition software. Voice regonition is usually quite accurate but there are transcription errors that can and very often do occur. I apologize for any typographical errors that were not detected and corrected.   Rainey Pines, MD  01/06/2018, 11:28 AM

## 2018-03-03 ENCOUNTER — Other Ambulatory Visit: Payer: Self-pay

## 2018-03-03 ENCOUNTER — Encounter: Payer: Self-pay | Admitting: Psychiatry

## 2018-03-03 ENCOUNTER — Ambulatory Visit: Payer: 59 | Admitting: Psychiatry

## 2018-03-03 ENCOUNTER — Telehealth: Payer: Self-pay | Admitting: Child and Adolescent Psychiatry

## 2018-03-03 VITALS — BP 138/84 | HR 60 | Temp 99.1°F | Wt 190.4 lb

## 2018-03-03 DIAGNOSIS — F3281 Premenstrual dysphoric disorder: Secondary | ICD-10-CM

## 2018-03-03 DIAGNOSIS — F3181 Bipolar II disorder: Secondary | ICD-10-CM

## 2018-03-03 MED ORDER — BUSPIRONE HCL 10 MG PO TABS: 20.0000 mg | ORAL_TABLET | Freq: Two times a day (BID) | ORAL | 2 refills | Status: DC

## 2018-03-03 MED ORDER — ALPRAZOLAM 0.25 MG PO TABS: 0.2500 mg | ORAL_TABLET | Freq: Every evening | ORAL | 2 refills | Status: DC | PRN

## 2018-03-03 NOTE — Telephone Encounter (Signed)
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 Gretel Acre today. Due to system glitch Dr. Gretel Acre unable to send rx of Xanax to pt's pharmacy electronically and requested this writer to send the rx. Rx sent per Dr. Anola Gurney request.

## 2018-03-03 NOTE — Progress Notes (Signed)
BH MD/PA/NP OP Progress Note  03/03/2018 11:36 AM Megan Houston  MRN:  [id number]  Subjective:  Patient is a 28 year-old  female with history of PMDD who presented for follow-up appointment.. Patient reported that she was just engaged on Tuesday and is planning to get married in September.  She reported that she is excited and anxious about the same.  Her stepmother is going to help her.  She reported that she tried the propanolol for her social anxiety and it made her worse.  She was having extreme abdominal pain diarrhea and was having nightmares.  She reported that she was yelling in her nightmares and her fianc got scared.  She reported that she stopped the propanolol after couple of doses.  She reported that she does not want to try the propanolol although it helped with her chest pain and palpitations.  She stated that she continues to feel anxious and wants to take some other medication.  We discussed about her medications in detail.  She is agreeable to go higher on the dose of the BuSpar at this time.  She reported that she is anxious about her wedding planning and social anxiety.  She denied having any suicidal homicidal ideations or plans.    Patient is compliant with her medications.  She denied having any adverse effects at this time.  She is sleeping well at night.     She reported that she takes Xanax on a as needed basis which helps her briefly. She has anticipatory anxiety most of the time.    Appeared cooperative during the interview.  She is receptive to medication changes.         Chief Complaint:  Chief Complaint    Follow-up; Medication Refill     Visit Diagnosis:     ICD-10-CM   1. PMDD (premenstrual dysphoric disorder) F32.81   2. Bipolar 2 disorder ([place]) F31.81     Past Medical History:  Past Medical History:  Diagnosis Date  . Anxiety   . Bipolar disorder ([place])   . Depression    History reviewed. No pertinent surgical history. Family History:   Family History  Problem Relation Age of Onset  . Hypertension Other   . Diabetes Other   . Hypertension Mother   . Bipolar disorder Mother   . Alcohol abuse Mother   . Drug abuse Mother   . Cancer Mother        cervical  . OCD Father   . Anxiety disorder Father   . Diabetes Father   . Hypertension Father   . Bipolar disorder Sister   . Drug abuse Sister   . Alcohol abuse Sister   . Liver disease Paternal Grandmother   . Diabetes Maternal Grandmother   . Bipolar disorder Maternal Grandmother   . Anxiety disorder Maternal Grandmother   . Schizophrenia Maternal Grandmother    Social History:  Social History   Socioeconomic History  . Marital status: Single    Spouse name: Not on file  . Number of children: Not on file  . Years of education: Not on file  . Highest education level: Not on file  Occupational History  . Not on file  Social Needs  . Financial resource strain: Not on file  . Food insecurity:    Worry: Not on file    Inability: Not on file  . Transportation needs:    Medical: Not on file    Non-medical: Not on file  Tobacco Use  .  Smoking status: Former Smoker    Types: Cigarettes    Last attempt to quit: 05/27/2014    Years since quitting: 3.7  . Smokeless tobacco: Never Used  Substance and Sexual Activity  . Alcohol use: Not Currently    Alcohol/week: 0.0 standard drinks    Comment: occasional  . Drug use: No  . Sexual activity: Yes    Birth control/protection: Pill  Lifestyle  . Physical activity:    Days per week: Not on file    Minutes per session: Not on file  . Stress: Not on file  Relationships  . Social connections:    Talks on phone: Not on file    Gets together: Not on file    Attends religious service: Not on file    Active member of club or organization: Not on file    Attends meetings of clubs or organizations: Not on file    Relationship status: Not on file  Other Topics Concern  . Not on file  Social History Narrative  .  Not on file   Additional History:  She currently works in Therapist, art.  Assessment:   Musculoskeletal: Strength & Muscle Tone: within normal limits Gait & Station: normal Patient leans: N/A  Psychiatric Specialty Exam: Medication Refill  Pertinent negatives include no nausea.  Anxiety  Symptoms include nervous/anxious behavior. Patient reports no nausea or suicidal ideas.      Review of Systems  Constitutional: Negative for weight loss.  HENT: Negative for ear discharge and hearing loss.   Eyes: Negative for pain.  Respiratory: Negative for stridor.   Cardiovascular: Negative for orthopnea.  Gastrointestinal: Negative for nausea.  Neurological: Negative for sensory change.  Psychiatric/Behavioral: Negative for substance abuse and suicidal ideas. The patient is nervous/anxious.     Blood pressure 138/84, pulse 60, temperature 99.1 F (37.3 C), temperature source Oral, weight 190 lb 6.4 oz (86.4 kg).Body mass index is 31.68 kg/m.  General Appearance: Casual  Eye Contact:  Fair  Speech:  Normal Rate  Volume:  Normal  Mood:  Anxious  Affect:  Congruent  Thought Process:  Coherent  Orientation:  Full (Time, Place, and Person)  Thought Content:  WDL  Suicidal Thoughts:  No  Homicidal Thoughts:  No  Memory:  Immediate;   Fair  Judgement:  Intact  Insight:  Fair  Psychomotor Activity:  Normal  Concentration:  Fair  Recall:  [employer] of Knowledge: Fair  Language: Fair  Akathisia:  No  Handed:  Right  AIMS (if indicated):  none  Assets:  Communication Skills Desire for Improvement Physical Health Social Support  ADL's:  Intact  Cognition: WNL  Sleep:  3-4     Current Medications: Current Outpatient Medications  Medication Sig Dispense Refill  . ALPRAZolam (XANAX) 0.25 MG tablet Take 1 tablet (0.25 mg total) by mouth at bedtime as needed for anxiety. 10 tablet 2  . busPIRone (BUSPAR) 10 MG tablet Take 2 tablets (20 mg total) by mouth at bedtime. 120  tablet 1  . FLUoxetine (PROZAC) 10 MG capsule Take 1 capsule (10 mg total) by mouth daily. Takes 2 weeks before period 90 capsule 1  . lamoTRIgine (LAMICTAL) 100 MG tablet Take 1 tablet (100 mg total) by mouth daily. 90 tablet 1  . propranolol (INDERAL) 10 MG tablet Take 1 tablet (10 mg total) by mouth 2 (two) times daily. 60 tablet 2  . TRI-SPRINTEC 0.18/0.215/0.25 MG-35 MCG tablet Take 1 tablet by mouth daily.   10  No current facility-administered medications for this visit.     Medical Decision Making:  Established Problem, Stable/Improving (1), Review of Psycho-Social Stressors (1) and Review of Last Therapy Session (1)  Treatment Plan Summary:Medication management    BuSpar 20mg  po BID for anxiety symptoms. Continue  lamotrigine 100 mg daily  Prozac 10 mg for 2 weeks for her PMDD symptoms. Discontinue propanolol   Continue Xanax 0.25 mg half to 1 pill on a as needed basis and she agreed with the plan.  Gets only 10 pills/month. .   Follow-up in 2 months or earlier depending on her symptoms   More than 50% of the time spent in psychoeducation, counseling and coordination of care.    This note was generated in part or whole with voice recognition software. Voice regonition is usually quite accurate but there are transcription errors that can and very often do occur. I apologize for any typographical errors that were not detected and corrected.   Rainey Pines, MD  03/03/2018, 11:36 AM

## 2018-04-22 DIAGNOSIS — Z719 Counseling, unspecified: Secondary | ICD-10-CM | POA: Diagnosis not present

## 2018-06-02 ENCOUNTER — Encounter: Payer: Self-pay | Admitting: Psychiatry

## 2018-06-02 ENCOUNTER — Ambulatory Visit (INDEPENDENT_AMBULATORY_CARE_PROVIDER_SITE_OTHER): Payer: 59 | Admitting: Psychiatry

## 2018-06-02 ENCOUNTER — Other Ambulatory Visit: Payer: Self-pay

## 2018-06-02 DIAGNOSIS — F3281 Premenstrual dysphoric disorder: Secondary | ICD-10-CM | POA: Diagnosis not present

## 2018-06-02 DIAGNOSIS — F3181 Bipolar II disorder: Secondary | ICD-10-CM | POA: Diagnosis not present

## 2018-06-02 MED ORDER — LAMOTRIGINE 100 MG PO TABS: 100.0000 mg | ORAL_TABLET | Freq: Every day | ORAL | 1 refills | Status: DC

## 2018-06-02 MED ORDER — BUSPIRONE HCL 10 MG PO TABS: 10.0000 mg | ORAL_TABLET | Freq: Every day | ORAL | 2 refills | Status: DC

## 2018-06-02 MED ORDER — ALPRAZOLAM 0.5 MG PO TABS: 0.5000 mg | ORAL_TABLET | Freq: Every evening | ORAL | 2 refills | Status: DC | PRN

## 2018-06-02 MED ORDER — FLUOXETINE HCL 10 MG PO CAPS: 10.0000 mg | ORAL_CAPSULE | Freq: Every day | ORAL | 1 refills | Status: DC

## 2018-06-02 NOTE — Progress Notes (Signed)
Patient ID: Megan Houston, female   DOB: May 03, 1990, 28 y.o.   MRN: [id number]   Patient is a 28 year old female with history of PMDD and bipolar disorder who was followed for her medications. She reported that she has been feeling anxious and has been depressed as her grandmother recently passed away two weeks ago. Patient reported that they were allowed to have a small funeral with only 10 family members present. Patient reported that she is processing her death with the presence of her family and has support of them. Patient reported that some days are better than others. She currently denied having any side effects of her medications. She reported that she was unable to tolerate the higher dose of the BuSpar. She continued to have anxiety symptoms. We discussed about her medications. She stated that she has been taking propranolol and Xanax on a PRN basis as she has G.I. side effects from propranolol. She wants to go higher on the dose of Xanax as it helps with her anxiety and she also takes it as needed. She has been working 14 hours per week. She has been maintaining social distancing at this time. She is able to contract for safety. No other issues noted at this time.  Plan; Change BuSpar 10 mg PO at bedtime. Increase Xanax 0.5 mg QHS PRN and called the prescription to the pharmacy. Continue with her medications. She will follow with three months

## 2018-06-02 NOTE — Progress Notes (Cosign Needed)
TC on 06-02-18 @ 10:48 pt medical and surgical hx was reviewed with no updates. Pt medication and pharmacy was reviewed and no updates. Pt allergies was reviewed without any updates. Pt vitals were not taken due to patient having a phone consult

## 2018-09-01 ENCOUNTER — Encounter: Payer: Self-pay | Admitting: Psychiatry

## 2018-09-01 ENCOUNTER — Other Ambulatory Visit: Payer: Self-pay

## 2018-09-01 ENCOUNTER — Ambulatory Visit (INDEPENDENT_AMBULATORY_CARE_PROVIDER_SITE_OTHER): Payer: 59 | Admitting: Psychiatry

## 2018-09-01 DIAGNOSIS — F3181 Bipolar II disorder: Secondary | ICD-10-CM

## 2018-09-01 MED ORDER — FLUOXETINE HCL 10 MG PO CAPS: 10.0000 mg | ORAL_CAPSULE | Freq: Every day | ORAL | 1 refills | Status: DC

## 2018-09-01 MED ORDER — BUSPIRONE HCL 10 MG PO TABS: 10.0000 mg | ORAL_TABLET | Freq: Every day | ORAL | 2 refills | Status: DC

## 2018-09-01 MED ORDER — LAMOTRIGINE 100 MG PO TABS: 100.0000 mg | ORAL_TABLET | Freq: Every day | ORAL | 1 refills | Status: DC

## 2018-09-01 NOTE — Progress Notes (Signed)
Patient ID: Megan Houston, female   DOB: 1990-12-28, 28 y.o.   MRN: [id number]  Patient is a 28 year old female who was followed up for medication management. She reported that she has been doing well and currently busy at work. She has been compliant with her medications. She reported that she is now engaged and was planning to get married in September but due to Covid they have now decided to get married in next year. She is currently living with her fianc. Patient reported that the Prozac is helping with her mood symptoms and she is taking it for two weeks before her menstrual cycle. She does not have any side effects of the medications. She sleeping well at night. Her current medications are helping her and she is not having any adverse reactions. She denied having any suicidal or homicidal ideations or plans. No other acute symptoms noted.  Plan  I will refill her medications.  She will follow up in three months earlier depending on her symptoms   I connected with patient via telemedicine application and verified that I am speaking with the correct person using two identifiers.  I discussed the limitations of evaluation and management by telemedicine and the availability of in person appointments. The patient expressed understanding and agreed to proceed.  I discussed the assessment and treatment plan with the patient. The patient was provided an opportunity to ask questions and all were answered. The patient agreed with the plan and demonstrated an understanding of the instructions.   The patient was advised to call back or seek an in-person evaluation if the symptoms worsen or if the condition fails to improve as anticipated.   I provided 15 minutes of non-face-to-face time during this encounter.

## 2018-09-15 ENCOUNTER — Other Ambulatory Visit: Payer: Self-pay | Admitting: Psychiatry

## 2018-09-15 NOTE — Telephone Encounter (Signed)
  pt called states she needs a refill on her buspar. dr. Gretel Acre had no print on rx.   busPIRone (BUSPAR) 10 MG tablet Medication Date: 09/01/2018 Department: [facility] Ordering/Authorizing: Rainey Pines, MD  Order Providers  Prescribing Provider Encounter Provider  Rainey Pines, MD Rainey Pines, MD  Outpatient Medication Detail   Disp Refills Start End   busPIRone (BUSPAR) 10 MG tablet 120 tablet 2 09/01/2018    Sig - Route: Take 1 tablet (10 mg total) by mouth at bedtime. - Oral   Class: No [place], [place]

## 2018-09-16 ENCOUNTER — Telehealth: Payer: Self-pay | Admitting: Psychiatry

## 2018-09-16 DIAGNOSIS — F3181 Bipolar II disorder: Secondary | ICD-10-CM

## 2018-09-16 DIAGNOSIS — F3281 Premenstrual dysphoric disorder: Secondary | ICD-10-CM | POA: Insufficient documentation

## 2018-09-16 MED ORDER — BUSPIRONE HCL 10 MG PO TABS: 20.0000 mg | ORAL_TABLET | Freq: Every day | ORAL | 0 refills | Status: DC

## 2018-09-16 NOTE — Telephone Encounter (Signed)
Called pt to clarify her buspar dosage - she states she is taking Buspar 20 mg at bedtime. Discussed that per Dr.Faheem's notes says she is only on 10 mg. She says she does not remember that change and has been taking 20 mg at bedtime and wants to stay on that dosage.

## 2018-09-16 NOTE — Telephone Encounter (Signed)
pt needs refills on medications she been out of buspar for 2 days.

## 2018-10-15 ENCOUNTER — Other Ambulatory Visit: Payer: Self-pay | Admitting: Psychiatry

## 2018-10-15 ENCOUNTER — Telehealth: Payer: Self-pay

## 2018-10-15 DIAGNOSIS — F3181 Bipolar II disorder: Secondary | ICD-10-CM

## 2018-10-15 DIAGNOSIS — F3281 Premenstrual dysphoric disorder: Secondary | ICD-10-CM

## 2018-10-15 MED ORDER — BUSPIRONE HCL 10 MG PO TABS: 20.0000 mg | ORAL_TABLET | Freq: Every day | ORAL | 0 refills | Status: DC

## 2018-10-15 NOTE — Telephone Encounter (Signed)
pt called states that dr. Gretel Acre did not refill her xanax either please give her a refill on all of her medications please.

## 2018-10-15 NOTE — Telephone Encounter (Signed)
Sent Buspar to pharmacy 

## 2018-10-15 NOTE — Telephone Encounter (Signed)
Patient called states that dr. Gretel Acre did not refill her buspar to states that she took the last and needs refills

## 2018-10-16 MED ORDER — ALPRAZOLAM 0.5 MG PO TABS: 0.5000 mg | ORAL_TABLET | Freq: Every evening | ORAL | 0 refills | Status: DC | PRN

## 2018-10-16 NOTE — Telephone Encounter (Signed)
pt was contact that refills on both medications were sent into pharmacy

## 2018-10-16 NOTE — Telephone Encounter (Signed)
I have sent xanax - 15 pills . Will let Dr.Faheem know to sent her medications when she is back.

## 2018-10-16 NOTE — Telephone Encounter (Signed)
pt will need refill to due until her appt in oct

## 2018-11-17 ENCOUNTER — Encounter: Payer: Self-pay | Admitting: Psychiatry

## 2018-11-17 ENCOUNTER — Ambulatory Visit (INDEPENDENT_AMBULATORY_CARE_PROVIDER_SITE_OTHER): Payer: 59 | Admitting: Psychiatry

## 2018-11-17 ENCOUNTER — Other Ambulatory Visit: Payer: Self-pay

## 2018-11-17 DIAGNOSIS — F419 Anxiety disorder, unspecified: Secondary | ICD-10-CM | POA: Diagnosis not present

## 2018-11-17 DIAGNOSIS — F3181 Bipolar II disorder: Secondary | ICD-10-CM

## 2018-11-17 DIAGNOSIS — F319 Bipolar disorder, unspecified: Secondary | ICD-10-CM | POA: Diagnosis not present

## 2018-11-17 DIAGNOSIS — F3281 Premenstrual dysphoric disorder: Secondary | ICD-10-CM

## 2018-11-17 MED ORDER — BUSPIRONE HCL 10 MG PO TABS: 20.0000 mg | ORAL_TABLET | Freq: Every day | ORAL | 2 refills | Status: DC

## 2018-11-17 MED ORDER — FLUOXETINE HCL 10 MG PO CAPS: 10.0000 mg | ORAL_CAPSULE | Freq: Every day | ORAL | 1 refills | Status: DC

## 2018-11-17 MED ORDER — LAMOTRIGINE 100 MG PO TABS: 100.0000 mg | ORAL_TABLET | Freq: Every day | ORAL | 1 refills | Status: DC

## 2018-11-17 MED ORDER — ALPRAZOLAM 0.5 MG PO TABS: 0.5000 mg | ORAL_TABLET | Freq: Every evening | ORAL | 2 refills | Status: DC | PRN

## 2018-11-17 NOTE — Progress Notes (Signed)
Patient ID: Megan Houston, female   DOB: 10-20-1990, 28 y.o.   MRN: [id number]   Patient is a 28 year old female with history of bipolar anxiety who was followed for medication management. She reported that she has been doing well and has been taking her medications as prescribed. She reported that her medications are helping her. She stated that she takes Prozac for two weeks before her menstrual cycle and it helped with her emotional dysregulation related to her menstrual cycle. She reported that she has noted the difference as if she will skip the medication it will cause her mood swings. She has also been taking Xanax on a PRN basis. She has not filled her last prescription. Patient reported that her BuSpar is helping with the anxiety at night. She is compliant with her medications. She does not want to change her medications at this time. We discussed about her diagnosis and her symptoms in detail. She denied using drugs or alcohol at this time. She denied having any suicidal or homicidal ideations or plans.  Plan I called in a prescription of Xanax With two refills to the local pharmacy. Continue other medications as prescribed. I connected with patient via telemedicine application and verified that I am speaking with the correct person using two identifiers.  I discussed the limitations of evaluation and management by telemedicine and the availability of in person appointments. The patient expressed understanding and agreed to proceed.   I discussed the assessment and treatment plan with the patient. The patient was provided an opportunity to ask questions and all were answered. The patient agreed with the plan and demonstrated an understanding of the instructions.   The patient was advised to call back or seek an in-person evaluation if the symptoms worsen or if the condition fails to improve as anticipated.   I provided 15 minutes of non-face-to-face time during this encounter.

## 2018-12-01 ENCOUNTER — Ambulatory Visit: Payer: 59 | Admitting: Psychiatry

## 2018-12-15 ENCOUNTER — Encounter: Payer: Self-pay | Admitting: Psychiatry

## 2018-12-15 ENCOUNTER — Other Ambulatory Visit: Payer: Self-pay

## 2018-12-15 ENCOUNTER — Ambulatory Visit (INDEPENDENT_AMBULATORY_CARE_PROVIDER_SITE_OTHER): Payer: 59 | Admitting: Psychiatry

## 2018-12-15 DIAGNOSIS — F419 Anxiety disorder, unspecified: Secondary | ICD-10-CM | POA: Insufficient documentation

## 2018-12-15 DIAGNOSIS — F3281 Premenstrual dysphoric disorder: Secondary | ICD-10-CM

## 2018-12-15 DIAGNOSIS — F3341 Major depressive disorder, recurrent, in partial remission: Secondary | ICD-10-CM | POA: Diagnosis not present

## 2018-12-15 MED ORDER — CLONAZEPAM 0.5 MG PO TABS: 0.5000 mg | ORAL_TABLET | Freq: Every day | ORAL | 1 refills | Status: DC | PRN

## 2018-12-15 MED ORDER — BUSPIRONE HCL 10 MG PO TABS: 20.0000 mg | ORAL_TABLET | Freq: Every day | ORAL | 0 refills | Status: DC

## 2018-12-15 MED ORDER — FLUOXETINE HCL 20 MG PO CAPS: 20.0000 mg | ORAL_CAPSULE | Freq: Every day | ORAL | 1 refills | Status: DC

## 2018-12-15 NOTE — Progress Notes (Signed)
[name] MD OP Progress Note  I connected with  Megan Houston on 12/15/18 by a video enabled telemedicine application and verified that I am speaking with the correct person using two identifiers.   I discussed the limitations of evaluation and management by telemedicine. The patient expressed understanding and agreed to proceed.    12/15/2018 3:35 PM Megan Houston  MRN:  [id number]  Chief Complaint:  " I don't think I have Bipolar."   HPI: Pt reported that she does not agree with her diagnosis if bipolar disorder. She  Denied any periods of time of elevated energy levels, elated mood levels, impulsive decision making or decreased need for sleep. She stated that she has periods of low energy levels especially 2 weeks prior to onset of her menstruation and lot of irritability around this time as well. She stated that she increased the dose of Prozac to 20 mg and feels that may be more helpful. She does not find taking lamictal to be beneficial. She takes Xanax 1-2 weeks prior to onset of menstruation and find it helpful but the effects do not last for more than a few hours.  Visit Diagnosis:    ICD-10-CM   1. MDD (major depressive disorder), recurrent, in partial remission (HCC)  F33.41 FLUoxetine (PROZAC) 20 MG capsule  2. Anxiety  F41.9 FLUoxetine (PROZAC) 20 MG capsule    clonazePAM (KLONOPIN) 0.5 MG tablet    busPIRone (BUSPAR) 10 MG tablet  3. PMDD (premenstrual dysphoric disorder)  F32.81 FLUoxetine (PROZAC) 20 MG capsule    Past Psychiatric History: DMDD, Bipolar d/o as per EMR  Past Medical History:  Past Medical History:  Diagnosis Date  . Anxiety   . Bipolar disorder ([place])   . Depression    No past surgical history on file. Family Psychiatric History: see below  Family History:  Family History  Problem Relation Age of Onset  . Hypertension Other   . Diabetes Other   . Hypertension Mother   . Bipolar disorder Mother   . Alcohol abuse Mother   . Drug abuse Mother    . Cancer Mother        cervical  . OCD Father   . Anxiety disorder Father   . Diabetes Father   . Hypertension Father   . Bipolar disorder Sister   . Drug abuse Sister   . Alcohol abuse Sister   . Liver disease Paternal Grandmother   . Diabetes Maternal Grandmother   . Bipolar disorder Maternal Grandmother   . Anxiety disorder Maternal Grandmother   . Schizophrenia Maternal Grandmother     Social History:  Social History   Socioeconomic History  . Marital status: Single    Spouse name: Not on file  . Number of children: 0  . Years of education: Not on file  . Highest education level: Some college, no degree  Occupational History  . Not on file  Social Needs  . Financial resource strain: Very hard  . Food insecurity    Worry: Often true    Inability: Often true  . Transportation needs    Medical: No    Non-medical: No  Tobacco Use  . Smoking status: Former Smoker    Types: Cigarettes    Quit date: 05/27/2014    Years since quitting: 4.5  . Smokeless tobacco: Never Used  Substance and Sexual Activity  . Alcohol use: Not Currently    Alcohol/week: 0.0 standard drinks    Comment: occasional  . Drug use: No  .  Sexual activity: Yes    Birth control/protection: Pill  Lifestyle  . Physical activity    Days per week: 4 days    Minutes per session: 30 min  . Stress: Very much  Relationships  . Social Herbalist on phone: Not on file    Gets together: Not on file    Attends religious service: Never    Active member of club or organization: No    Attends meetings of clubs or organizations: Never    Relationship status: Never married  Other Topics Concern  . Not on file  Social History Narrative  . Not on file    Allergies:  Allergies  Allergen Reactions  . Codeine Nausea And Vomiting  . Hydrocodone-Acetaminophen Nausea And Vomiting    Metabolic Disorder Labs: No results found for: HGBA1C, MPG No results found for: PROLACTIN No results found  for: CHOL, TRIG, HDL, CHOLHDL, VLDL, LDLCALC No results found for: TSH  Therapeutic Level Labs: No results found for: LITHIUM No results found for: VALPROATE No components found for:  CBMZ  Current Medications: Current Outpatient Medications  Medication Sig Dispense Refill  . busPIRone (BUSPAR) 10 MG tablet Take 2 tablets (20 mg total) by mouth at bedtime. 180 tablet 0  . clonazePAM (KLONOPIN) 0.5 MG tablet Take 1 tablet (0.5 mg total) by mouth daily as needed for anxiety. 20 tablet 1  . FLUoxetine (PROZAC) 20 MG capsule Take 1 capsule (20 mg total) by mouth daily. 30 capsule 1  . TRI-SPRINTEC 0.18/0.215/0.25 MG-35 MCG tablet Take 1 tablet by mouth daily.   10   No current facility-administered medications for this visit.     Musculoskeletal: Strength & Muscle Tone: unable to assess due to telemed visit Gait & Station: unable to assess due to telemed visit Patient leans: unable to assess due to telemed visit   Psychiatric Specialty Exam: ROS  There were no vitals taken for this visit.There is no height or weight on file to calculate BMI.  General Appearance: Well Groomed  Eye Contact:  Good  Speech:  Clear and Coherent and Normal Rate  Volume:  Normal  Mood:  Anxious  Affect:  Appropriate  Thought Process:  Goal Directed, Linear and Descriptions of Associations: Intact  Orientation:  Full (Time, Place, and Person)  Thought Content: Logical   Suicidal Thoughts:  No  Homicidal Thoughts:  No  Memory:  Recent;   Good Remote;   Good  Judgement:  Fair  Insight:  Fair  Psychomotor Activity:  Normal  Concentration:  Concentration: Good and Attention Span: Good  Recall:  Good  Fund of Knowledge: Good  Language: Good  Akathisia:  Negative  Handed:  Right  AIMS (if indicated):not done  Assets:  Communication Skills Desire for Improvement Financial Resources/Insurance [place] Talents/Skills Transportation Vocational/Educational   ADL's:  Intact  Cognition: WNL  Sleep:  Fair   Screenings: PHQ2-9     Office Visit from 07/11/2017 in [facility]  PHQ-2 Total Score  0       Assessment and Plan: 28 y/o female presenting with irritability and more depressive symptoms. Denied symptoms suggestive of hypomania and mania. Based on that, her diagnosis is being revised to MDD. Will switch to Clonazepam from xanax due to longer duration of action. Will discontinue lamictal due to lack of efficacy. Potential side effects of medication and risks vs benefits of treatment vs non-treatment were explained and discussed. All questions were answered.   1.  MDD (major depressive disorder), recurrent, in partial remission (HCC) - Increase FLUoxetine (PROZAC) 20 MG capsule; Take 1 capsule (20 mg total) by mouth daily.  Dispense: 30 capsule; Refill: 1  2. Anxiety  - FLUoxetine (PROZAC) 20 MG capsule; Take 1 capsule (20 mg total) by mouth daily.  Dispense: 30 capsule; Refill: 1 - clonazePAM (KLONOPIN) 0.5 MG tablet; Take 1 tablet (0.5 mg total) by mouth daily as needed for anxiety.  Dispense: 20 tablet; Refill: 1 - busPIRone (BUSPAR) 10 MG tablet; Take 2 tablets (20 mg total) by mouth at bedtime.  Dispense: 180 tablet; Refill: 0  3. PMDD (premenstrual dysphoric disorder)  - FLUoxetine (PROZAC) 20 MG capsule; Take 1 capsule (20 mg total) by mouth daily.  Dispense: 30 capsule; Refill: 1  F/up in 6 weeks.  [place] Crane, MD 12/15/2018, 3:35 PM

## 2018-12-19 ENCOUNTER — Other Ambulatory Visit: Payer: Self-pay

## 2018-12-19 DIAGNOSIS — Z20822 Contact with and (suspected) exposure to covid-19: Secondary | ICD-10-CM

## 2018-12-21 LAB — NOVEL CORONAVIRUS, NAA: SARS-CoV-2, NAA: NOT DETECTED

## 2019-02-02 ENCOUNTER — Ambulatory Visit (INDEPENDENT_AMBULATORY_CARE_PROVIDER_SITE_OTHER): Payer: 59 | Admitting: Psychiatry

## 2019-02-02 ENCOUNTER — Other Ambulatory Visit: Payer: Self-pay

## 2019-02-02 ENCOUNTER — Encounter: Payer: Self-pay | Admitting: Psychiatry

## 2019-02-02 DIAGNOSIS — F3342 Major depressive disorder, recurrent, in full remission: Secondary | ICD-10-CM | POA: Insufficient documentation

## 2019-02-02 DIAGNOSIS — F419 Anxiety disorder, unspecified: Secondary | ICD-10-CM

## 2019-02-02 DIAGNOSIS — F3281 Premenstrual dysphoric disorder: Secondary | ICD-10-CM

## 2019-02-02 DIAGNOSIS — F3341 Major depressive disorder, recurrent, in partial remission: Secondary | ICD-10-CM

## 2019-02-02 MED ORDER — CLONAZEPAM 0.5 MG PO TABS: 0.5000 mg | ORAL_TABLET | Freq: Every day | ORAL | 2 refills | Status: DC | PRN

## 2019-02-02 MED ORDER — BUSPIRONE HCL 10 MG PO TABS: 20.0000 mg | ORAL_TABLET | Freq: Every day | ORAL | 2 refills | Status: DC

## 2019-02-02 MED ORDER — FLUOXETINE HCL 20 MG PO CAPS: 20.0000 mg | ORAL_CAPSULE | Freq: Every day | ORAL | 2 refills | Status: DC

## 2019-02-02 NOTE — Progress Notes (Signed)
[facility] MD OP Progress Note  I connected with  Megan Houston on 02/02/19 by a video enabled telemedicine application and verified that I am speaking with the correct person using two identifiers.   I discussed the limitations of evaluation and management by telemedicine. The patient expressed understanding and agreed to proceed.    02/02/2019 2:56 PM Megan Houston  MRN:  [id number]  Chief Complaint:  " My mood is better with prozac."   HPI: Pt reported that she has been doing well by taking Prozac daily.  She stated that her irritability is much better now.  She did report feeling a little low because she is still grieving the loss of her grandmother who passed away about 8 months ago.  She stated she has been sleeping more because of the cold weather.  She informed that she is getting married next year in September and is hoping that she will be able to get off all her medications before waiting as she plans to start a family soon after that.  She denies any other issues or concerns at this time.   Visit Diagnosis:    ICD-10-CM   1. PMDD (premenstrual dysphoric disorder)  F32.81   2. MDD (major depressive disorder), recurrent, in full remission ([place])  F33.42   3. Anxiety  F41.9     Past Psychiatric History: DMDD, anxiety  Past Medical History:  Past Medical History:  Diagnosis Date  . Anxiety   . Bipolar disorder ([place])   . Depression    No past surgical history on file. Family Psychiatric History: see below  Family History:  Family History  Problem Relation Age of Onset  . Hypertension Other   . Diabetes Other   . Hypertension Mother   . Bipolar disorder Mother   . Alcohol abuse Mother   . Drug abuse Mother   . Cancer Mother        cervical  . OCD Father   . Anxiety disorder Father   . Diabetes Father   . Hypertension Father   . Bipolar disorder Sister   . Drug abuse Sister   . Alcohol abuse Sister   . Liver disease Paternal Grandmother   . Diabetes Maternal  Grandmother   . Bipolar disorder Maternal Grandmother   . Anxiety disorder Maternal Grandmother   . Schizophrenia Maternal Grandmother     Social History:  Social History   Socioeconomic History  . Marital status: Single    Spouse name: Not on file  . Number of children: 0  . Years of education: Not on file  . Highest education level: Some college, no degree  Occupational History  . Not on file  Tobacco Use  . Smoking status: Former Smoker    Types: Cigarettes    Quit date: 05/27/2014    Years since quitting: 4.6  . Smokeless tobacco: Never Used  Substance and Sexual Activity  . Alcohol use: Not Currently    Alcohol/week: 0.0 standard drinks    Comment: occasional  . Drug use: No  . Sexual activity: Yes    Birth control/protection: Pill  Other Topics Concern  . Not on file  Social History Narrative  . Not on file   Social Determinants of Health   Financial Resource Strain: High Risk  . Difficulty of Paying Living Expenses: Very hard  Food Insecurity: Food Insecurity Present  . Worried About Charity fundraiser in the Last Year: Often true  . Ran Out of Food in the  Last Year: Often true  Transportation Needs: No Transportation Needs  . Lack of Transportation (Medical): No  . Lack of Transportation (Non-Medical): No  Physical Activity: Insufficiently Active  . Days of Exercise per Week: 4 days  . Minutes of Exercise per Session: 30 min  Stress: Stress Concern Present  . Feeling of Stress : Very much  Social Connections: Unknown  . Frequency of Communication with Friends and Family: Not on file  . Frequency of Social Gatherings with Friends and Family: Not on file  . Attends Religious Services: Never  . Active Member of Clubs or Organizations: No  . Attends Archivist Meetings: Never  . Marital Status: Never married    Allergies:  Allergies  Allergen Reactions  . Codeine Nausea And Vomiting  . Hydrocodone-Acetaminophen Nausea And Vomiting     Metabolic Disorder Labs: No results found for: HGBA1C, MPG No results found for: PROLACTIN No results found for: CHOL, TRIG, HDL, CHOLHDL, VLDL, LDLCALC No results found for: TSH  Therapeutic Level Labs: No results found for: LITHIUM No results found for: VALPROATE No components found for:  CBMZ  Current Medications: Current Outpatient Medications  Medication Sig Dispense Refill  . busPIRone (BUSPAR) 10 MG tablet Take 2 tablets (20 mg total) by mouth at bedtime. 180 tablet 0  . clonazePAM (KLONOPIN) 0.5 MG tablet Take 1 tablet (0.5 mg total) by mouth daily as needed for anxiety. 20 tablet 1  . FLUoxetine (PROZAC) 20 MG capsule Take 1 capsule (20 mg total) by mouth daily. 30 capsule 1  . TRI-SPRINTEC 0.18/0.215/0.25 MG-35 MCG tablet Take 1 tablet by mouth daily.   10   No current facility-administered medications for this visit.    Musculoskeletal: Strength & Muscle Tone: unable to assess due to telemed visit Gait & Station: unable to assess due to telemed visit Patient leans: unable to assess due to telemed visit   Psychiatric Specialty Exam: ROS  There were no vitals taken for this visit.There is no height or weight on file to calculate BMI.  General Appearance: Well Groomed  Eye Contact:  Good  Speech:  Clear and Coherent and Normal Rate  Volume:  Normal  Mood:  Euthymic  Affect:  Congruent  Thought Process:  Goal Directed, Linear and Descriptions of Associations: Intact  Orientation:  Full (Time, Place, and Person)  Thought Content: Logical   Suicidal Thoughts:  No  Homicidal Thoughts:  No  Memory:  Recent;   Good Remote;   Good  Judgement:  Fair  Insight:  Fair  Psychomotor Activity:  Normal  Concentration:  Concentration: Good and Attention Span: Good  Recall:  Good  Fund of Knowledge: Good  Language: Good  Akathisia:  Negative  Handed:  Right  AIMS (if indicated):not done  Assets:  Communication Skills Desire for Improvement Financial  Resources/Insurance [place] Talents/Skills Transportation Vocational/Educational  ADL's:  Intact  Cognition: WNL  Sleep:  Good   Screenings: PHQ2-9     Office Visit from 07/11/2017 in [facility]  PHQ-2 Total Score  0       Assessment and Plan: Patient reported doing well on her current regimen.  1. PMDD (premenstrual dysphoric disorder)  - FLUoxetine (PROZAC) 20 MG capsule; Take 1 capsule (20 mg total) by mouth daily.  Dispense: 30 capsule; Refill: 2  2. MDD (major depressive disorder), recurrent, in full remission ([place])  - FLUoxetine (PROZAC) 20 MG capsule; Take 1 capsule (20 mg total) by mouth daily.  Dispense:  30 capsule; Refill: 2  3. Anxiety - busPIRone (BUSPAR) 10 MG tablet; Take 2 tablets (20 mg total) by mouth at bedtime.  Dispense: 180 tablet; Refill: 2 - FLUoxetine (PROZAC) 20 MG capsule; Take 1 capsule (20 mg total) by mouth daily.  Dispense: 30 capsule; Refill: 2 - clonazePAM (KLONOPIN) 0.5 MG tablet; Take 1 tablet (0.5 mg total) by mouth daily as needed for anxiety.  Dispense: 15 tablet; Refill: 2   Continue same medication regimen. Follow up in 3 months.   [place] Crane, MD 02/02/2019, 2:56 PM

## 2019-02-17 ENCOUNTER — Ambulatory Visit: Payer: 59 | Admitting: Psychiatry

## 2019-04-30 ENCOUNTER — Encounter: Payer: Self-pay | Admitting: Psychiatry

## 2019-04-30 ENCOUNTER — Other Ambulatory Visit: Payer: Self-pay

## 2019-04-30 ENCOUNTER — Ambulatory Visit (INDEPENDENT_AMBULATORY_CARE_PROVIDER_SITE_OTHER): Payer: 59 | Admitting: Psychiatry

## 2019-04-30 DIAGNOSIS — F3281 Premenstrual dysphoric disorder: Secondary | ICD-10-CM

## 2019-04-30 DIAGNOSIS — F419 Anxiety disorder, unspecified: Secondary | ICD-10-CM

## 2019-04-30 DIAGNOSIS — F3342 Major depressive disorder, recurrent, in full remission: Secondary | ICD-10-CM | POA: Diagnosis not present

## 2019-04-30 MED ORDER — FLUOXETINE HCL 10 MG PO CAPS: 10.0000 mg | ORAL_CAPSULE | Freq: Every day | ORAL | 0 refills | Status: DC

## 2019-04-30 MED ORDER — CLONAZEPAM 0.5 MG PO TABS: 0.5000 mg | ORAL_TABLET | Freq: Every day | ORAL | 2 refills | Status: DC | PRN

## 2019-04-30 MED ORDER — BUSPIRONE HCL 10 MG PO TABS: 10.0000 mg | ORAL_TABLET | Freq: Every day | ORAL | 0 refills | Status: DC

## 2019-04-30 NOTE — Progress Notes (Signed)
[facility] MD OP Progress Note  I connected with  Megan Houston on 04/30/19 by a video enabled telemedicine application and verified that I am speaking with the correct person using two identifiers.   I discussed the limitations of evaluation and management by telemedicine. The patient expressed understanding and agreed to proceed.    04/30/2019 3:22 PM Megan Houston  MRN:  [id number]  Chief Complaint:  " Everything is going great."   HPI: Pt reported that things are going well.  She stated that she recently got promoted at her workplace so things have been a bit stressful however she is getting the hang of it.  She stated that her bedding shopping is going well which also can be a little stressful.  She is getting married in September and she is excited.   Writer asked if she was agreeable to reducing the dose of Prozac as she had desire to do so before getting married.  Patient stated that she was agreeable to reducing the dose of Prozac.  She also asked if she could reduce the dose of BuSpar as well.  Visit Diagnosis:    ICD-10-CM   1. PMDD (premenstrual dysphoric disorder)  F32.81   2. MDD (major depressive disorder), recurrent, in full remission ([place])  F33.42   3. Anxiety  F41.9     Past Psychiatric History: Mood d/o, anxiety  Past Medical History:  Past Medical History:  Diagnosis Date  . Anxiety   . Bipolar disorder ([place])   . Depression    History reviewed. No pertinent surgical history. Family Psychiatric History: see below  Family History:  Family History  Problem Relation Age of Onset  . Hypertension Other   . Diabetes Other   . Hypertension Mother   . Bipolar disorder Mother   . Alcohol abuse Mother   . Drug abuse Mother   . Cancer Mother        cervical  . OCD Father   . Anxiety disorder Father   . Diabetes Father   . Hypertension Father   . Bipolar disorder Sister   . Drug abuse Sister   . Alcohol abuse Sister   . Liver disease Paternal Grandmother   .  Diabetes Maternal Grandmother   . Bipolar disorder Maternal Grandmother   . Anxiety disorder Maternal Grandmother   . Schizophrenia Maternal Grandmother     Social History:  Social History   Socioeconomic History  . Marital status: Single    Spouse name: Not on file  . Number of children: 0  . Years of education: Not on file  . Highest education level: Some college, no degree  Occupational History  . Not on file  Tobacco Use  . Smoking status: Former Smoker    Types: Cigarettes    Quit date: 05/27/2014    Years since quitting: 4.9  . Smokeless tobacco: Never Used  Substance and Sexual Activity  . Alcohol use: Not Currently    Alcohol/week: 0.0 standard drinks    Comment: occasional  . Drug use: No  . Sexual activity: Yes    Birth control/protection: Pill  Other Topics Concern  . Not on file  Social History Narrative  . Not on file   Social Determinants of Health   Financial Resource Strain: High Risk  . Difficulty of Paying Living Expenses: Very hard  Food Insecurity: Food Insecurity Present  . Worried About Charity fundraiser in the Last Year: Often true  . Ran Out of Food in  the Last Year: Often true  Transportation Needs: No Transportation Needs  . Lack of Transportation (Medical): No  . Lack of Transportation (Non-Medical): No  Physical Activity: Insufficiently Active  . Days of Exercise per Week: 4 days  . Minutes of Exercise per Session: 30 min  Stress: Stress Concern Present  . Feeling of Stress : Very much  Social Connections: Unknown  . Frequency of Communication with Friends and Family: Not on file  . Frequency of Social Gatherings with Friends and Family: Not on file  . Attends Religious Services: Never  . Active Member of Clubs or Organizations: No  . Attends Archivist Meetings: Never  . Marital Status: Never married    Allergies:  Allergies  Allergen Reactions  . Codeine Nausea And Vomiting  . Hydrocodone-Acetaminophen Nausea And  Vomiting    Metabolic Disorder Labs: No results found for: HGBA1C, MPG No results found for: PROLACTIN No results found for: CHOL, TRIG, HDL, CHOLHDL, VLDL, LDLCALC No results found for: TSH  Therapeutic Level Labs: No results found for: LITHIUM No results found for: VALPROATE No components found for:  CBMZ  Current Medications: Current Outpatient Medications  Medication Sig Dispense Refill  . busPIRone (BUSPAR) 10 MG tablet Take 2 tablets (20 mg total) by mouth at bedtime. 180 tablet 2  . clonazePAM (KLONOPIN) 0.5 MG tablet Take 1 tablet (0.5 mg total) by mouth daily as needed for anxiety. 15 tablet 2  . FLUoxetine (PROZAC) 20 MG capsule Take 1 capsule (20 mg total) by mouth daily. 30 capsule 2  . TRI-SPRINTEC 0.18/0.215/0.25 MG-35 MCG tablet Take 1 tablet by mouth daily.   10   No current facility-administered medications for this visit.    Musculoskeletal: Strength & Muscle Tone: unable to assess due to telemed visit Gait & Station: unable to assess due to telemed visit Patient leans: unable to assess due to telemed visit   Psychiatric Specialty Exam: ROS  There were no vitals taken for this visit.There is no height or weight on file to calculate BMI.  General Appearance: Well Groomed  Eye Contact:  Good  Speech:  Clear and Coherent and Normal Rate  Volume:  Normal  Mood:  Euthymic  Affect:  Congruent  Thought Process:  Goal Directed, Linear and Descriptions of Associations: Intact  Orientation:  Full (Time, Place, and Person)  Thought Content: Logical   Suicidal Thoughts:  No  Homicidal Thoughts:  No  Memory:  Recent;   Good Remote;   Good  Judgement:  Fair  Insight:  Fair  Psychomotor Activity:  Normal  Concentration:  Concentration: Good and Attention Span: Good  Recall:  Good  Fund of Knowledge: Good  Language: Good  Akathisia:  Negative  Handed:  Right  AIMS (if indicated):not done  Assets:  Communication Skills Desire for Improvement Financial  Resources/Insurance [place] Talents/Skills Transportation Vocational/Educational  ADL's:  Intact  Cognition: WNL  Sleep:  Good   Screenings: PHQ2-9     Office Visit from 07/11/2017 in [facility]  PHQ-2 Total Score  0       Assessment and Plan: Patient reported doing well on her current regimen.  Will reduce dose of Prozac to 10 mg daily and BuSpar to 10 mg daily as per patient's plan of being off her medications by the time she gets married in September.  1. PMDD (premenstrual dysphoric disorder)  - FLUoxetine (PROZAC) 10 MG capsule; Take 1 capsule (10 mg total) by mouth daily.  Dispense: 90 capsule; Refill: 0  2. MDD (major depressive disorder), recurrent, in full remission ([place])  - FLUoxetine (PROZAC) 10 MG capsule; Take 1 capsule (10 mg total) by mouth daily.  Dispense: 90 capsule; Refill: 0  3. Anxiety  - busPIRone (BUSPAR) 10 MG tablet; Take 1 tablet (10 mg total) by mouth daily.  Dispense: 90 tablet; Refill: 0 - clonazePAM (KLONOPIN) 0.5 MG tablet; Take 1 tablet (0.5 mg total) by mouth daily as needed for anxiety.  Dispense: 15 tablet; Refill: 2     Follow up in 3 months.   [place] Crane, MD 04/30/2019, 3:22 PM

## 2019-05-12 ENCOUNTER — Other Ambulatory Visit: Payer: Self-pay

## 2019-05-12 ENCOUNTER — Ambulatory Visit: Payer: 59 | Attending: Internal Medicine

## 2019-05-12 DIAGNOSIS — Z20822 Contact with and (suspected) exposure to covid-19: Secondary | ICD-10-CM | POA: Insufficient documentation

## 2019-05-14 LAB — SARS-COV-2, NAA 2 DAY TAT

## 2019-05-14 LAB — NOVEL CORONAVIRUS, NAA: SARS-CoV-2, NAA: NOT DETECTED

## 2019-07-23 ENCOUNTER — Encounter (HOSPITAL_COMMUNITY): Payer: Self-pay | Admitting: Psychiatry

## 2019-07-23 ENCOUNTER — Other Ambulatory Visit: Payer: Self-pay

## 2019-07-23 ENCOUNTER — Telehealth: Payer: 59 | Admitting: Psychiatry

## 2019-07-23 ENCOUNTER — Telehealth (INDEPENDENT_AMBULATORY_CARE_PROVIDER_SITE_OTHER): Payer: 59 | Admitting: Psychiatry

## 2019-07-23 DIAGNOSIS — F3281 Premenstrual dysphoric disorder: Secondary | ICD-10-CM | POA: Diagnosis not present

## 2019-07-23 DIAGNOSIS — F3342 Major depressive disorder, recurrent, in full remission: Secondary | ICD-10-CM

## 2019-07-23 DIAGNOSIS — F419 Anxiety disorder, unspecified: Secondary | ICD-10-CM

## 2019-07-23 MED ORDER — CLONAZEPAM 0.5 MG PO TABS: 0.5000 mg | ORAL_TABLET | Freq: Every day | ORAL | 2 refills | Status: DC | PRN

## 2019-07-23 MED ORDER — BUSPIRONE HCL 10 MG PO TABS: 10.0000 mg | ORAL_TABLET | Freq: Every day | ORAL | 0 refills | Status: DC

## 2019-07-23 MED ORDER — FLUOXETINE HCL 20 MG PO CAPS: 20.0000 mg | ORAL_CAPSULE | Freq: Every day | ORAL | 0 refills | Status: DC

## 2019-07-23 NOTE — Progress Notes (Signed)
[facility] MD OP Progress Note  Virtual Visit via Video Note  I connected with Megan Houston on 07/23/19 at  3:00 PM EDT by a video enabled telemedicine application and verified that I am speaking with the correct person using two identifiers.  Location: Patient: Home Provider: Clinic   I discussed the limitations of evaluation and management by telemedicine and the availability of in person appointments. The patient expressed understanding and agreed to proceed.  I provided 15 minutes of non-face-to-face time during this encounter.      07/23/2019 2:58 PM Megan Houston  MRN:  [id number]  Chief Complaint:  " I have been very anxious lately."   HPI: Patient reported that for the past month she has been feeling anxious and has been under a lot of stress.  She informed that after being promoted at work her job responsibilities have almost doubled and that has been quite stressful for her.  She also informed that her wedding planning is still ongoing with the wedding planned for September 4.  She informed that she stopped taking the buspirone about a month ago and thinks that may also be causing her to feel more anxious than usual.  She also feels more irritable than usual and has been taking out the anger on herself.  She denies any suicidal ideations. She was agreeable and the writer recommended that we can go back to the original dose of Prozac 20 mg as increasing the dose was quite helpful in the past. She also informed that she has been taking clonazepam more frequently than usual and requested refills for that as well.   Visit Diagnosis:    ICD-10-CM   1. MDD (major depressive disorder), recurrent, in full remission ([place])  F33.42   2. Anxiety  F41.9   3. PMDD (premenstrual dysphoric disorder)  F32.81     Past Psychiatric History: Mood d/o, anxiety  Past Medical History:  Past Medical History:  Diagnosis Date  . Anxiety   . Bipolar disorder ([place])   . Depression    No past  surgical history on file. Family Psychiatric History: see below  Family History:  Family History  Problem Relation Age of Onset  . Hypertension Other   . Diabetes Other   . Hypertension Mother   . Bipolar disorder Mother   . Alcohol abuse Mother   . Drug abuse Mother   . Cancer Mother        cervical  . OCD Father   . Anxiety disorder Father   . Diabetes Father   . Hypertension Father   . Bipolar disorder Sister   . Drug abuse Sister   . Alcohol abuse Sister   . Liver disease Paternal Grandmother   . Diabetes Maternal Grandmother   . Bipolar disorder Maternal Grandmother   . Anxiety disorder Maternal Grandmother   . Schizophrenia Maternal Grandmother     Social History:  Social History   Socioeconomic History  . Marital status: Single    Spouse name: Not on file  . Number of children: 0  . Years of education: Not on file  . Highest education level: Some college, no degree  Occupational History  . Not on file  Tobacco Use  . Smoking status: Former Smoker    Types: Cigarettes    Quit date: 05/27/2014    Years since quitting: 5.1  . Smokeless tobacco: Never Used  Substance and Sexual Activity  . Alcohol use: Not Currently    Alcohol/week: 0.0 standard drinks  Comment: occasional  . Drug use: No  . Sexual activity: Yes    Birth control/protection: Pill  Other Topics Concern  . Not on file  Social History Narrative  . Not on file   Social Determinants of Health   Financial Resource Strain:   . Difficulty of Paying Living Expenses:   Food Insecurity:   . Worried About Charity fundraiser in the Last Year:   . Arboriculturist in the Last Year:   Transportation Needs:   . Film/video editor (Medical):   Marland Kitchen Lack of Transportation (Non-Medical):   Physical Activity:   . Days of Exercise per Week:   . Minutes of Exercise per Session:   Stress:   . Feeling of Stress :   Social Connections:   . Frequency of Communication with Friends and Family:   .  Frequency of Social Gatherings with Friends and Family:   . Attends Religious Services:   . Active Member of Clubs or Organizations:   . Attends Archivist Meetings:   Marland Kitchen Marital Status:     Allergies:  Allergies  Allergen Reactions  . Codeine Nausea And Vomiting  . Hydrocodone-Acetaminophen Nausea And Vomiting    Metabolic Disorder Labs: No results found for: HGBA1C, MPG No results found for: PROLACTIN No results found for: CHOL, TRIG, HDL, CHOLHDL, VLDL, LDLCALC No results found for: TSH  Therapeutic Level Labs: No results found for: LITHIUM No results found for: VALPROATE No components found for:  CBMZ  Current Medications: Current Outpatient Medications  Medication Sig Dispense Refill  . busPIRone (BUSPAR) 10 MG tablet Take 1 tablet (10 mg total) by mouth daily. 90 tablet 0  . clonazePAM (KLONOPIN) 0.5 MG tablet Take 1 tablet (0.5 mg total) by mouth daily as needed for anxiety. 15 tablet 2  . FLUoxetine (PROZAC) 10 MG capsule Take 1 capsule (10 mg total) by mouth daily. 90 capsule 0  . TRI-SPRINTEC 0.18/0.215/0.25 MG-35 MCG tablet Take 1 tablet by mouth daily.   10   No current facility-administered medications for this visit.    Musculoskeletal: Strength & Muscle Tone: unable to assess due to telemed visit Gait & Station: unable to assess due to telemed visit Patient leans: unable to assess due to telemed visit   Psychiatric Specialty Exam: ROS  There were no vitals taken for this visit.There is no height or weight on file to calculate BMI.  General Appearance: Well Groomed  Eye Contact:  Good  Speech:  Clear and Coherent and Normal Rate  Volume:  Normal  Mood:  Anxious  Affect:  Congruent  Thought Process:  Goal Directed, Linear and Descriptions of Associations: Intact  Orientation:  Full (Time, Place, and Person)  Thought Content: Logical   Suicidal Thoughts:  No  Homicidal Thoughts:  No  Memory:  Recent;   Good Remote;   Good  Judgement:   Fair  Insight:  Fair  Psychomotor Activity:  Normal  Concentration:  Concentration: Good and Attention Span: Good  Recall:  Good  Fund of Knowledge: Good  Language: Good  Akathisia:  Negative  Handed:  Right  AIMS (if indicated):not done  Assets:  Communication Skills Desire for Improvement Financial Resources/Insurance [place] Talents/Skills Transportation Vocational/Educational  ADL's:  Intact  Cognition: WNL  Sleep:  Good   Screenings: PHQ2-9     Office Visit from 07/11/2017 in [facility]  PHQ-2 Total Score  0       Assessment and  Plan: Patient reported that she has been under stress due to several reasons lately.  She was agreeable to going back to the dose of 20 mg of Prozac and also was still go back to taking BuSpar regularly.  1. MDD (major depressive disorder), recurrent, in full remission ([place])  - busPIRone (BUSPAR) 10 MG tablet; Take 1 tablet (10 mg total) by mouth daily.  Dispense: 90 tablet; Refill: 0 - clonazePAM (KLONOPIN) 0.5 MG tablet; Take 1 tablet (0.5 mg total) by mouth daily as needed for anxiety.  Dispense: 20 tablet; Refill: 2 - FLUoxetine (PROZAC) 20 MG capsule; Take 1 capsule (20 mg total) by mouth daily.  Dispense: 90 capsule; Refill: 0  2. Anxiety  - busPIRone (BUSPAR) 10 MG tablet; Take 1 tablet (10 mg total) by mouth daily.  Dispense: 90 tablet; Refill: 0 - clonazePAM (KLONOPIN) 0.5 MG tablet; Take 1 tablet (0.5 mg total) by mouth daily as needed for anxiety.  Dispense: 20 tablet; Refill: 2 - FLUoxetine (PROZAC) 20 MG capsule; Take 1 capsule (20 mg total) by mouth daily.  Dispense: 90 capsule; Refill: 0  3. PMDD (premenstrual dysphoric disorder)  - FLUoxetine (PROZAC) 20 MG capsule; Take 1 capsule (20 mg total) by mouth daily.  Dispense: 90 capsule; Refill: 0   Follow up in 2 months.   Nevada Crane, MD 07/23/2019, 2:58 PM

## 2019-08-12 IMAGING — DX DG CHEST 2V
2 series · 2 of 2 positions shown · non-contrast
Comparison: Chest x-ray of October 16, 2012

CLINICAL DATA: Sore throat, fever, fatigue, neck and head pain
began 6 weeks ago.

EXAM:
CHEST - 2 VIEW

[chest pa]
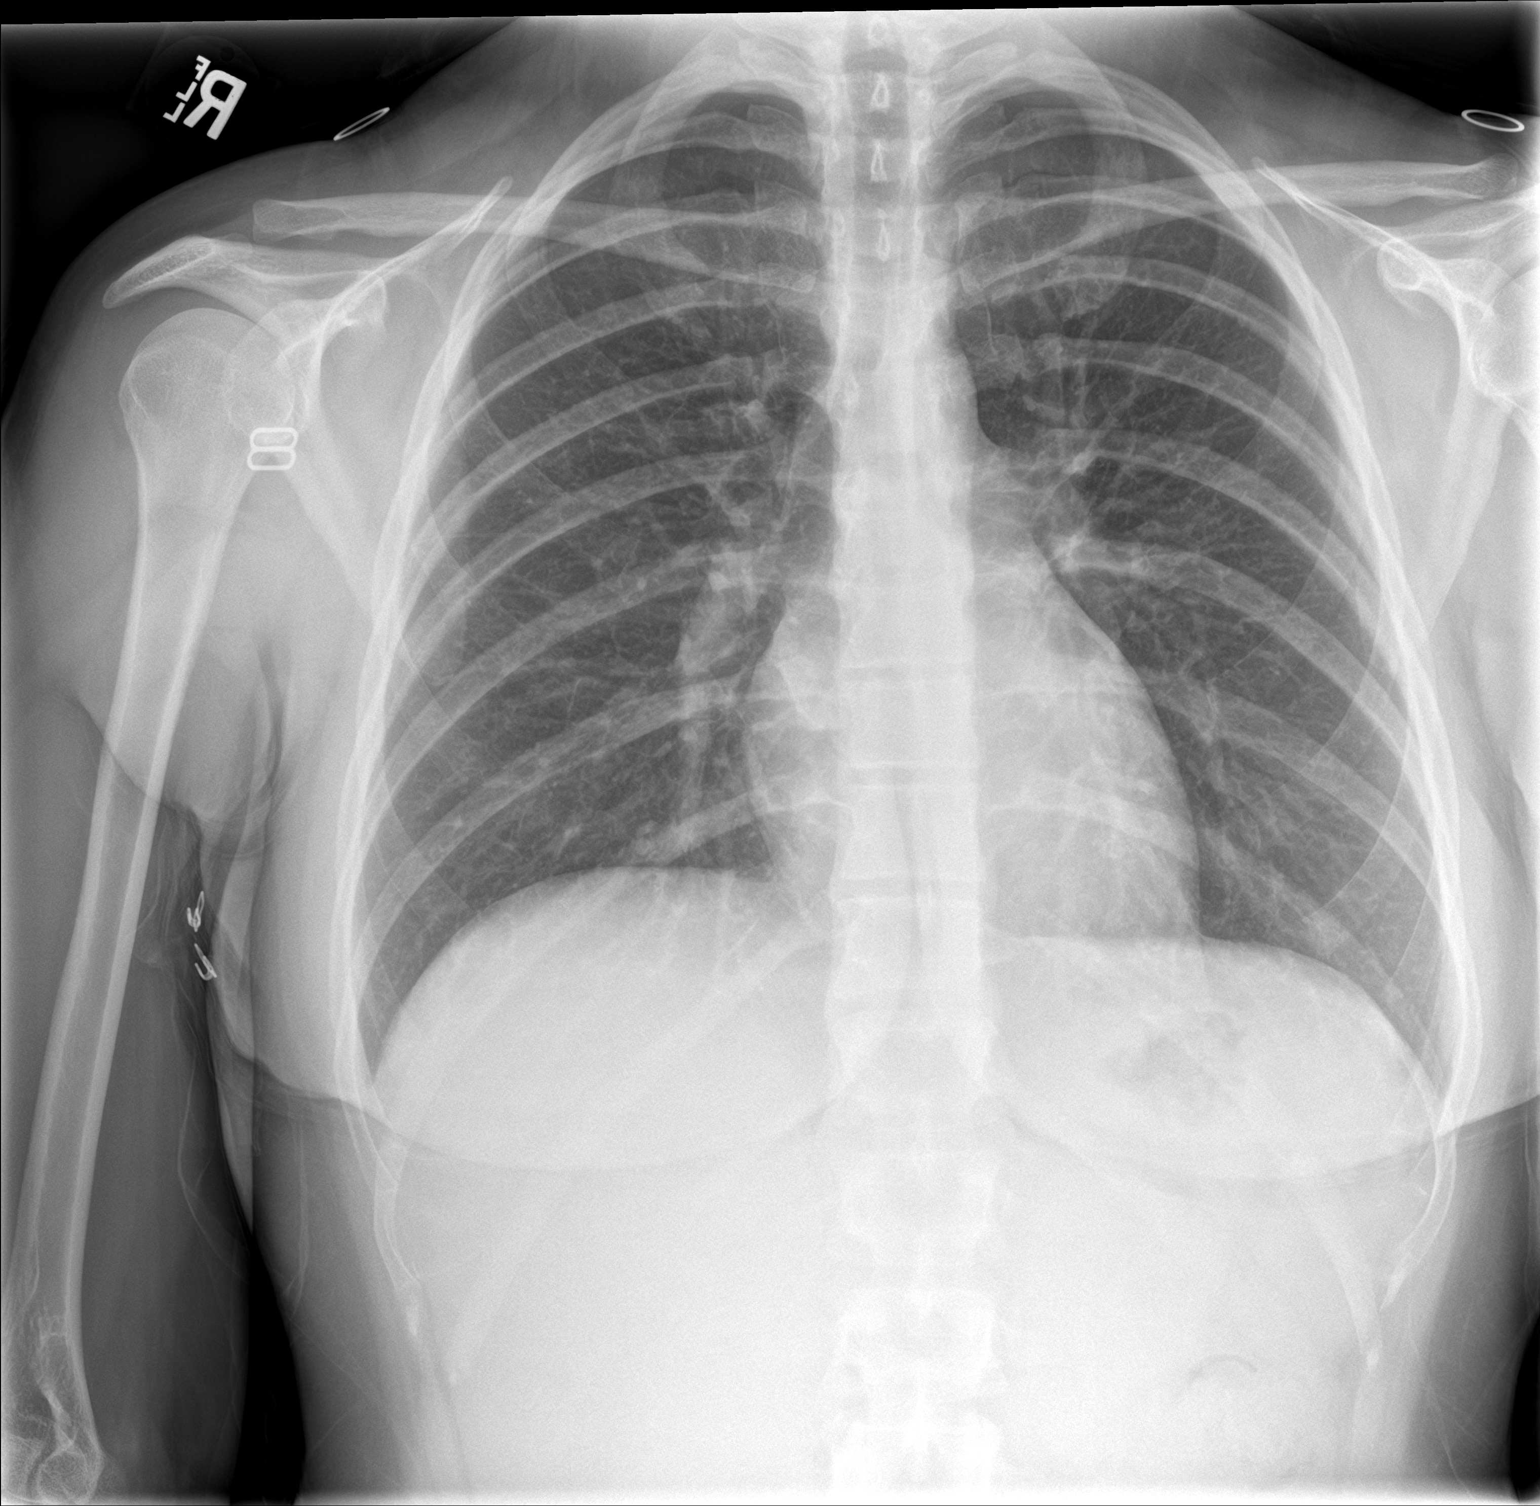

[chest lat]
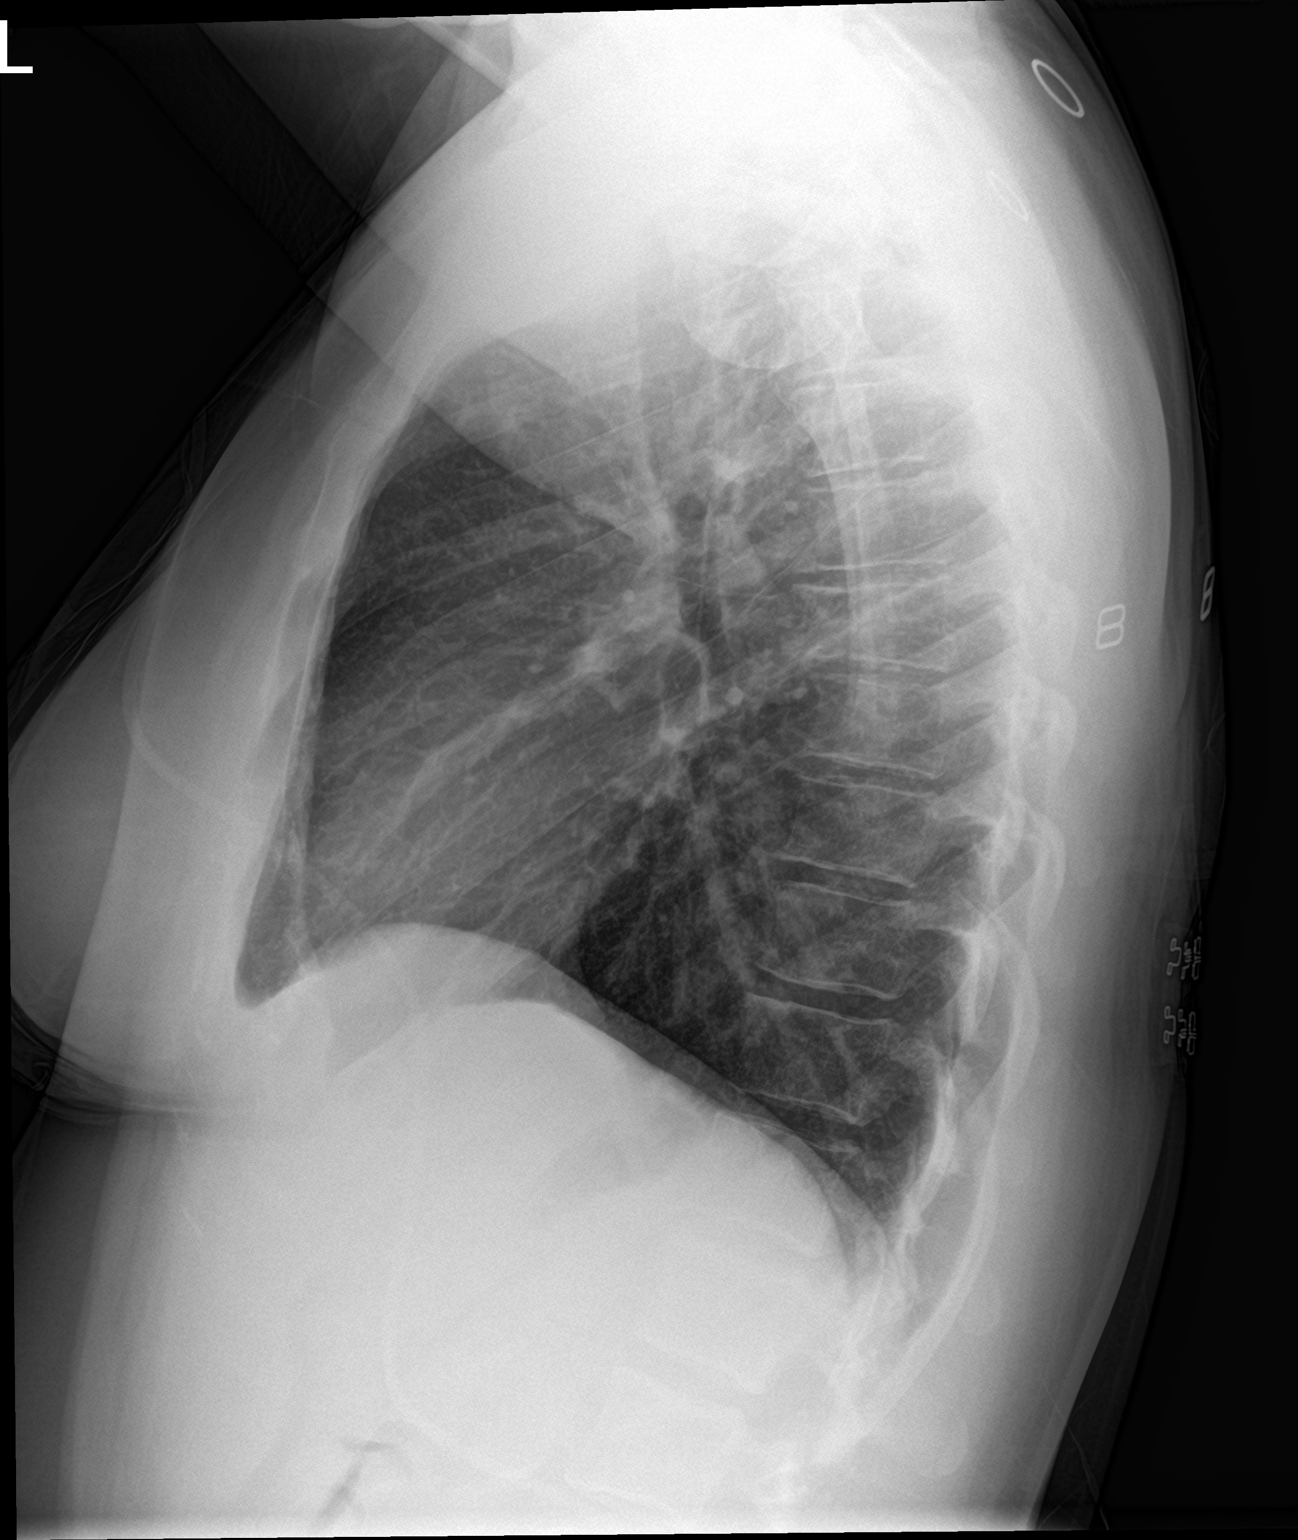

[2 of 2 positions shown; findings below may reference images not displayed]

FINDINGS: The lungs are adequately inflated and clear. The heart and pulmonary
vascularity are normal. The mediastinum is normal in width. The
trachea is midline. The bony thorax exhibits no acute abnormality.
IMPRESSION: There is no active cardiopulmonary disease.  The

## 2019-09-15 ENCOUNTER — Other Ambulatory Visit: Payer: Self-pay

## 2019-09-15 ENCOUNTER — Encounter (HOSPITAL_COMMUNITY): Payer: Self-pay | Admitting: Psychiatry

## 2019-09-15 ENCOUNTER — Telehealth (INDEPENDENT_AMBULATORY_CARE_PROVIDER_SITE_OTHER): Payer: 59 | Admitting: Psychiatry

## 2019-09-15 DIAGNOSIS — F419 Anxiety disorder, unspecified: Secondary | ICD-10-CM | POA: Diagnosis not present

## 2019-09-15 DIAGNOSIS — F3281 Premenstrual dysphoric disorder: Secondary | ICD-10-CM

## 2019-09-15 DIAGNOSIS — F3342 Major depressive disorder, recurrent, in full remission: Secondary | ICD-10-CM | POA: Diagnosis not present

## 2019-09-15 MED ORDER — CLONAZEPAM 0.5 MG PO TABS: 0.5000 mg | ORAL_TABLET | Freq: Every day | ORAL | 2 refills | Status: DC | PRN

## 2019-09-15 MED ORDER — FLUOXETINE HCL 20 MG PO CAPS: 20.0000 mg | ORAL_CAPSULE | Freq: Every day | ORAL | 1 refills | Status: DC

## 2019-09-15 NOTE — Progress Notes (Signed)
[facility] [facility] MD OP Progress Note  Virtual Visit via Video Note  I connected with Megan Houston on 09/15/19 at 10:40 AM EDT by a video enabled telemedicine application and verified that I am speaking with the correct person using two identifiers.  Location: Patient: Home Provider: Clinic   I discussed the limitations of evaluation and management by telemedicine and the availability of in person appointments. The patient expressed understanding and agreed to proceed.  I provided 16 minutes of non-face-to-face time during this encounter.      09/15/2019 10:35 AM Megan Houston  MRN:  [id number]  Chief Complaint:  " I still have a lot of anxiety."   HPI: Patient reported she continues to have anxiety and stress related to her work as she has now a lot of added responsibilities on her plate.  She is also anxious about the wedding preparation which is coming up in September.  She stated that she has to take Clonazepam almost daily lately.  She mostly takes it in the morning however sometimes needs an extra dose the evening as well.  She informed that she no longer takes the buspirone anymore. Patient stated that she has not spoken to her supervisor about her stressful work schedule however is open to do that because it is getting overwhelming.  Visit Diagnosis:    ICD-10-CM   1. MDD (major depressive disorder), recurrent, in full remission ([place])  F33.42   2. Anxiety  F41.9   3. PMDD (premenstrual dysphoric disorder)  F32.81     Past Psychiatric History: Mood d/o, anxiety  Past Medical History:  Past Medical History:  Diagnosis Date  . Anxiety   . Bipolar disorder ([place])   . Depression    No past surgical history on file. Family Psychiatric History: see below  Family History:  Family History  Problem Relation Age of Onset  . Hypertension Other   . Diabetes Other   . Hypertension Mother   . Bipolar disorder Mother   . Alcohol abuse Mother   . Drug abuse Mother   . Cancer Mother         cervical  . OCD Father   . Anxiety disorder Father   . Diabetes Father   . Hypertension Father   . Bipolar disorder Sister   . Drug abuse Sister   . Alcohol abuse Sister   . Liver disease Paternal Grandmother   . Diabetes Maternal Grandmother   . Bipolar disorder Maternal Grandmother   . Anxiety disorder Maternal Grandmother   . Schizophrenia Maternal Grandmother     Social History:  Social History   Socioeconomic History  . Marital status: Single    Spouse name: Not on file  . Number of children: 0  . Years of education: Not on file  . Highest education level: Some college, no degree  Occupational History  . Not on file  Tobacco Use  . Smoking status: Former Smoker    Types: Cigarettes    Quit date: 05/27/2014    Years since quitting: 5.3  . Smokeless tobacco: Never Used  Substance and Sexual Activity  . Alcohol use: Not Currently    Alcohol/week: 0.0 standard drinks    Comment: occasional  . Drug use: No  . Sexual activity: Yes    Birth control/protection: Pill  Other Topics Concern  . Not on file  Social History Narrative  . Not on file   Social Determinants of Health   Financial Resource Strain:   . Difficulty of  Paying Living Expenses:   Food Insecurity:   . Worried About Charity fundraiser in the Last Year:   . Arboriculturist in the Last Year:   Transportation Needs:   . Film/video editor (Medical):   Marland Kitchen Lack of Transportation (Non-Medical):   Physical Activity:   . Days of Exercise per Week:   . Minutes of Exercise per Session:   Stress:   . Feeling of Stress :   Social Connections:   . Frequency of Communication with Friends and Family:   . Frequency of Social Gatherings with Friends and Family:   . Attends Religious Services:   . Active Member of Clubs or Organizations:   . Attends Archivist Meetings:   Marland Kitchen Marital Status:     Allergies:  Allergies  Allergen Reactions  . Codeine Nausea And Vomiting  .  Hydrocodone-Acetaminophen Nausea And Vomiting    Metabolic Disorder Labs: No results found for: HGBA1C, MPG No results found for: PROLACTIN No results found for: CHOL, TRIG, HDL, CHOLHDL, VLDL, LDLCALC No results found for: TSH  Therapeutic Level Labs: No results found for: LITHIUM No results found for: VALPROATE No components found for:  CBMZ  Current Medications: Current Outpatient Medications  Medication Sig Dispense Refill  . busPIRone (BUSPAR) 10 MG tablet Take 1 tablet (10 mg total) by mouth daily. 90 tablet 0  . clonazePAM (KLONOPIN) 0.5 MG tablet Take 1 tablet (0.5 mg total) by mouth daily as needed for anxiety. 20 tablet 2  . FLUoxetine (PROZAC) 20 MG capsule Take 1 capsule (20 mg total) by mouth daily. 90 capsule 0  . TRI-SPRINTEC 0.18/0.215/0.25 MG-35 MCG tablet Take 1 tablet by mouth daily.   10   No current facility-administered medications for this visit.    Musculoskeletal: Strength & Muscle Tone: unable to assess due to telemed visit Gait & Station: unable to assess due to telemed visit Patient leans: unable to assess due to telemed visit   Psychiatric Specialty Exam: ROS  There were no vitals taken for this visit.There is no height or weight on file to calculate BMI.  General Appearance: Well Groomed  Eye Contact:  Good  Speech:  Clear and Coherent and Normal Rate  Volume:  Normal  Mood:  Anxious  Affect:  Congruent  Thought Process:  Goal Directed, Linear and Descriptions of Associations: Intact  Orientation:  Full (Time, Place, and Person)  Thought Content: Logical   Suicidal Thoughts:  No  Homicidal Thoughts:  No  Memory:  Recent;   Good Remote;   Good  Judgement:  Fair  Insight:  Fair  Psychomotor Activity:  Normal  Concentration:  Concentration: Good and Attention Span: Good  Recall:  Good  Fund of Knowledge: Good  Language: Good  Akathisia:  Negative  Handed:  Right  AIMS (if indicated):not done  Assets:  Communication Skills Desire for  Improvement Financial Resources/Insurance [place] Talents/Skills Transportation Vocational/Educational  ADL's:  Intact  Cognition: WNL  Sleep:  Good   Screenings: PHQ2-9     [facility] Visit from 07/11/2017 in [facility]  PHQ-2 Total Score 0       Assessment and Plan: Patient continues to deal with stress due to increased workload as well as her upcoming wedding in September.  She was advised to talk to her supervisor regarding her work-related stress.   1. MDD (major depressive disorder), recurrent, in full remission ([place])  - clonazePAM (KLONOPIN) 0.5 MG tablet; Take 1  tablet (0.5 mg total) by mouth daily as needed for anxiety.  Dispense: 20 tablet; Refill: 2 - FLUoxetine (PROZAC) 20 MG capsule; Take 1 capsule (20 mg total) by mouth daily.  Dispense: 90 capsule; Refill: 0  2. Anxiety  - clonazePAM (KLONOPIN) 0.5 MG tablet; Take 1 tablet (0.5 mg total) by mouth daily as needed for anxiety.  Dispense: 20 tablet; Refill: 2 - FLUoxetine (PROZAC) 20 MG capsule; Take 1 capsule (20 mg total) by mouth daily.  Dispense: 90 capsule; Refill: 0  3. PMDD (premenstrual dysphoric disorder)  - FLUoxetine (PROZAC) 20 MG capsule; Take 1 capsule (20 mg total) by mouth daily.  Dispense: 90 capsule; Refill: 0   Follow up in 3 months.   [place] Crane, MD 09/15/2019, 10:35 AM

## 2019-09-18 ENCOUNTER — Telehealth (HOSPITAL_COMMUNITY): Payer: 59 | Admitting: Psychiatry

## 2019-09-22 ENCOUNTER — Telehealth (HOSPITAL_COMMUNITY): Payer: 59 | Admitting: Psychiatry

## 2019-10-26 ENCOUNTER — Other Ambulatory Visit: Payer: Self-pay

## 2019-10-26 ENCOUNTER — Ambulatory Visit
Admission: EM | Admit: 2019-10-26 | Discharge: 2019-10-26 | Disposition: A | Discharge status: Home / Self Care | Payer: 59 | Attending: Emergency Medicine | Admitting: Emergency Medicine

## 2019-10-26 DIAGNOSIS — J069 Acute upper respiratory infection, unspecified: Secondary | ICD-10-CM | POA: Diagnosis not present

## 2019-10-26 DIAGNOSIS — Z1152 Encounter for screening for COVID-19: Secondary | ICD-10-CM

## 2019-10-26 MED ORDER — BENZONATATE 100 MG PO CAPS: 100.0000 mg | ORAL_CAPSULE | Freq: Three times a day (TID) | ORAL | 0 refills | Status: DC

## 2019-10-26 MED ORDER — AZITHROMYCIN 250 MG PO TABS: 250.0000 mg | ORAL_TABLET | Freq: Every day | ORAL | 0 refills | Status: DC

## 2019-10-26 MED ORDER — CETIRIZINE HCL 10 MG PO TABS: 10.0000 mg | ORAL_TABLET | Freq: Every day | ORAL | 0 refills | Status: DC

## 2019-10-26 MED ORDER — PREDNISONE 10 MG PO TABS: 20.0000 mg | ORAL_TABLET | Freq: Every day | ORAL | 0 refills | Status: DC

## 2019-10-26 NOTE — ED Provider Notes (Signed)
[facility]   [id number] 10/26/19 Arrival Time: 0805   CC: COVID symptoms  SUBJECTIVE: History from: patient.  Megan Houston is a 29 y.o. female who presented to the urgent care for complaint of cough, nasal congestion with green nasal discharge for the past 1 week.  Denies sick exposure to COVID, flu or strep.  Denies recent travel.  Has tried OTC medication without relief.  Denies aggravating factors.  Denies previous symptoms in the past.   Denies fever, chills, fatigue, sinus pain, rhinorrhea, sore throat, SOB, wheezing, chest pain, nausea, changes in bowel or bladder habits.     ROS: As per HPI.  All other pertinent ROS negative.     Past Medical History:  Diagnosis Date   Anxiety    Bipolar disorder ([place])    Depression    History reviewed. No pertinent surgical history. Allergies  Allergen Reactions   Codeine Nausea And Vomiting   Hydrocodone-Acetaminophen Nausea And Vomiting   No current facility-administered medications on file prior to encounter.   Current Outpatient Medications on File Prior to Encounter  Medication Sig Dispense Refill   clonazePAM (KLONOPIN) 0.5 MG tablet Take 1 tablet (0.5 mg total) by mouth daily as needed for anxiety. 30 tablet 2   FLUoxetine (PROZAC) 20 MG capsule Take 1 capsule (20 mg total) by mouth daily. 90 capsule 1   TRI-SPRINTEC 0.18/0.215/0.25 MG-35 MCG tablet Take 1 tablet by mouth daily.   10   Social History   Socioeconomic History   Marital status: Single    Spouse name: Not on file   Number of children: 0   Years of education: Not on file   Highest education level: Some college, no degree  Occupational History   Not on file  Tobacco Use   Smoking status: Former Smoker    Types: Cigarettes    Quit date: 05/27/2014    Years since quitting: 5.4   Smokeless tobacco: Never Used  Substance and Sexual Activity   Alcohol use: Not Currently    Alcohol/week: 0.0 standard drinks    Comment: occasional     Drug use: No   Sexual activity: Yes    Birth control/protection: Pill  Other Topics Concern   Not on file  Social History Narrative   Not on file   Social Determinants of Health   Financial Resource Strain:    Difficulty of Paying Living Expenses: Not on file  Food Insecurity:    Worried About Charity fundraiser in the Last Year: Not on file   [employer] of Food in the Last Year: Not on file  Transportation Needs:    Lack of Transportation (Medical): Not on file   Lack of Transportation (Non-Medical): Not on file  Physical Activity:    Days of Exercise per Week: Not on file   Minutes of Exercise per Session: Not on file  Stress:    Feeling of Stress : Not on file  Social Connections:    Frequency of Communication with Friends and Family: Not on file   Frequency of Social Gatherings with Friends and Family: Not on file   Attends Religious Services: Not on file   Active Member of Clubs or Organizations: Not on file   Attends Archivist Meetings: Not on file   Marital Status: Not on file  Intimate Partner Violence:    Fear of Current or Ex-Partner: Not on file   Emotionally Abused: Not on file   Physically Abused: Not on file  Sexually Abused: Not on file   Family History  Problem Relation Age of Onset   Hypertension Other    Diabetes Other    Hypertension Mother    Bipolar disorder Mother    Alcohol abuse Mother    Drug abuse Mother    Cancer Mother        cervical   OCD Father    Anxiety disorder Father    Diabetes Father    Hypertension Father    Bipolar disorder Sister    Drug abuse Sister    Alcohol abuse Sister    Liver disease Paternal Grandmother    Diabetes Maternal Grandmother    Bipolar disorder Maternal Grandmother    Anxiety disorder Maternal Grandmother    Schizophrenia Maternal Grandmother     OBJECTIVE:  Vitals:   10/26/19 0817  BP: 119/88  Pulse: 90  Resp: 20  Temp: 99.6 F (37.6 C)   SpO2: 95%     General appearance: alert; appears fatigued, but nontoxic; speaking in full sentences and tolerating own secretions HEENT: NCAT; Ears: EACs clear, TMs pearly gray; Eyes: PERRL.  EOM grossly intact. Sinuses: nontender; Nose: nares patent without rhinorrhea, Throat: oropharynx clear, tonsils non erythematous or enlarged, uvula midline  Neck: supple without LAD Lungs: unlabored respirations, symmetrical air entry; cough: moderate; no respiratory distress; CTAB Heart: regular rate and rhythm.  Radial pulses 2+ symmetrical bilaterally Skin: warm and dry Psychological: alert and cooperative; normal mood and affect  LABS:  No results found for this or any previous visit (from the past 24 hour(s)).   ASSESSMENT & PLAN:  1. URI with cough and congestion   2. Encounter for screening for COVID-19     Meds ordered this encounter  Medications   azithromycin (ZITHROMAX) 250 MG tablet    Sig: Take 1 tablet (250 mg total) by mouth daily. Take first 2 tablets together, then 1 every day until finished.    Dispense:  6 tablet    Refill:  0   cetirizine (ZYRTEC ALLERGY) 10 MG tablet    Sig: Take 1 tablet (10 mg total) by mouth daily.    Dispense:  30 tablet    Refill:  0   benzonatate (TESSALON) 100 MG capsule    Sig: Take 1 capsule (100 mg total) by mouth every 8 (eight) hours.    Dispense:  30 capsule    Refill:  0   predniSONE (DELTASONE) 10 MG tablet    Sig: Take 2 tablets (20 mg total) by mouth daily.    Dispense:  15 tablet    Refill:  0    Discharge instructions   COVID testing ordered.  It will take between 2-7 days for test results.  Someone will contact you regarding abnormal results.    In the meantime: You should remain isolated in your home for 10 days from symptom onset AND greater than 72 hours after symptoms resolution (absence of fever without the use of fever-reducing medication and improvement in respiratory symptoms), whichever is longer Get plenty  of rest and push fluids Tessalon Perles prescribed for cough Zyrtec for nasal congestion, runny nose, and/or sore throat Low-dose prednisone was prescribed Azithromycin was prescribed Use medications daily for symptom relief Use OTC medications like ibuprofen or tylenol as needed fever or pain Call or go to the ED if you have any new or worsening symptoms such as fever, worsening cough, shortness of breath, chest tightness, chest pain, turning blue, changes in mental status, etc...   Reviewed  expectations re: course of current medical issues. Questions answered. Outlined signs and symptoms indicating need for more acute intervention. Patient verbalized understanding. After Visit Summary given.      Note: This document was prepared using Dragon voice recognition software and may include unintentional dictation errors.    Emerson Monte, FNP 10/26/19 [phone]

## 2019-10-26 NOTE — Discharge Instructions (Signed)
COVID testing ordered.  It will take between 2-7 days for test results.  Someone will contact you regarding abnormal results.    In the meantime: You should remain isolated in your home for 10 days from symptom onset AND greater than 72 hours after symptoms resolution (absence of fever without the use of fever-reducing medication and improvement in respiratory symptoms), whichever is longer Get plenty of rest and push fluids Tessalon Perles prescribed for cough Zyrtec for nasal congestion, runny nose, and/or sore throat Low-dose prednisone was prescribed Azithromycin was prescribed Use medications daily for symptom relief Use OTC medications like ibuprofen or tylenol as needed fever or pain Call or go to the ED if you have any new or worsening symptoms such as fever, worsening cough, shortness of breath, chest tightness, chest pain, turning blue, changes in mental status, etc..Marland Kitchen

## 2019-10-26 NOTE — ED Triage Notes (Signed)
Pt began having nasal congestion on tuesday, mucous has turned green today

## 2019-10-29 LAB — NOVEL CORONAVIRUS, NAA: SARS-CoV-2, NAA: NOT DETECTED

## 2019-12-10 ENCOUNTER — Telehealth (HOSPITAL_COMMUNITY): Payer: 59 | Admitting: Psychiatry

## 2019-12-11 ENCOUNTER — Other Ambulatory Visit: Payer: Self-pay

## 2019-12-11 ENCOUNTER — Telehealth (INDEPENDENT_AMBULATORY_CARE_PROVIDER_SITE_OTHER): Payer: 59 | Admitting: Physician Assistant

## 2019-12-11 DIAGNOSIS — F419 Anxiety disorder, unspecified: Secondary | ICD-10-CM | POA: Diagnosis not present

## 2019-12-11 DIAGNOSIS — F3342 Major depressive disorder, recurrent, in full remission: Secondary | ICD-10-CM

## 2019-12-11 DIAGNOSIS — F3281 Premenstrual dysphoric disorder: Secondary | ICD-10-CM | POA: Diagnosis not present

## 2019-12-11 MED ORDER — FLUOXETINE HCL 20 MG PO CAPS: 20.0000 mg | ORAL_CAPSULE | Freq: Every day | ORAL | 1 refills | Status: DC

## 2019-12-11 MED ORDER — CLONAZEPAM 0.5 MG PO TABS: 0.5000 mg | ORAL_TABLET | Freq: Every day | ORAL | 1 refills | Status: DC | PRN

## 2019-12-15 ENCOUNTER — Other Ambulatory Visit: Payer: Self-pay

## 2019-12-15 ENCOUNTER — Encounter: Payer: Self-pay | Admitting: General Surgery

## 2019-12-15 ENCOUNTER — Ambulatory Visit: Payer: 59 | Admitting: General Surgery

## 2019-12-15 VITALS — BP 120/83 | HR 67 | Temp 98.4°F | Resp 14 | Ht 65.5 in | Wt 219.0 lb

## 2019-12-15 DIAGNOSIS — D171 Benign lipomatous neoplasm of skin and subcutaneous tissue of trunk: Secondary | ICD-10-CM

## 2019-12-15 NOTE — H&P (Signed)
Megan Houston; [id number]; Jul 05, 1990   HPI Patient is a 29 year old white female who was referred to my care by Delman Cheadle for evaluation and treatment of a lipoma on her lower back.  Is been present for approximately 7 years but is now starting to cause her more discomfort when she is lying on her back or leaning over.  No drainage has been noted.  She has not noticed any recent growth. Past Medical History:  Diagnosis Date  . Anxiety   . Bipolar disorder ([place])   . Depression     History reviewed. No pertinent surgical history.  Family History  Problem Relation Age of Onset  . Hypertension Other   . Diabetes Other   . Hypertension Mother   . Bipolar disorder Mother   . Alcohol abuse Mother   . Drug abuse Mother   . Cancer Mother        cervical  . OCD Father   . Anxiety disorder Father   . Diabetes Father   . Hypertension Father   . Bipolar disorder Sister   . Drug abuse Sister   . Alcohol abuse Sister   . Liver disease Paternal Grandmother   . Diabetes Maternal Grandmother   . Bipolar disorder Maternal Grandmother   . Anxiety disorder Maternal Grandmother   . Schizophrenia Maternal Grandmother     Current Outpatient Medications on File Prior to Visit  Medication Sig Dispense Refill  . clonazePAM (KLONOPIN) 0.5 MG tablet Take 1 tablet (0.5 mg total) by mouth daily as needed for anxiety. 30 tablet 1  . FLUoxetine (PROZAC) 20 MG capsule Take 1 capsule (20 mg total) by mouth daily. 30 capsule 1  . TRI-SPRINTEC 0.18/0.215/0.25 MG-35 MCG tablet Take 1 tablet by mouth daily.   10   No current facility-administered medications on file prior to visit.    Allergies  Allergen Reactions  . Codeine Nausea And Vomiting  . Hydrocodone-Acetaminophen Nausea And Vomiting    Social History   Substance and Sexual Activity  Alcohol Use Not Currently  . Alcohol/week: 0.0 standard drinks   Comment: occasional    Social History   Tobacco Use  Smoking Status Former  Smoker  . Types: Cigarettes  . Quit date: 05/27/2014  . Years since quitting: 5.5  Smokeless Tobacco Never Used    Review of Systems  Constitutional: Negative.   HENT: Positive for sinus pain.   Eyes: Negative.   Respiratory: Negative.   Cardiovascular: Negative.   Gastrointestinal: Negative.   Genitourinary: Negative.   Musculoskeletal: Positive for back pain and joint pain.  Skin: Negative.   Neurological: Negative.   Endo/Heme/Allergies: Negative.   Psychiatric/Behavioral: Negative.     Objective   Vitals:   12/15/19 1420  BP: 120/83  Pulse: 67  Resp: 14  Temp: 98.4 F (36.9 C)  SpO2: 95%    Physical Exam Vitals reviewed.  Constitutional:      Appearance: Normal appearance. She is not ill-appearing.  HENT:     Head: Normocephalic and atraumatic.  Cardiovascular:     Rate and Rhythm: Normal rate and regular rhythm.     Heart sounds: Normal heart sounds. No murmur heard.  No friction rub. No gallop.   Pulmonary:     Effort: Pulmonary effort is normal. No respiratory distress.     Breath sounds: Normal breath sounds. No stridor. No wheezing, rhonchi or rales.  Skin:    General: Skin is warm and dry.  Neurological:     Mental Status: She  is alert and oriented to person, place, and time.   Back: 3 cm ovoid subcutaneous rubbery mass noted in the lower back, just to the right of the midline.  Assessment  Soft tissue mass, back, probable lipoma Plan   Patient is scheduled for excision of the lipoma, back on 01/01/2020.  The risks and benefits of the procedure including bleeding, infection, and recurrence were fully explained to the patient, who gave informed consent.

## 2019-12-15 NOTE — Patient Instructions (Signed)
Lipoma  A lipoma is a noncancerous (benign) tumor that is made up of fat cells. This is a very common type of soft-tissue growth. Lipomas are usually found under the skin (subcutaneous). They may occur in any tissue of the body that contains fat. Common areas for lipomas to appear include the back, arms, shoulders, buttocks, and thighs. Lipomas grow slowly, and they are usually painless. Most lipomas do not cause problems and do not require treatment. What are the causes? The cause of this condition is not known. What increases the risk? You are more likely to develop this condition if:  You are 40-60 years old.  You have a family history of lipomas. What are the signs or symptoms? A lipoma usually appears as a small, round bump under the skin. In most cases, the lump will:  Feel soft or rubbery.  Not cause pain or other symptoms. However, if a lipoma is located in an area where it pushes on nerves, it can become painful or cause other symptoms. How is this diagnosed? A lipoma can usually be diagnosed with a physical exam. You may also have tests to confirm the diagnosis and to rule out other conditions. Tests may include:  Imaging tests, such as a CT scan or an MRI.  Removal of a tissue sample to be looked at under a microscope (biopsy). How is this treated? Treatment for this condition depends on the size of the lipoma and whether it is causing any symptoms.  For small lipomas that are not causing problems, no treatment is needed.  If a lipoma is bigger or it causes problems, surgery may be done to remove the lipoma. Lipomas can also be removed to improve appearance. Most often, the procedure is done after applying a medicine that numbs the area (local anesthetic).  Liposuction may be done to reduce the size of the lipoma before it is removed through surgery, or it may be done to remove the lipoma. Lipomas are removed with this method in order to limit incision size and scarring. A  liposuction tube is inserted through a small incision into the lipoma, and the contents of the lipoma are removed through the tube with suction. Follow these instructions at home:  Watch your lipoma for any changes.  Keep all follow-up visits as told by your health care provider. This is important. Contact a health care provider if:  Your lipoma becomes larger or hard.  Your lipoma becomes painful, red, or increasingly swollen. These could be signs of infection or a more serious condition. Get help right away if:  You develop tingling or numbness in an area near the lipoma. This could indicate that the lipoma is causing nerve damage. Summary  A lipoma is a noncancerous tumor that is made up of fat cells.  Most lipomas do not cause problems and do not require treatment.  If a lipoma is bigger or it causes problems, surgery may be done to remove the lipoma.  Contact a health care provider if your lipoma becomes larger or hard, or if it becomes painful, red, or increasingly swollen. Pain, redness, and swelling could be signs of infection or a more serious condition. This information is not intended to replace advice given to you by your health care provider. Make sure you discuss any questions you have with your health care provider. Document Revised: 09/22/2018 Document Reviewed: 09/22/2018 Elsevier Patient Education  2020 [employer] Inc[employer]  

## 2019-12-15 NOTE — Progress Notes (Signed)
Megan Houston; [id number]; [birth year]   HPI Patient is a 29 year old white female who was referred to my care by Delman Cheadle for evaluation and treatment of a lipoma on her lower back.  Is been present for approximately 7 years but is now starting to cause her more discomfort when she is lying on her back or leaning over.  No drainage has been noted.  She has not noticed any recent growth. Past Medical History:  Diagnosis Date  . Anxiety   . Bipolar disorder ([place])   . Depression     History reviewed. No pertinent surgical history.  Family History  Problem Relation Age of Onset  . Hypertension Other   . Diabetes Other   . Hypertension Mother   . Bipolar disorder Mother   . Alcohol abuse Mother   . Drug abuse Mother   . Cancer Mother        cervical  . OCD Father   . Anxiety disorder Father   . Diabetes Father   . Hypertension Father   . Bipolar disorder Sister   . Drug abuse Sister   . Alcohol abuse Sister   . Liver disease Paternal Grandmother   . Diabetes Maternal Grandmother   . Bipolar disorder Maternal Grandmother   . Anxiety disorder Maternal Grandmother   . Schizophrenia Maternal Grandmother     Current Outpatient Medications on File Prior to Visit  Medication Sig Dispense Refill  . clonazePAM (KLONOPIN) 0.5 MG tablet Take 1 tablet (0.5 mg total) by mouth daily as needed for anxiety. 30 tablet 1  . FLUoxetine (PROZAC) 20 MG capsule Take 1 capsule (20 mg total) by mouth daily. 30 capsule 1  . TRI-SPRINTEC 0.18/0.215/0.25 MG-35 MCG tablet Take 1 tablet by mouth daily.   10   No current facility-administered medications on file prior to visit.    Allergies  Allergen Reactions  . Codeine Nausea And Vomiting  . Hydrocodone-Acetaminophen Nausea And Vomiting    Social History   Substance and Sexual Activity  Alcohol Use Not Currently  . Alcohol/week: 0.0 standard drinks   Comment: occasional    Social History   Tobacco Use  Smoking Status Former  Smoker  . Types: Cigarettes  . Quit date: 05/27/2014  . Years since quitting: 5.5  Smokeless Tobacco Never Used    Review of Systems  Constitutional: Negative.   HENT: Positive for sinus pain.   Eyes: Negative.   Respiratory: Negative.   Cardiovascular: Negative.   Gastrointestinal: Negative.   Genitourinary: Negative.   Musculoskeletal: Positive for back pain and joint pain.  Skin: Negative.   Neurological: Negative.   Endo/Heme/Allergies: Negative.   Psychiatric/Behavioral: Negative.     Objective   Vitals:   12/15/19 1420  BP: 120/83  Pulse: 67  Resp: 14  Temp: 98.4 F (36.9 C)  SpO2: 95%    Physical Exam Vitals reviewed.  Constitutional:      Appearance: Normal appearance. She is not ill-appearing.  HENT:     Head: Normocephalic and atraumatic.  Cardiovascular:     Rate and Rhythm: Normal rate and regular rhythm.     Heart sounds: Normal heart sounds. No murmur heard.  No friction rub. No gallop.   Pulmonary:     Effort: Pulmonary effort is normal. No respiratory distress.     Breath sounds: Normal breath sounds. No stridor. No wheezing, rhonchi or rales.  Skin:    General: Skin is warm and dry.  Neurological:     Mental Status: She  is alert and oriented to person, place, and time.   Back: 3 cm ovoid subcutaneous rubbery mass noted in the lower back, just to the right of the midline.  Assessment  Soft tissue mass, back, probable lipoma Plan   Patient is scheduled for excision of the lipoma, back on 01/01/2020.  The risks and benefits of the procedure including bleeding, infection, and recurrence were fully explained to the patient, who gave informed consent.

## 2019-12-16 ENCOUNTER — Encounter (HOSPITAL_COMMUNITY): Payer: Self-pay | Admitting: Physician Assistant

## 2019-12-16 NOTE — Progress Notes (Signed)
[facility] MD/PA/NP OP Progress Note  Virtual Visit via Telephone Note  I connected with Megan Houston on 12/16/19 at  2:00 PM EDT by telephone and verified that I am speaking with the correct person using two identifiers.  Location: Patient: Home  Provider: Office   I discussed the limitations, risks, security and privacy concerns of performing an evaluation and management service by telephone and the availability of in person appointments. I also discussed with the patient that there may be a patient responsible charge related to this service. The patient expressed understanding and agreed to proceed.  Follow Up Instructions:   I discussed the assessment and treatment plan with the patient. The patient was provided an opportunity to ask questions and all were answered. The patient agreed with the plan and demonstrated an understanding of the instructions.   The patient was advised to call back or seek an in-person evaluation if the symptoms worsen or if the condition fails to improve as anticipated.  I provided 30 minutes of non-face-to-face time during this encounter.   [name], PA   12/16/2019 8:51 PM AUBREY BLACKARD  MRN:  [id number]  Chief Complaint: Medication Refill  HPI:  Megan Houston. Frakes is a 29 year old, female with a past psychiatric history significant for anxiety and major depressive disorder who presents today via virtual telephone visit for follow up and medication refill. Patient is currently taking the following medications:  Prozac 20 mg 1 time daily Klonopin 0.5 mg 2 times daily as need for anxiety.  Patient denies issues with the current regimen of medications and does not require any dosage adjustments. Patient states she noticed a change in her mood after being off her Prozac for 2 weeks during her honeymoon. Since then, patient has been taking her Prozac regularly. Patient also reports that she has been experiencing some anxiety and mood swings. She  says that she gets so mad that she can't control herself, however, she endorses some days are difficult than others. Patient states that she needs a refill on her Klonopin. Patient has no other concerns.  Patient endorses suicide ideation, however, she reports the suicide ideation manifest as intrusive thoughts. Patient states that she gets so mad that she will hit herself as a form of self release. Although patient endorses suicide ideation, she has no active plan and can contract for safety. Patient denies homicide ideation. Patient further denies auditory and visual hallucinations. Patient reports having a fair appetite eating only 2 meals along with a protein shake and snack. Patient endorses good sleep and gets 8 - 9 hours of restful sleep. Patient consumes alcohol sparingly and denies tobacco use. Patient endorses illicit drug use in the form of marijuana.   Visit Diagnosis:    ICD-10-CM   1. PMDD (premenstrual dysphoric disorder)  F32.81 FLUoxetine (PROZAC) 20 MG capsule  2. Anxiety  F41.9 FLUoxetine (PROZAC) 20 MG capsule    clonazePAM (KLONOPIN) 0.5 MG tablet  3. MDD (major depressive disorder), recurrent, in full remission ([place])  F33.42 FLUoxetine (PROZAC) 20 MG capsule    clonazePAM (KLONOPIN) 0.5 MG tablet    Past Psychiatric History: See below  Past Medical History:  Past Medical History:  Diagnosis Date  . Anxiety   . Bipolar disorder ([place])   . Depression    No past surgical history on file.  Family Psychiatric History: See below  Family History:  Family History  Problem Relation Age of Onset  . Hypertension Other   . Diabetes Other   .  Hypertension Mother   . Bipolar disorder Mother   . Alcohol abuse Mother   . Drug abuse Mother   . Cancer Mother        cervical  . OCD Father   . Anxiety disorder Father   . Diabetes Father   . Hypertension Father   . Bipolar disorder Sister   . Drug abuse Sister   . Alcohol abuse Sister   . Liver disease Paternal  Grandmother   . Diabetes Maternal Grandmother   . Bipolar disorder Maternal Grandmother   . Anxiety disorder Maternal Grandmother   . Schizophrenia Maternal Grandmother     Social History:  Social History   Socioeconomic History  . Marital status: Single    Spouse name: Not on file  . Number of children: 0  . Years of education: Not on file  . Highest education level: Some college, no degree  Occupational History  . Not on file  Tobacco Use  . Smoking status: Former Smoker    Types: Cigarettes    Quit date: 05/27/2014    Years since quitting: 5.5  . Smokeless tobacco: Never Used  Substance and Sexual Activity  . Alcohol use: Not Currently    Alcohol/week: 0.0 standard drinks    Comment: occasional  . Drug use: No  . Sexual activity: Yes    Birth control/protection: Pill  Other Topics Concern  . Not on file  Social History Narrative  . Not on file   Social Determinants of Health   Financial Resource Strain:   . Difficulty of Paying Living Expenses: Not on file  Food Insecurity:   . Worried About Charity fundraiser in the Last Year: Not on file  . Ran Out of Food in the Last Year: Not on file  Transportation Needs:   . Lack of Transportation (Medical): Not on file  . Lack of Transportation (Non-Medical): Not on file  Physical Activity:   . Days of Exercise per Week: Not on file  . Minutes of Exercise per Session: Not on file  Stress:   . Feeling of Stress : Not on file  Social Connections:   . Frequency of Communication with Friends and Family: Not on file  . Frequency of Social Gatherings with Friends and Family: Not on file  . Attends Religious Services: Not on file  . Active Member of Clubs or Organizations: Not on file  . Attends Archivist Meetings: Not on file  . Marital Status: Not on file    Allergies:  Allergies  Allergen Reactions  . Codeine Nausea And Vomiting  . Hydrocodone-Acetaminophen Nausea And Vomiting    Metabolic Disorder  Labs: No results found for: HGBA1C, MPG No results found for: PROLACTIN No results found for: CHOL, TRIG, HDL, CHOLHDL, VLDL, LDLCALC No results found for: TSH  Therapeutic Level Labs: No results found for: LITHIUM No results found for: VALPROATE No components found for:  CBMZ  Current Medications: Current Outpatient Medications  Medication Sig Dispense Refill  . clonazePAM (KLONOPIN) 0.5 MG tablet Take 1 tablet (0.5 mg total) by mouth daily as needed for anxiety. 30 tablet 1  . FLUoxetine (PROZAC) 20 MG capsule Take 1 capsule (20 mg total) by mouth daily. 30 capsule 1  . TRI-SPRINTEC 0.18/0.215/0.25 MG-35 MCG tablet Take 1 tablet by mouth daily.   10   No current facility-administered medications for this visit.     Musculoskeletal: Strength & Muscle Tone: within normal limits Gait & Station: normal Patient leans: N/A  Psychiatric Specialty Exam: Review of Systems  There were no vitals taken for this visit.There is no height or weight on file to calculate BMI.  General Appearance: Well Groomed  Eye Contact:  Good  Speech:  Clear and Coherent and Normal Rate  Volume:  Normal  Mood:  Anxious  Affect:  Congruent  Thought Process:  Coherent and Goal Directed  Orientation:  Full (Time, Place, and Person)  Thought Content: Logical   Suicidal Thoughts:  Yes.  without intent/plan  Homicidal Thoughts:  No  Memory:  Immediate;   Good Recent;   Good Remote;   Good  Judgement:  Good  Insight:  Good  Psychomotor Activity:  Normal  Concentration:  Concentration: Good and Attention Span: Good  Recall:  Good  Fund of Knowledge: Good  Language: Good  Akathisia:  Negative  Handed:  Right  AIMS (if indicated): not done  Assets:  Communication Skills Desire for Improvement Financial Resources/Insurance Housing Physical Health Resilience Social Support Vocational/Educational  ADL's:  Intact  Cognition: WNL  Sleep:  Good   Screenings: PHQ2-9     Office Visit from  07/11/2017 in [facility]  PHQ-2 Total Score 0       Assessment and Plan:  Megan Houston is a 29 year old, female with a past psychiatric history significant for anxiety and major depressive disorder who presents today via virtual telephone visit for follow up and medication refill. Patient denies issues with the current regimen of medications and does not require any dosage adjustments at this time.   1. Anxiety  - FLUoxetine (PROZAC) 20 MG capsule; Take 1 capsule (20 mg total) by mouth daily.  Dispense: 30 capsule; Refill: 1  - ClonazePAM (KLONOPIN) 0.5 MG tablet; Take 1 tablet (0.5 mg total) by mouth daily as needed for anxiety.  Dispense: 30 tablet; Refill: 1  2. MDD (major depressive disorder), recurrent, in full remission ([place])  - FLUoxetine (PROZAC) 20 MG capsule; Take 1 capsule (20 mg total) by mouth daily.  Dispense: 30 capsule; Refill: [place], PA 12/16/2019, 8:51 PM

## 2019-12-29 NOTE — Patient Instructions (Signed)
Megan Houston  12/29/2019     @PREFPERIOPPHARMACY @   Your procedure is scheduled on  01/01/2020.  Report to [facility] at  [phone]  A.M.  Call this number if you have problems the morning of surgery:  [phone]   Remember:  Do not eat or drink after midnight.                       Take these medicines the morning of surgery with A SIP OF WATER  Clonazepam, prozac.    Do not wear jewelry, make-up or nail polish.  Do not wear lotions, powders, or perfumes, or deodorant. Please brush your teeth.  Do not shave 48 hours prior to surgery.  Men may shave face and neck.  Do not bring valuables to the hospital.  [facility] is not responsible for any belongings or valuables.  Contacts, dentures or bridgework may not be worn into surgery.  Leave your suitcase in the car.  After surgery it may be brought to your room.  For patients admitted to the hospital, discharge time will be determined by your treatment team.  Patients discharged the day of surgery will not be allowed to drive home.   Name and phone number of your driver:   family Special instructions:  DO NOT smoke the morning of your procedure.  Please read over the following fact sheets that you were given. Anesthesia Post-op Instructions and Care and Recovery After Surgery       Lipoma Removal, Care After This sheet gives you information about how to care for yourself after your procedure. Your health care provider may also give you more specific instructions. If you have problems or questions, contact your health care provider. What can I expect after the procedure? After the procedure, it is common to have:  Mild pain.  Swelling.  Bruising. Follow these instructions at home: Bathing   Do not take baths, swim, or use a hot tub until your health care provider approves. Ask your health care provider if you may take showers. You may only be allowed to take sponge baths.  Keep your bandage (dressing)  dry until your health care provider says it can be removed. Incision care   Follow instructions from your health care provider about how to take care of your incision. Make sure you: ? Wash your hands with soap and water for at least 20 seconds before and after you change your dressing. If soap and water are not available, use hand sanitizer. ? Change your dressing as told by your health care provider. ? Leave stitches (sutures), skin glue, or adhesive strips in place. These skin closures may need to stay in place for 2 weeks or longer. If adhesive strip edges start to loosen and curl up, you may trim the loose edges. Do not remove adhesive strips completely unless your health care provider tells you to do that.  Check your incision area every day for signs of infection. Check for: ? More redness, swelling, or pain. ? Fluid or blood. ? Warmth. ? Pus or a bad smell. Medicines  Take over-the-counter and prescription medicines only as told by your health care provider.  If you were prescribed an antibiotic medicine, use it as told by your health care provider. Do not stop using the antibiotic even if you start to feel better. General instructions   If you were given a sedative during the procedure, it can  affect you for several hours. Do not drive or operate machinery until your health care provider says that it is safe.  Do not use any products that contain nicotine or tobacco, such as cigarettes, e-cigarettes, and chewing tobacco. These can delay healing. If you need help quitting, ask your health care provider.  Return to your normal activities as told by your health care provider. Ask your health care provider what activities are safe for you.  Keep all follow-up visits as told by your health care provider. This is important. Contact a health care provider if:  You have more redness, swelling, or pain around your incision.  You have fluid or blood coming from your incision.  Your  incision feels warm to the touch.  You have pus or a bad smell coming from your incision.  You have pain that does not get better with medicine. Get help right away if:  You have chills or a fever.  You have severe pain. Summary  After the procedure, it is common to have mild pain, swelling, and bruising.  Follow instructions from your health care provider about how to take care of your incision.  Check your incision area every day for signs of infection.  Contact a health care provider if you have more redness, swelling, or pain around your incision. This information is not intended to replace advice given to you by your health care provider. Make sure you discuss any questions you have with your health care provider. Document Revised: 09/22/2018 Document Reviewed: 09/22/2018 Elsevier Patient Education  [place] After These instructions provide you with information about caring for yourself after your procedure. Your health care provider may also give you more specific instructions. Your treatment has been planned according to current medical practices, but problems sometimes occur. Call your health care provider if you have any problems or questions after your procedure. What can I expect after the procedure? After your procedure, you may:  Feel sleepy for several hours.  Feel clumsy and have poor balance for several hours.  Feel forgetful about what happened after the procedure.  Have poor judgment for several hours.  Feel nauseous or vomit.  Have a sore throat if you had a breathing tube during the procedure. Follow these instructions at home: For at least 24 hours after the procedure:      Have a responsible adult stay with you. It is important to have someone help care for you until you are awake and alert.  Rest as needed.  Do not: ? Participate in activities in which you could fall or become  injured. ? Drive. ? Use heavy machinery. ? Drink alcohol. ? Take sleeping pills or medicines that cause drowsiness. ? Make important decisions or sign legal documents. ? Take care of children on your own. Eating and drinking  Follow the diet that is recommended by your health care provider.  If you vomit, drink water, juice, or soup when you can drink without vomiting.  Make sure you have little or no nausea before eating solid foods. General instructions  Take over-the-counter and prescription medicines only as told by your health care provider.  If you have sleep apnea, surgery and certain medicines can increase your risk for breathing problems. Follow instructions from your health care provider about wearing your sleep device: ? Anytime you are sleeping, including during daytime naps. ? While taking prescription pain medicines, sleeping medicines, or medicines that make you drowsy.  If you  smoke, do not smoke without supervision.  Keep all follow-up visits as told by your health care provider. This is important. Contact a health care provider if:  You keep feeling nauseous or you keep vomiting.  You feel light-headed.  You develop a rash.  You have a fever. Get help right away if:  You have trouble breathing. Summary  For several hours after your procedure, you may feel sleepy and have poor judgment.  Have a responsible adult stay with you for at least 24 hours or until you are awake and alert. This information is not intended to replace advice given to you by your health care provider. Make sure you discuss any questions you have with your health care provider. Document Revised: 05/06/2017 Document Reviewed: 05/29/2015 Elsevier Patient Education  [place]. How to Use Chlorhexidine for Bathing Chlorhexidine gluconate (CHG) is a germ-killing (antiseptic) solution that is used to clean the skin. It can get rid of the bacteria that normally live on the skin and  can keep them away for about 24 hours. To clean your skin with CHG, you may be given:  A CHG solution to use in the shower or as part of a sponge bath.  A prepackaged cloth that contains CHG. Cleaning your skin with CHG may help lower the risk for infection:  While you are staying in the intensive care unit of the hospital.  If you have a vascular access, such as a central line, to provide short-term or long-term access to your veins.  If you have a catheter to drain urine from your bladder.  If you are on a ventilator. A ventilator is a machine that helps you breathe by moving air in and out of your lungs.  After surgery. What are the risks? Risks of using CHG include:  A skin reaction.  Hearing loss, if CHG gets in your ears.  Eye injury, if CHG gets in your eyes and is not rinsed out.  The CHG product catching fire. Make sure that you avoid smoking and flames after applying CHG to your skin. Do not use CHG:  If you have a chlorhexidine allergy or have previously reacted to chlorhexidine.  On babies younger than 53 months of age. How to use CHG solution  Use CHG only as told by your health care provider, and follow the instructions on the label.  Use the full amount of CHG as directed. Usually, this is one bottle. During a shower Follow these steps when using CHG solution during a shower (unless your health care provider gives you different instructions): 1. Start the shower. 2. Use your normal soap and shampoo to wash your face and hair. 3. Turn off the shower or move out of the shower stream. 4. Pour the CHG onto a clean washcloth. Do not use any type of brush or rough-edged sponge. 5. Starting at your neck, lather your body down to your toes. Make sure you follow these instructions: ? If you will be having surgery, pay special attention to the part of your body where you will be having surgery. Scrub this area for at least 1 minute. ? Do not use CHG on your head or  face. If the solution gets into your ears or eyes, rinse them well with water. ? Avoid your genital area. ? Avoid any areas of skin that have broken skin, cuts, or scrapes. ? Scrub your back and under your arms. Make sure to wash skin folds. 6. Let the lather sit on  your skin for 1-2 minutes or as long as told by your health care provider. 7. Thoroughly rinse your entire body in the shower. Make sure that all body creases and crevices are rinsed well. 8. Dry off with a clean towel. Do not put any substances on your body afterward--such as powder, lotion, or perfume--unless you are told to do so by your health care provider. Only use lotions that are recommended by the manufacturer. 9. Put on clean clothes or pajamas. 10. If it is the night before your surgery, sleep in clean sheets.  During a sponge bath Follow these steps when using CHG solution during a sponge bath (unless your health care provider gives you different instructions): 1. Use your normal soap and shampoo to wash your face and hair. 2. Pour the CHG onto a clean washcloth. 3. Starting at your neck, lather your body down to your toes. Make sure you follow these instructions: ? If you will be having surgery, pay special attention to the part of your body where you will be having surgery. Scrub this area for at least 1 minute. ? Do not use CHG on your head or face. If the solution gets into your ears or eyes, rinse them well with water. ? Avoid your genital area. ? Avoid any areas of skin that have broken skin, cuts, or scrapes. ? Scrub your back and under your arms. Make sure to wash skin folds. 4. Let the lather sit on your skin for 1-2 minutes or as long as told by your health care provider. 5. Using a different clean, wet washcloth, thoroughly rinse your entire body. Make sure that all body creases and crevices are rinsed well. 6. Dry off with a clean towel. Do not put any substances on your body afterward--such as powder,  lotion, or perfume--unless you are told to do so by your health care provider. Only use lotions that are recommended by the manufacturer. 7. Put on clean clothes or pajamas. 8. If it is the night before your surgery, sleep in clean sheets. How to use CHG prepackaged cloths  Only use CHG cloths as told by your health care provider, and follow the instructions on the label.  Use the CHG cloth on clean, dry skin.  Do not use the CHG cloth on your head or face unless your health care provider tells you to.  When washing with the CHG cloth: ? Avoid your genital area. ? Avoid any areas of skin that have broken skin, cuts, or scrapes. Before surgery Follow these steps when using a CHG cloth to clean before surgery (unless your health care provider gives you different instructions): 1. Using the CHG cloth, vigorously scrub the part of your body where you will be having surgery. Scrub using a back-and-forth motion for 3 minutes. The area on your body should be completely wet with CHG when you are done scrubbing. 2. Do not rinse. Discard the cloth and let the area air-dry. Do not put any substances on the area afterward, such as powder, lotion, or perfume. 3. Put on clean clothes or pajamas. 4. If it is the night before your surgery, sleep in clean sheets.  For general bathing Follow these steps when using CHG cloths for general bathing (unless your health care provider gives you different instructions). 1. Use a separate CHG cloth for each area of your body. Make sure you wash between any folds of skin and between your fingers and toes. Wash your body in the following order,  switching to a new cloth after each step: ? The front of your neck, shoulders, and chest. ? Both of your arms, under your arms, and your hands. ? Your stomach and groin area, avoiding the genitals. ? Your right leg and foot. ? Your left leg and foot. ? The back of your neck, your back, and your buttocks. 2. Do not rinse.  Discard the cloth and let the area air-dry. Do not put any substances on your body afterward--such as powder, lotion, or perfume--unless you are told to do so by your health care provider. Only use lotions that are recommended by the manufacturer. 3. Put on clean clothes or pajamas. Contact a health care provider if:  Your skin gets irritated after scrubbing.  You have questions about using your solution or cloth. Get help right away if:  Your eyes become very red or swollen.  Your eyes itch badly.  Your skin itches badly and is red or swollen.  Your hearing changes.  You have trouble seeing.  You have swelling or tingling in your mouth or throat.  You have trouble breathing.  You swallow any chlorhexidine. Summary  Chlorhexidine gluconate (CHG) is a germ-killing (antiseptic) solution that is used to clean the skin. Cleaning your skin with CHG may help to lower your risk for infection.  You may be given CHG to use for bathing. It may be in a bottle or in a prepackaged cloth to use on your skin. Carefully follow your health care provider's instructions and the instructions on the product label.  Do not use CHG if you have a chlorhexidine allergy.  Contact your health care provider if your skin gets irritated after scrubbing. This information is not intended to replace advice given to you by your health care provider. Make sure you discuss any questions you have with your health care provider. Document Revised: 04/24/2018 Document Reviewed: 01/03/2017 Elsevier Patient Education  [place].

## 2019-12-30 ENCOUNTER — Encounter (HOSPITAL_COMMUNITY)
Admission: RE | Admit: 2019-12-30 | Discharge: 2019-12-30 | Disposition: A | Discharge status: Home / Self Care | Payer: 59 | Source: Ambulatory Visit | Attending: General Surgery | Admitting: General Surgery

## 2019-12-30 ENCOUNTER — Other Ambulatory Visit: Payer: Self-pay

## 2019-12-30 ENCOUNTER — Other Ambulatory Visit (HOSPITAL_COMMUNITY)
Admission: RE | Admit: 2019-12-30 | Discharge: 2019-12-30 | Disposition: A | Discharge status: Home / Self Care | Payer: 59 | Source: Ambulatory Visit | Attending: General Surgery | Admitting: General Surgery

## 2019-12-30 ENCOUNTER — Encounter (HOSPITAL_COMMUNITY): Payer: Self-pay

## 2019-12-30 DIAGNOSIS — Z01812 Encounter for preprocedural laboratory examination: Secondary | ICD-10-CM | POA: Insufficient documentation

## 2019-12-30 DIAGNOSIS — Z20822 Contact with and (suspected) exposure to covid-19: Secondary | ICD-10-CM | POA: Diagnosis not present

## 2019-12-30 HISTORY — DX: Unspecified asthma, uncomplicated: J45.909

## 2019-12-30 LAB — PREGNANCY, URINE: Preg Test, Ur: NEGATIVE

## 2019-12-31 LAB — SARS CORONAVIRUS 2 (TAT 6-24 HRS): SARS Coronavirus 2: NEGATIVE

## 2020-01-01 ENCOUNTER — Other Ambulatory Visit: Payer: Self-pay

## 2020-01-01 ENCOUNTER — Ambulatory Visit (HOSPITAL_COMMUNITY): Payer: 59 | Admitting: Anesthesiology

## 2020-01-01 ENCOUNTER — Encounter (HOSPITAL_COMMUNITY)
Admission: RE | Disposition: A | Discharge status: Home / Self Care | Payer: Self-pay | Source: Home / Self Care | Attending: General Surgery

## 2020-01-01 ENCOUNTER — Ambulatory Visit (HOSPITAL_COMMUNITY)
Admission: RE | Admit: 2020-01-01 | Discharge: 2020-01-01 | Disposition: A | Discharge status: Home / Self Care | Payer: 59 | Attending: General Surgery | Admitting: General Surgery

## 2020-01-01 ENCOUNTER — Encounter (HOSPITAL_COMMUNITY): Payer: Self-pay | Admitting: General Surgery

## 2020-01-01 DIAGNOSIS — Z833 Family history of diabetes mellitus: Secondary | ICD-10-CM | POA: Diagnosis not present

## 2020-01-01 DIAGNOSIS — Z8049 Family history of malignant neoplasm of other genital organs: Secondary | ICD-10-CM | POA: Insufficient documentation

## 2020-01-01 DIAGNOSIS — J45909 Unspecified asthma, uncomplicated: Secondary | ICD-10-CM | POA: Diagnosis not present

## 2020-01-01 DIAGNOSIS — F319 Bipolar disorder, unspecified: Secondary | ICD-10-CM | POA: Diagnosis not present

## 2020-01-01 DIAGNOSIS — Z811 Family history of alcohol abuse and dependence: Secondary | ICD-10-CM | POA: Insufficient documentation

## 2020-01-01 DIAGNOSIS — F419 Anxiety disorder, unspecified: Secondary | ICD-10-CM | POA: Insufficient documentation

## 2020-01-01 DIAGNOSIS — Z885 Allergy status to narcotic agent status: Secondary | ICD-10-CM | POA: Insufficient documentation

## 2020-01-01 DIAGNOSIS — D171 Benign lipomatous neoplasm of skin and subcutaneous tissue of trunk: Secondary | ICD-10-CM

## 2020-01-01 DIAGNOSIS — Z87891 Personal history of nicotine dependence: Secondary | ICD-10-CM | POA: Diagnosis not present

## 2020-01-01 DIAGNOSIS — Z79899 Other long term (current) drug therapy: Secondary | ICD-10-CM | POA: Insufficient documentation

## 2020-01-01 DIAGNOSIS — Z818 Family history of other mental and behavioral disorders: Secondary | ICD-10-CM | POA: Insufficient documentation

## 2020-01-01 DIAGNOSIS — Z8249 Family history of ischemic heart disease and other diseases of the circulatory system: Secondary | ICD-10-CM | POA: Diagnosis not present

## 2020-01-01 DIAGNOSIS — Z8489 Family history of other specified conditions: Secondary | ICD-10-CM | POA: Insufficient documentation

## 2020-01-01 HISTORY — PX: LIPOMA EXCISION: SHX5283

## 2020-01-01 SURGERY — EXCISION LIPOMA
Anesthesia: General | Site: Back

## 2020-01-01 MED ORDER — LIDOCAINE HCL (CARDIAC) PF 100 MG/5ML IV SOSY
PREFILLED_SYRINGE | INTRAVENOUS | Status: DC | PRN
  Administered 2020-01-01: 100 mg via INTRAVENOUS

## 2020-01-01 MED ORDER — MEPERIDINE HCL 50 MG/ML IJ SOLN: 6.2500 mg | INTRAMUSCULAR | Status: DC | PRN

## 2020-01-01 MED ORDER — ONDANSETRON HCL 4 MG/2ML IJ SOLN
INTRAMUSCULAR | Status: DC | PRN
  Administered 2020-01-01: 4 mg via INTRAVENOUS

## 2020-01-01 MED ORDER — FENTANYL CITRATE (PF) 100 MCG/2ML IJ SOLN
INTRAMUSCULAR | Status: AC
  Filled 2020-01-01: qty 2

## 2020-01-01 MED ORDER — FENTANYL CITRATE (PF) 100 MCG/2ML IJ SOLN
INTRAMUSCULAR | Status: DC | PRN
  Administered 2020-01-01: 25 ug via INTRAVENOUS
  Administered 2020-01-01: 50 ug via INTRAVENOUS
  Administered 2020-01-01: 25 ug via INTRAVENOUS

## 2020-01-01 MED ORDER — EPHEDRINE SULFATE 50 MG/ML IJ SOLN
INTRAMUSCULAR | Status: DC | PRN
  Administered 2020-01-01: 10 mg via INTRAVENOUS

## 2020-01-01 MED ORDER — DEXAMETHASONE SODIUM PHOSPHATE 4 MG/ML IJ SOLN
INTRAMUSCULAR | Status: DC | PRN
  Administered 2020-01-01: 10 mg via INTRAVENOUS

## 2020-01-01 MED ORDER — HYDROMORPHONE HCL 1 MG/ML IJ SOLN
0.2500 mg | INTRAMUSCULAR | Status: DC | PRN
  Administered 2020-01-01 (×2): 0.5 mg via INTRAVENOUS
  Filled 2020-01-01 (×2): qty 0.5

## 2020-01-01 MED ORDER — BUPIVACAINE HCL (PF) 0.5 % IJ SOLN
INTRAMUSCULAR | Status: AC
  Filled 2020-01-01: qty 30

## 2020-01-01 MED ORDER — PROPOFOL 10 MG/ML IV BOLUS
INTRAVENOUS | Status: AC
  Filled 2020-01-01: qty 20

## 2020-01-01 MED ORDER — CHLORHEXIDINE GLUCONATE CLOTH 2 % EX PADS: 6.0000 | MEDICATED_PAD | Freq: Once | CUTANEOUS | Status: DC

## 2020-01-01 MED ORDER — 0.9 % SODIUM CHLORIDE (POUR BTL) OPTIME
TOPICAL | Status: DC | PRN
  Administered 2020-01-01: 1000 mL

## 2020-01-01 MED ORDER — KETOROLAC TROMETHAMINE 30 MG/ML IJ SOLN
30.0000 mg | Freq: Once | INTRAMUSCULAR | Status: AC
  Administered 2020-01-01: 30 mg via INTRAVENOUS
  Filled 2020-01-01: qty 1

## 2020-01-01 MED ORDER — MIDAZOLAM HCL 2 MG/2ML IJ SOLN
INTRAMUSCULAR | Status: AC
  Filled 2020-01-01: qty 2

## 2020-01-01 MED ORDER — BUPIVACAINE HCL (PF) 0.5 % IJ SOLN
INTRAMUSCULAR | Status: DC | PRN
  Administered 2020-01-01: 10 mL

## 2020-01-01 MED ORDER — LACTATED RINGERS IV SOLN: INTRAVENOUS | Status: DC | PRN

## 2020-01-01 MED ORDER — ORAL CARE MOUTH RINSE: 15.0000 mL | Freq: Once | OROMUCOSAL | Status: AC

## 2020-01-01 MED ORDER — MIDAZOLAM HCL 5 MG/5ML IJ SOLN
INTRAMUSCULAR | Status: DC | PRN
  Administered 2020-01-01: 2 mg via INTRAVENOUS

## 2020-01-01 MED ORDER — POVIDONE-IODINE 10 % EX OINT
TOPICAL_OINTMENT | CUTANEOUS | Status: AC
  Filled 2020-01-01: qty 1

## 2020-01-01 MED ORDER — KETOROLAC TROMETHAMINE 30 MG/ML IJ SOLN
INTRAMUSCULAR | Status: AC
  Filled 2020-01-01: qty 1

## 2020-01-01 MED ORDER — DEXAMETHASONE SODIUM PHOSPHATE 10 MG/ML IJ SOLN
INTRAMUSCULAR | Status: AC
  Filled 2020-01-01: qty 1

## 2020-01-01 MED ORDER — PROMETHAZINE HCL 25 MG/ML IJ SOLN: 6.2500 mg | INTRAMUSCULAR | Status: DC | PRN

## 2020-01-01 MED ORDER — LACTATED RINGERS IV SOLN: Freq: Once | INTRAVENOUS | Status: AC

## 2020-01-01 MED ORDER — PROPOFOL 10 MG/ML IV BOLUS
INTRAVENOUS | Status: DC | PRN
  Administered 2020-01-01: 40 mg via INTRAVENOUS
  Administered 2020-01-01: 200 mg via INTRAVENOUS

## 2020-01-01 MED ORDER — ONDANSETRON HCL 4 MG/2ML IJ SOLN
INTRAMUSCULAR | Status: AC
  Filled 2020-01-01: qty 2

## 2020-01-01 MED ORDER — CHLORHEXIDINE GLUCONATE 0.12 % MT SOLN
15.0000 mL | Freq: Once | OROMUCOSAL | Status: AC
  Administered 2020-01-01: 15 mL via OROMUCOSAL

## 2020-01-01 MED ORDER — TRAMADOL HCL 50 MG PO TABS
50.0000 mg | ORAL_TABLET | Freq: Four times a day (QID) | ORAL | 0 refills | Status: DC | PRN
Start: 2020-01-01 — End: 2020-04-01

## 2020-01-01 MED ORDER — LIDOCAINE 2% (20 MG/ML) 5 ML SYRINGE
INTRAMUSCULAR | Status: AC
  Filled 2020-01-01: qty 5

## 2020-01-01 SURGICAL SUPPLY — 37 items
ADH SKN CLS APL DERMABOND .7 (GAUZE/BANDAGES/DRESSINGS) ×1
CLOTH BEACON ORANGE TIMEOUT ST (SAFETY) ×2 IMPLANT
COVER LIGHT HANDLE STERIS (MISCELLANEOUS) ×4 IMPLANT
COVER WAND RF STERILE (DRAPES) ×2 IMPLANT
DECANTER SPIKE VIAL GLASS SM (MISCELLANEOUS) ×2 IMPLANT
DERMABOND ADVANCED (GAUZE/BANDAGES/DRESSINGS) ×1
DERMABOND ADVANCED .7 DNX12 (GAUZE/BANDAGES/DRESSINGS) IMPLANT
ELECT NDL TIP 2.8 STRL (NEEDLE) IMPLANT
ELECT NEEDLE TIP 2.8 STRL (NEEDLE) ×2 IMPLANT
ELECT REM PT RETURN 9FT ADLT (ELECTROSURGICAL) ×2
ELECTRODE REM PT RTRN 9FT ADLT (ELECTROSURGICAL) ×1 IMPLANT
GLOVE BIOGEL M 7.0 STRL (GLOVE) ×1 IMPLANT
GLOVE BIOGEL PI IND STRL 7.0 (GLOVE) ×2 IMPLANT
GLOVE BIOGEL PI IND STRL 7.5 (GLOVE) IMPLANT
GLOVE BIOGEL PI INDICATOR 7.0 (GLOVE) ×2
GLOVE BIOGEL PI INDICATOR 7.5 (GLOVE) ×1
GLOVE SURG SS PI 7.5 STRL IVOR (GLOVE) ×2 IMPLANT
GOWN STRL REUS W/ TWL XL LVL3 (GOWN DISPOSABLE) IMPLANT
GOWN STRL REUS W/TWL LRG LVL3 (GOWN DISPOSABLE) ×4 IMPLANT
GOWN STRL REUS W/TWL XL LVL3 (GOWN DISPOSABLE) ×2
KIT TURNOVER KIT A (KITS) ×2 IMPLANT
MANIFOLD NEPTUNE II (INSTRUMENTS) ×2 IMPLANT
NDL HYPO 25X1 1.5 SAFETY (NEEDLE) ×1 IMPLANT
NEEDLE HYPO 25X1 1.5 SAFETY (NEEDLE) ×2 IMPLANT
NS IRRIG 1000ML POUR BTL (IV SOLUTION) ×2 IMPLANT
PACK MINOR (CUSTOM PROCEDURE TRAY) ×1 IMPLANT
PAD ARMBOARD 7.5X6 YLW CONV (MISCELLANEOUS) ×4 IMPLANT
SET BASIN LINEN APH (SET/KITS/TRAYS/PACK) ×2 IMPLANT
SUT ETHILON 3 0 FSL (SUTURE) IMPLANT
SUT MNCRL AB 4-0 PS2 18 (SUTURE) ×2 IMPLANT
SUT PROLENE 4 0 PS 2 18 (SUTURE) IMPLANT
SUT VIC AB 2-0 CT1 27 (SUTURE) ×2
SUT VIC AB 2-0 CT1 TAPERPNT 27 (SUTURE) IMPLANT
SUT VIC AB 3-0 SH 27 (SUTURE) ×2
SUT VIC AB 3-0 SH 27X BRD (SUTURE) IMPLANT
SYR CONTROL 10ML LL (SYRINGE) ×2 IMPLANT
TOWEL OR 17X26 4PK STRL BLUE (TOWEL DISPOSABLE) ×1 IMPLANT

## 2020-01-01 NOTE — Anesthesia Procedure Notes (Signed)
Procedure Name: LMA Insertion Performed by: Bion Todorov L, CRNA Pre-anesthesia Checklist: Patient identified, Emergency Drugs available, Suction available, Patient being monitored and Timeout performed Patient Re-evaluated:Patient Re-evaluated prior to induction Oxygen Delivery Method: Circle system utilized Preoxygenation: Pre-oxygenation with 100% oxygen Induction Type: IV induction LMA: LMA inserted LMA Size: 4.0 Number of attempts: 1 Placement Confirmation: positive ETCO2,  CO2 detector and breath sounds checked- equal and bilateral Tube secured with: Tape Dental Injury: Teeth and Oropharynx as per pre-operative assessment        

## 2020-01-01 NOTE — Interval H&P Note (Signed)
History and Physical Interval Note:  01/01/2020 7:05 AM  Megan Houston  has presented today for surgery, with the diagnosis of 3 cm lipoma; back.  The various methods of treatment have been discussed with the patient and family. After consideration of risks, benefits and other options for treatment, the patient has consented to  Procedure(s): EXCISION LIPOMA; BACK; 3CM (N/A) as a surgical intervention.  The patient's history has been reviewed, patient examined, no change in status, stable for surgery.  I have reviewed the patient's chart and labs.  Questions were answered to the patient's satisfaction.     Aviva Signs

## 2020-01-01 NOTE — Anesthesia Preprocedure Evaluation (Signed)
Anesthesia Evaluation  Patient identified by MRN, date of birth, ID band Patient awake    Reviewed: Allergy & Precautions, NPO status , Patient's Chart, lab work & pertinent test results  History of Anesthesia Complications Negative for: history of anesthetic complications  Airway Mallampati: I  TM Distance: <3 FB Neck ROM: Full    Dental  (+) Dental Advisory Given, Teeth Intact   Pulmonary asthma , former smoker,    Pulmonary exam normal breath sounds clear to auscultation       Cardiovascular Exercise Tolerance: Good Normal cardiovascular exam+ Valvular Problems/Murmurs  Rhythm:Regular Rate:Normal     Neuro/Psych PSYCHIATRIC DISORDERS Anxiety Depression Bipolar Disorder    GI/Hepatic negative GI ROS, Neg liver ROS,   Endo/Other  negative endocrine ROS  Renal/GU negative Renal ROS     Musculoskeletal   Abdominal   Peds  Hematology negative hematology ROS (+)   Anesthesia Other Findings   Reproductive/Obstetrics                            Anesthesia Physical Anesthesia Plan  ASA: II  Anesthesia Plan: General   Post-op Pain Management:    Induction: Intravenous  PONV Risk Score and Plan: 4 or greater and Ondansetron, Dexamethasone and Midazolam  Airway Management Planned: LMA  Additional Equipment:   Intra-op Plan:   Post-operative Plan:   Informed Consent: I have reviewed the patients History and Physical, chart, labs and discussed the procedure including the risks, benefits and alternatives for the proposed anesthesia with the patient or authorized representative who has indicated his/her understanding and acceptance.     Dental advisory given  Plan Discussed with: CRNA and Surgeon  Anesthesia Plan Comments:        Anesthesia Quick Evaluation

## 2020-01-01 NOTE — Discharge Instructions (Signed)
Lipoma Removal, Care After This sheet gives you information about how to care for yourself after your procedure. Your health care provider may also give you more specific instructions. If you have problems or questions, contact your health care provider. What can I expect after the procedure? After the procedure, it is common to have:  Mild pain.  Swelling.  Bruising. Follow these instructions at home: Bathing   Do not take baths, swim, or use a hot tub until your health care provider approves. Ask your health care provider if you may take showers. You may only be allowed to take sponge baths.  Keep your bandage (dressing) dry until your health care provider says it can be removed. Incision care   Follow instructions from your health care provider about how to take care of your incision. Make sure you: ? Wash your hands with soap and water for at least 20 seconds before and after you change your dressing. If soap and water are not available, use hand sanitizer. ? Change your dressing as told by your health care provider. ? Leave stitches (sutures), skin glue, or adhesive strips in place. These skin closures may need to stay in place for 2 weeks or longer. If adhesive strip edges start to loosen and curl up, you may trim the loose edges. Do not remove adhesive strips completely unless your health care provider tells you to do that.  Check your incision area every day for signs of infection. Check for: ? More redness, swelling, or pain. ? Fluid or blood. ? Warmth. ? Pus or a bad smell. Medicines  Take over-the-counter and prescription medicines only as told by your health care provider.  If you were prescribed an antibiotic medicine, use it as told by your health care provider. Do not stop using the antibiotic even if you start to feel better. General instructions   If you were given a sedative during the procedure, it can affect you for several hours. Do not drive or  operate machinery until your health care provider says that it is safe.  Do not use any products that contain nicotine or tobacco, such as cigarettes, e-cigarettes, and chewing tobacco. These can delay healing. If you need help quitting, ask your health care provider.  Return to your normal activities as told by your health care provider. Ask your health care provider what activities are safe for you.  Keep all follow-up visits as told by your health care provider. This is important. Contact a health care provider if:  You have more redness, swelling, or pain around your incision.  You have fluid or blood coming from your incision.  Your incision feels warm to the touch.  You have pus or a bad smell coming from your incision.  You have pain that does not get better with medicine. Get help right away if:  You have chills or a fever.  You have severe pain. Summary  After the procedure, it is common to have mild pain, swelling, and bruising.  Follow instructions from your health care provider about how to take care of your incision.  Check your incision area every day for signs of infection.  Contact a health care provider if you have more redness, swelling, or pain around your incision. This information is not intended to replace advice given to you by your health care provider. Make sure you discuss any questions you have with your health care provider. Document Revised: 09/22/2018 Document Reviewed: 09/22/2018 Elsevier  Patient Education  2020 [place] Anesthesia, Adult, Care After This sheet gives you information about how to care for yourself after your procedure. Your health care provider may also give you more specific instructions. If you have problems or questions, contact your health care provider. What can I expect after the procedure? After the procedure, the following side effects are common:  Pain or discomfort at the IV  site.  Nausea.  Vomiting.  Sore throat.  Trouble concentrating.  Feeling cold or chills.  Weak or tired.  Sleepiness and fatigue.  Soreness and body aches. These side effects can affect parts of the body that were not involved in surgery. Follow these instructions at home:  For at least 24 hours after the procedure:  Have a responsible adult stay with you. It is important to have someone help care for you until you are awake and alert.  Rest as needed.  Do not: ? Participate in activities in which you could fall or become injured. ? Drive. ? Use heavy machinery. ? Drink alcohol. ? Take sleeping pills or medicines that cause drowsiness. ? Make important decisions or sign legal documents. ? Take care of children on your own. Eating and drinking  Follow any instructions from your health care provider about eating or drinking restrictions.  When you feel hungry, start by eating small amounts of foods that are soft and easy to digest (bland), such as toast. Gradually return to your regular diet.  Drink enough fluid to keep your urine pale yellow.  If you vomit, rehydrate by drinking water, juice, or clear broth. General instructions  If you have sleep apnea, surgery and certain medicines can increase your risk for breathing problems. Follow instructions from your health care provider about wearing your sleep device: ? Anytime you are sleeping, including during daytime naps. ? While taking prescription pain medicines, sleeping medicines, or medicines that make you drowsy.  Return to your normal activities as told by your health care provider. Ask your health care provider what activities are safe for you.  Take over-the-counter and prescription medicines only as told by your health care provider.  If you smoke, do not smoke without supervision.  Keep all follow-up visits as told by your health care provider. This is important. Contact a health care provider if:  You  have nausea or vomiting that does not get better with medicine.  You cannot eat or drink without vomiting.  You have pain that does not get better with medicine.  You are unable to pass urine.  You develop a skin rash.  You have a fever.  You have redness around your IV site that gets worse. Get help right away if:  You have difficulty breathing.  You have chest pain.  You have blood in your urine or stool, or you vomit blood. Summary  After the procedure, it is common to have a sore throat or nausea. It is also common to feel tired.  Have a responsible adult stay with you for the first 24 hours after general anesthesia. It is important to have someone help care for you until you are awake and alert.  When you feel hungry, start by eating small amounts of foods that are soft and easy to digest (bland), such as toast. Gradually return to your regular diet.  Drink enough fluid to keep your urine pale yellow.  Return to your normal activities as told by your health care provider. Ask your health care provider  what activities are safe for you. This information is not intended to replace advice given to you by your health care provider. Make sure you discuss any questions you have with your health care provider. Document Revised: 02/08/2017 Document Reviewed: 09/21/2016 Elsevier Patient Education  [place].

## 2020-01-01 NOTE — Transfer of Care (Signed)
Immediate Anesthesia Transfer of Care Note  Patient: Megan Houston  Procedure(s) Performed: EXCISION of LIPOMA from BACK (N/A Back)  Patient Location: PACU  Anesthesia Type:General  Level of Consciousness: awake, alert , oriented and patient cooperative  Airway & Oxygen Therapy: Patient Spontanous Breathing  Post-op Assessment: Report given to RN, Post -op Vital signs reviewed and stable and Patient moving all extremities  Post vital signs: Reviewed and stable  Last Vitals:  Vitals Value Taken Time  BP 136/80 01/01/20 0803  Temp    Pulse 87 01/01/20 0805  Resp 12 01/01/20 0805  SpO2 97 % 01/01/20 0805  Vitals shown include unvalidated device data.  Last Pain:  Vitals:   01/01/20 0639  TempSrc: Oral  PainSc: 0-No pain      Patients Stated Pain Goal: 5 (09/32/35 5732)  Complications: No complications documented.

## 2020-01-01 NOTE — Anesthesia Postprocedure Evaluation (Signed)
Anesthesia Post Note  Patient: Megan Houston  Procedure(s) Performed: EXCISION of LIPOMA from BACK (N/A Back)  Patient location during evaluation: PACU Anesthesia Type: General Level of consciousness: awake, oriented, awake and alert and patient cooperative Pain management: pain level controlled Vital Signs Assessment: post-procedure vital signs reviewed and stable Respiratory status: spontaneous breathing, respiratory function stable and nonlabored ventilation Cardiovascular status: blood pressure returned to baseline and stable Postop Assessment: no headache and no backache Anesthetic complications: no   No complications documented.   Last Vitals:  Vitals:   01/01/20 0639  BP: 120/76  Pulse: 85  Resp: 19  Temp: 36.7 C  SpO2: 99%    Last Pain:  Vitals:   01/01/20 0639  TempSrc: Oral  PainSc: 0-No pain                 Tacy Learn

## 2020-01-01 NOTE — Op Note (Signed)
Patient:  Megan Houston  DOB:  1990/04/30  MRN:  [id number]   Preop Diagnosis: Soft tissue mass, back  Postop Diagnosis: Same, lipoma  Procedure: Excision of lipoma, back, greater than 3 cm  Surgeon: Aviva Signs, MD  Anes: General  Indications: Patient is a 29 year old white female who presents with a symptomatic tender soft tissue mass in the right lower back.  It is just above the buttock along the midline.  The risks and benefits of the procedure including bleeding, infection, and recurrence of the mass were fully explained to the patient, who gave informed consent.  Procedure note: The patient was placed in the left lateral decubitus position with her right side up after general anesthesia was administered.  The lower back was prepped and draped using the usual sterile technique with DuraPrep.  Surgical site confirmation was performed.  A transverse incision was made over the previously marked area.  The dissection was taken down to the soft tissue.  A multilobulated lipoma was found.  It was not attached to the muscle.  It was excised without difficulty.  There was approximately 4 to 5 cm in its greatest diameter.  Any bleeding was controlled using Bovie electrocautery.  The subcutaneous layer was reapproximated using 2-0 Vicryl interrupted sutures.  The skin was closed using a 4-0 Monocryl subcuticular suture.  0.5% Sensorcaine was instilled into the surrounding wound.  The incision was covered with Dermabond.  All tape and needle counts were correct at the end of the procedure.  The patient was awakened and transferred to PACU in stable condition.  Complications: None  EBL: Minimal  Specimen: Lipoma, back

## 2020-01-04 ENCOUNTER — Encounter (HOSPITAL_COMMUNITY): Payer: Self-pay | Admitting: General Surgery

## 2020-01-04 LAB — SURGICAL PATHOLOGY

## 2020-01-08 ENCOUNTER — Telehealth (INDEPENDENT_AMBULATORY_CARE_PROVIDER_SITE_OTHER): Payer: 59 | Admitting: General Surgery

## 2020-01-08 DIAGNOSIS — Z09 Encounter for follow-up examination after completed treatment for conditions other than malignant neoplasm: Secondary | ICD-10-CM

## 2020-01-08 NOTE — Progress Notes (Addendum)
Subjective:     Megan Houston  Virtual telephone postoperative visit performed.  Patient was at home and I was in the office.  She states she is doing well from the surgery.  Her preoperative symptoms have resolved.  She is only taking ibuprofen for pain. Objective:    There were no vitals taken for this visit.  General:  alert, cooperative and no distress  Final pathology consistent with diagnosis.     Assessment:    Doing well postoperatively.    Plan:   Patient was instructed to call the office should she have any issues.  Follow-up as needed.  Total telephone time 2 minutes.  This visit is part of the postoperative global care, thus is not a billable charge.

## 2020-01-28 ENCOUNTER — Ambulatory Visit
Admission: EM | Admit: 2020-01-28 | Discharge: 2020-01-28 | Disposition: A | Discharge status: Home / Self Care | Payer: 59 | Attending: Emergency Medicine | Admitting: Emergency Medicine

## 2020-01-28 ENCOUNTER — Encounter: Payer: Self-pay | Admitting: Emergency Medicine

## 2020-01-28 ENCOUNTER — Other Ambulatory Visit: Payer: Self-pay

## 2020-01-28 ENCOUNTER — Encounter: Payer: Self-pay | Admitting: Adult Health

## 2020-01-28 DIAGNOSIS — J069 Acute upper respiratory infection, unspecified: Secondary | ICD-10-CM

## 2020-01-28 DIAGNOSIS — Z1152 Encounter for screening for COVID-19: Secondary | ICD-10-CM | POA: Diagnosis not present

## 2020-01-28 MED ORDER — PREDNISONE 10 MG PO TABS: 20.0000 mg | ORAL_TABLET | Freq: Every day | ORAL | 0 refills | Status: DC

## 2020-01-28 MED ORDER — CETIRIZINE HCL 10 MG PO TABS: 10.0000 mg | ORAL_TABLET | Freq: Every day | ORAL | 0 refills | Status: DC

## 2020-01-28 MED ORDER — AZITHROMYCIN 250 MG PO TABS: 250.0000 mg | ORAL_TABLET | Freq: Every day | ORAL | 0 refills | Status: DC

## 2020-01-28 MED ORDER — FLUTICASONE PROPIONATE 50 MCG/ACT NA SUSP: 1.0000 | Freq: Every day | NASAL | 0 refills | Status: DC

## 2020-01-28 NOTE — ED Triage Notes (Signed)
sinus congestion- stuffy nose headache

## 2020-01-28 NOTE — ED Provider Notes (Signed)
[facility]   [id number] 01/28/20 Arrival Time: 7062   Chief Complaint  Patient presents with  . URI     SUBJECTIVE: History from: patient.  Megan Houston is a 29 y.o. female who presents to the urgent care for complaint of sinus pressure, nasal congestion with green nasal drainage and headache for the past 1 week.  Denies sick exposure to COVID, flu or strep.  Denies recent travel.  Has tried OTC medication without relief.  Denies aggravating factor.  Denies previous symptoms in the past.   Denies fever, chills, fatigue, rhinorrhea, sore throat, SOB, wheezing, chest pain, nausea, changes in bowel or bladder habits.     ROS: As per HPI.  All other pertinent ROS negative.       Past Medical History:  Diagnosis Date  . Anxiety   . Asthma   . Bipolar disorder ([place])   . Depression    Past Surgical History:  Procedure Laterality Date  . LIPOMA EXCISION N/A 01/01/2020   Procedure: EXCISION of LIPOMA from BACK;  Surgeon: Aviva Signs, MD;  Location: [facility];  Service: General;  Laterality: N/A;   Allergies  Allergen Reactions  . Codeine Nausea And Vomiting  . Hydrocodone-Acetaminophen Nausea And Vomiting   No current facility-administered medications on file prior to encounter.   Current Outpatient Medications on File Prior to Encounter  Medication Sig Dispense Refill  . carboxymethylcellulose (REFRESH PLUS) 0.5 % SOLN Place 1 drop into both eyes at bedtime.    . clonazePAM (KLONOPIN) 0.5 MG tablet Take 1 tablet (0.5 mg total) by mouth daily as needed for anxiety. 30 tablet 1  . FLUoxetine (PROZAC) 20 MG capsule Take 1 capsule (20 mg total) by mouth daily. 30 capsule 1  . Prenatal Vit-Fe Fumarate-FA (PRENATAL MULTIVITAMIN) TABS tablet Take 1 tablet by mouth daily at 12 noon.    . traMADol (ULTRAM) 50 MG tablet Take 1 tablet (50 mg total) by mouth every 6 (six) hours as needed for severe pain. 25 tablet 0   Social History   Socioeconomic History  . Marital  status: Married    Spouse name: Not on file  . Number of children: 0  . Years of education: Not on file  . Highest education level: Some college, no degree  Occupational History  . Not on file  Tobacco Use  . Smoking status: Former Smoker    Packs/day: 0.25    Years: 4.00    Pack years: 1.00    Types: Cigarettes    Quit date: 05/27/2014    Years since quitting: 5.6  . Smokeless tobacco: Never Used  Vaping Use  . Vaping Use: Never used  Substance and Sexual Activity  . Alcohol use: Not Currently    Alcohol/week: 0.0 standard drinks    Comment: occasional  . Drug use: No  . Sexual activity: Yes    Birth control/protection: Pill  Other Topics Concern  . Not on file  Social History Narrative  . Not on file   Social Determinants of Health   Financial Resource Strain: Not on file  Food Insecurity: Not on file  Transportation Needs: Not on file  Physical Activity: Not on file  Stress: Not on file  Social Connections: Not on file  Intimate Partner Violence: Not on file   Family History  Problem Relation Age of Onset  . Hypertension Other   . Diabetes Other   . Hypertension Mother   . Bipolar disorder Mother   . Alcohol abuse Mother   .  Drug abuse Mother   . Cancer Mother        cervical  . OCD Father   . Anxiety disorder Father   . Diabetes Father   . Hypertension Father   . Bipolar disorder Sister   . Drug abuse Sister   . Alcohol abuse Sister   . Liver disease Paternal Grandmother   . Diabetes Maternal Grandmother   . Bipolar disorder Maternal Grandmother   . Anxiety disorder Maternal Grandmother   . Schizophrenia Maternal Grandmother     OBJECTIVE:  Vitals:   01/28/20 0946  BP: 133/83  Pulse: 71  Resp: 16  Temp: 98.2 F (36.8 C)  SpO2: 98%     General appearance: alert; appears fatigued, but nontoxic; speaking in full sentences and tolerating own secretions HEENT: NCAT; Ears: EACs clear, TMs pearly gray; Eyes: PERRL.  EOM grossly intact. Sinuses:  nontender; Nose: nares patent without rhinorrhea, Throat: oropharynx clear, tonsils non erythematous or enlarged, uvula midline  Neck: supple without LAD Lungs: unlabored respirations, symmetrical air entry; cough: absent; no respiratory distress; CTAB Heart: regular rate and rhythm.  Radial pulses 2+ symmetrical bilaterally Skin: warm and dry Psychological: alert and cooperative; normal mood and affect  LABS:  No results found for this or any previous visit (from the past 24 hour(s)).   ASSESSMENT & PLAN:  1. Acute URI   2. Encounter for screening for COVID-19     Meds ordered this encounter  Medications  . cetirizine (ZYRTEC ALLERGY) 10 MG tablet    Sig: Take 1 tablet (10 mg total) by mouth daily.    Dispense:  30 tablet    Refill:  0  . fluticasone (FLONASE) 50 MCG/ACT nasal spray    Sig: Place 1 spray into both nostrils daily for 14 days.    Dispense:  16 g    Refill:  0  . azithromycin (ZITHROMAX) 250 MG tablet    Sig: Take 1 tablet (250 mg total) by mouth daily. Take first 2 tablets together, then 1 every day until finished.    Dispense:  6 tablet    Refill:  0  . predniSONE (DELTASONE) 10 MG tablet    Sig: Take 2 tablets (20 mg total) by mouth daily.    Dispense:  15 tablet    Refill:  0   Discharge Instruictions   COVID testing ordered.  It will take between 2 -7 days for test results.  Someone will contact you regarding abnormal results.    In the meantime: You should remain isolated in your home for 10 days from symptom onset AND greater than 72 hours after symptoms resolution (absence of fever without the use of fever-reducing medication and improvement in respiratory symptoms), whichever is longer Get plenty of rest and push fluids Zyrtec for nasal congestion, runny nose, and/or sore throat Flonase for nasal congestion and runny nose Azithromycin was prescribed Prednisone was prescribed Use medications daily for symptom relief Use OTC medications like  ibuprofen or tylenol as needed fever or pain Call or go to the ED if you have any new or worsening symptoms such as fever, worsening cough, shortness of breath, chest tightness, chest pain, turning blue, changes in mental status, etc...   Reviewed expectations re: course of current medical issues. Questions answered. Outlined signs and symptoms indicating need for more acute intervention. Patient verbalized understanding. After Visit Summary given.         Emerson Monte, FNP 01/28/20 1035

## 2020-01-28 NOTE — Discharge Instructions (Signed)
COVID testing ordered.  It will take between 2 -7 days for test results.  Someone will contact you regarding abnormal results.    In the meantime: You should remain isolated in your home for 10 days from symptom onset AND greater than 72 hours after symptoms resolution (absence of fever without the use of fever-reducing medication and improvement in respiratory symptoms), whichever is longer Get plenty of rest and push fluids Zyrtec for nasal congestion, runny nose, and/or sore throat Flonase for nasal congestion and runny nose Azithromycin was prescribed Prednisone was prescribed Use medications daily for symptom relief Use OTC medications like ibuprofen or tylenol as needed fever or pain Call or go to the ED if you have any new or worsening symptoms such as fever, worsening cough, shortness of breath, chest tightness, chest pain, turning blue, changes in mental status, etc..Marland Kitchen

## 2020-01-29 LAB — NOVEL CORONAVIRUS, NAA: SARS-CoV-2, NAA: NOT DETECTED

## 2020-01-29 LAB — SARS-COV-2, NAA 2 DAY TAT

## 2020-02-26 ENCOUNTER — Other Ambulatory Visit: Payer: Self-pay | Admitting: Adult Health

## 2020-03-02 ENCOUNTER — Telehealth (INDEPENDENT_AMBULATORY_CARE_PROVIDER_SITE_OTHER): Payer: 59 | Admitting: Psychiatry

## 2020-03-02 ENCOUNTER — Encounter (HOSPITAL_COMMUNITY): Payer: Self-pay | Admitting: Psychiatry

## 2020-03-02 ENCOUNTER — Other Ambulatory Visit: Payer: Self-pay

## 2020-03-02 DIAGNOSIS — F3281 Premenstrual dysphoric disorder: Secondary | ICD-10-CM

## 2020-03-02 DIAGNOSIS — F3342 Major depressive disorder, recurrent, in full remission: Secondary | ICD-10-CM | POA: Diagnosis not present

## 2020-03-02 DIAGNOSIS — F419 Anxiety disorder, unspecified: Secondary | ICD-10-CM

## 2020-03-02 MED ORDER — FLUOXETINE HCL 20 MG PO CAPS: 20.0000 mg | ORAL_CAPSULE | Freq: Every day | ORAL | 2 refills | Status: DC

## 2020-03-02 MED ORDER — CLONAZEPAM 0.5 MG PO TABS: 0.5000 mg | ORAL_TABLET | Freq: Every day | ORAL | 1 refills | Status: DC | PRN

## 2020-03-02 NOTE — Progress Notes (Signed)
BH MD/PA/NP OP Progress Note  Virtual Visit via Video Note  I connected with Megan Houston on 03/02/20 at  2:00 PM EST by a video enabled telemedicine application and verified that I am speaking with the correct person using two identifiers.  Location: Patient: Home Provider: Clinic   I discussed the limitations of evaluation and management by telemedicine and the availability of in person appointments. The patient expressed understanding and agreed to proceed.  I provided 15 minutes of non-face-to-face time during this encounter.     03/02/2020 2:00 PM Megan Houston  MRN:  [id number]  Chief Complaint: " I am doing great."  HPI: Patient reported that she had a great wedding ceremony back in September and everything worked out really well.  She stated that work-related stress is also improved.  She informed that when she saw another provider was coming from the writer back in October she was more anxious than usual because she had stopped taking her medications abruptly and September during her honeymoon.  She stated that ever since she resumed her medications as prescribed on a regular basis things have worked out well. She stated that everything is going really great and her married life and they are hoping to start a family sometime this year.  She is also interested in buying a house with her husband in the near future. She denies any other concerns or issues at this time and requested refills.  Writer discussed with her about the plan of discontinuing her medications once she makes a decision to start a family.  Patient verbalized understanding.  Visit Diagnosis:  No diagnosis found.  Past Psychiatric History: See below  Past Medical History:  Past Medical History:  Diagnosis Date  . Anxiety   . Asthma   . Bipolar disorder ([place])   . Depression     Past Surgical History:  Procedure Laterality Date  . LIPOMA EXCISION N/A 01/01/2020   Procedure: EXCISION of LIPOMA  from BACK;  Surgeon: Aviva Signs, MD;  Location: [facility];  Service: General;  Laterality: N/A;    Family Psychiatric History: See below  Family History:  Family History  Problem Relation Age of Onset  . Hypertension Other   . Diabetes Other   . Hypertension Mother   . Bipolar disorder Mother   . Alcohol abuse Mother   . Drug abuse Mother   . Cancer Mother        cervical  . OCD Father   . Anxiety disorder Father   . Diabetes Father   . Hypertension Father   . Bipolar disorder Sister   . Drug abuse Sister   . Alcohol abuse Sister   . Liver disease Paternal Grandmother   . Diabetes Maternal Grandmother   . Bipolar disorder Maternal Grandmother   . Anxiety disorder Maternal Grandmother   . Schizophrenia Maternal Grandmother     Social History:  Social History   Socioeconomic History  . Marital status: Married    Spouse name: Not on file  . Number of children: 0  . Years of education: Not on file  . Highest education level: Some college, no degree  Occupational History  . Not on file  Tobacco Use  . Smoking status: Former Smoker    Packs/day: 0.25    Years: 4.00    Pack years: 1.00    Types: Cigarettes    Quit date: 05/27/2014    Years since quitting: 5.7  . Smokeless tobacco: Never Used  Vaping Use  .  Vaping Use: Never used  Substance and Sexual Activity  . Alcohol use: Not Currently    Alcohol/week: 0.0 standard drinks    Comment: occasional  . Drug use: No  . Sexual activity: Yes    Birth control/protection: Pill  Other Topics Concern  . Not on file  Social History Narrative  . Not on file   Social Determinants of Health   Financial Resource Strain: Not on file  Food Insecurity: Not on file  Transportation Needs: Not on file  Physical Activity: Not on file  Stress: Not on file  Social Connections: Not on file    Allergies:  Allergies  Allergen Reactions  . Codeine Nausea And Vomiting  . Hydrocodone-Acetaminophen Nausea And Vomiting     Metabolic Disorder Labs: No results found for: HGBA1C, MPG No results found for: PROLACTIN No results found for: CHOL, TRIG, HDL, CHOLHDL, VLDL, LDLCALC No results found for: TSH  Therapeutic Level Labs: No results found for: LITHIUM No results found for: VALPROATE No components found for:  CBMZ  Current Medications: Current Outpatient Medications  Medication Sig Dispense Refill  . azithromycin (ZITHROMAX) 250 MG tablet Take 1 tablet (250 mg total) by mouth daily. Take first 2 tablets together, then 1 every day until finished. 6 tablet 0  . carboxymethylcellulose (REFRESH PLUS) 0.5 % SOLN Place 1 drop into both eyes at bedtime.    . cetirizine (ZYRTEC ALLERGY) 10 MG tablet Take 1 tablet (10 mg total) by mouth daily. 30 tablet 0  . clonazePAM (KLONOPIN) 0.5 MG tablet Take 1 tablet (0.5 mg total) by mouth daily as needed for anxiety. 30 tablet 1  . FLUoxetine (PROZAC) 20 MG capsule Take 1 capsule (20 mg total) by mouth daily. 30 capsule 1  . fluticasone (FLONASE) 50 MCG/ACT nasal spray Place 1 spray into both nostrils daily for 14 days. 16 g 0  . predniSONE (DELTASONE) 10 MG tablet Take 2 tablets (20 mg total) by mouth daily. 15 tablet 0  . Prenatal Vit-Fe Fumarate-FA (PRENATAL MULTIVITAMIN) TABS tablet Take 1 tablet by mouth daily at 12 noon.    . traMADol (ULTRAM) 50 MG tablet Take 1 tablet (50 mg total) by mouth every 6 (six) hours as needed for severe pain. 25 tablet 0   No current facility-administered medications for this visit.     Musculoskeletal: Strength & Muscle Tone: within normal limits Gait & Station: normal Patient leans: N/A  Psychiatric Specialty Exam: Review of Systems  There were no vitals taken for this visit.There is no height or weight on file to calculate BMI.  General Appearance: Well Groomed  Eye Contact:  Good  Speech:  Clear and Coherent and Normal Rate  Volume:  Normal  Mood:  Euthymic  Affect:  Congruent  Thought Process:  Coherent and Goal  Directed  Orientation:  Full (Time, Place, and Person)  Thought Content: Logical   Suicidal Thoughts:  No  Homicidal Thoughts:  No  Memory:  Immediate;   Good Recent;   Good Remote;   Good  Judgement:  Fair  Insight:  Fair  Psychomotor Activity:  Normal  Concentration:  Concentration: Good and Attention Span: Good  Recall:  Good  Fund of Knowledge: Good  Language: Good  Akathisia:  Negative  Handed:  Right  AIMS (if indicated): not done  Assets:  Communication Skills Desire for Improvement Financial Resources/Insurance Housing Physical Health Resilience Social Support Vocational/Educational  ADL's:  Intact  Cognition: WNL  Sleep:  Good   Screenings: PHQ2-9  [place] Office Visit from 07/11/2017 in [facility]  PHQ-2 Total Score 0       Assessment and Plan: Patient reported her depression and anxiety symptoms improved and she is doing better now.  She does not use clonazepam regularly.  1. MDD (major depressive disorder), recurrent, in full remission ([place])  - FLUoxetine (PROZAC) 20 MG capsule; Take 1 capsule (20 mg total) by mouth daily.  Dispense: 30 capsule; Refill: 2  2. PMDD (premenstrual dysphoric disorder)  - FLUoxetine (PROZAC) 20 MG capsule; Take 1 capsule (20 mg total) by mouth daily.  Dispense: 30 capsule; Refill: 2  3. Anxiety  - clonazePAM (KLONOPIN) 0.5 MG tablet; Take 1 tablet (0.5 mg total) by mouth daily as needed for anxiety.  Dispense: 30 tablet; Refill: 1 - FLUoxetine (PROZAC) 20 MG capsule; Take 1 capsule (20 mg total) by mouth daily.  Dispense: 30 capsule; Refill: 2  Continue same medication regimen. Follow up in 3 months.   Nevada Crane, MD 03/02/2020, 2:00 PM

## 2020-03-04 DIAGNOSIS — Z1152 Encounter for screening for COVID-19: Secondary | ICD-10-CM | POA: Diagnosis not present

## 2020-03-09 ENCOUNTER — Other Ambulatory Visit: Payer: Self-pay | Admitting: Adult Health

## 2020-03-10 ENCOUNTER — Other Ambulatory Visit: Payer: Self-pay

## 2020-03-10 DIAGNOSIS — Z20822 Contact with and (suspected) exposure to covid-19: Secondary | ICD-10-CM | POA: Diagnosis not present

## 2020-03-12 LAB — SARS-COV-2, NAA 2 DAY TAT

## 2020-03-12 LAB — NOVEL CORONAVIRUS, NAA: SARS-CoV-2, NAA: DETECTED — AB

## 2020-03-14 ENCOUNTER — Telehealth: Payer: Self-pay | Admitting: Oncology

## 2020-03-14 ENCOUNTER — Encounter: Payer: Self-pay | Admitting: Oncology

## 2020-03-14 NOTE — Telephone Encounter (Signed)
Called to discuss with patient about Covid symptoms and the use of a monoclonal antibody infusion for those with mild to moderate Covid symptoms and at a high risk of hospitalization.   Pt is qualified for the monoclonal antibody infusion, but due to medication shortages we cannot offer the pt the infusion at this time. This decision was based on clinical judgement as well as the the NIH Covid 19 treatment guidelines for treatment prioritization and equitable access. Symptoms tier reviewed as well as criteria for ending isolation. Preventative practices reviewed. Patient verbalized understanding.  Jacquelin Hawking, NP  03/14/2020 12:34 PM

## 2020-04-01 ENCOUNTER — Encounter: Payer: Self-pay | Admitting: Adult Health

## 2020-04-01 ENCOUNTER — Ambulatory Visit (INDEPENDENT_AMBULATORY_CARE_PROVIDER_SITE_OTHER): Payer: BC Managed Care – PPO | Admitting: Adult Health

## 2020-04-01 ENCOUNTER — Other Ambulatory Visit: Payer: Self-pay

## 2020-04-01 ENCOUNTER — Other Ambulatory Visit (HOSPITAL_COMMUNITY)
Admission: RE | Admit: 2020-04-01 | Discharge: 2020-04-01 | Disposition: A | Discharge status: Home / Self Care | Payer: BC Managed Care – PPO | Source: Ambulatory Visit | Attending: Adult Health | Admitting: Adult Health

## 2020-04-01 VITALS — BP 124/89 | HR 79 | Ht 65.0 in | Wt 230.5 lb

## 2020-04-01 DIAGNOSIS — Z01419 Encounter for gynecological examination (general) (routine) without abnormal findings: Secondary | ICD-10-CM | POA: Insufficient documentation

## 2020-04-01 DIAGNOSIS — N926 Irregular menstruation, unspecified: Secondary | ICD-10-CM

## 2020-04-01 DIAGNOSIS — Z319 Encounter for procreative management, unspecified: Secondary | ICD-10-CM

## 2020-04-01 DIAGNOSIS — Z3202 Encounter for pregnancy test, result negative: Secondary | ICD-10-CM | POA: Diagnosis not present

## 2020-04-01 LAB — POCT URINE PREGNANCY: Preg Test, Ur: NEGATIVE

## 2020-04-01 NOTE — Progress Notes (Signed)
Patient ID: Megan Houston, female   DOB: 08-02-1990, 30 y.o.   MRN: [id number] History of Present Illness: Megan Houston is a 30 year old white female, married in for a well woman gyn exam and pap. PCP is Delman Cheadle PA   Current Medications, Allergies, Past Medical History, Past Surgical History, Family History and Social History were reviewed in [employer] record.     Review of Systems: Patient denies any hearing loss, fatigue, blurred vision, shortness of breath, chest pain, abdominal pain, problems with bowel movements, urination, or intercourse. No joint pain or mood swings. Has more headaches since COVID in January  Periods irregular but none since COVID   Physical Exam:BP 124/89 (BP Location: Left Arm, Patient Position: Sitting, Cuff Size: Large)   Pulse 79   Ht 5\' 5"  (1.651 m)   Wt 230 lb 8 oz (104.6 kg)   LMP 02/07/2020   BMI 38.36 kg/m UPT is negative. General:  Well developed, well nourished, no acute distress Skin:  Warm and dry Neck:  Midline trachea, normal thyroid, good ROM, no lymphadenopathy Lungs; Clear to auscultation bilaterally Breast:  No dominant palpable mass, retraction, or nipple discharge Cardiovascular: Regular rate and rhythm Abdomen:  Soft, non tender, no hepatosplenomegaly Pelvic:  External genitalia is normal in appearance, no lesions.  The vagina is normal in appearance. Urethra has no lesions or masses. The cervix is smooth, pap with HRHPV genotyping performed.   Uterus is felt to be normal size, shape, and contour.  No adnexal masses or tenderness noted.Bladder is non tender, no masses felt. Extremities/musculoskeletal:  No swelling or varicosities noted, no clubbing or cyanosis Psych:  No mood changes, alert and cooperative,seems happy AA is 3 Fall risk is low PHQ 9 score is 8, is on Prozac and sees Dr [facility] GAD 7 score is 7  Upstream - 04/01/20 1042      Pregnancy Intention Screening   Does the  patient want to become pregnant in the next year? Yes    Does the patient's partner want to become pregnant in the next year? Yes    Would the patient like to discuss contraceptive options today? No      Contraception Wrap Up   Current Method Pregnant/Seeking Pregnancy    End Method Pregnant/Seeking Pregnancy    Contraception Counseling Provided No           Impression and Plan: 1. Encounter for gynecological examination with Papanicolaou smear of cervix Pap sent Physical in 1 year Pap in 3 if normal  2. Pregnancy examination or test, negative result   3. Irregular periods Call me when period starts  4. Patient desires pregnancy Take PNV Discussed timing of sex

## 2020-04-06 LAB — CYTOLOGY - PAP
Adequacy: ABSENT
Comment: NEGATIVE
Diagnosis: NEGATIVE
High risk HPV: NEGATIVE

## 2020-04-18 ENCOUNTER — Other Ambulatory Visit (HOSPITAL_COMMUNITY): Payer: Self-pay | Admitting: Psychiatry

## 2020-04-18 DIAGNOSIS — F419 Anxiety disorder, unspecified: Secondary | ICD-10-CM

## 2020-04-18 NOTE — Telephone Encounter (Signed)
Rx sent 

## 2020-05-16 ENCOUNTER — Telehealth: Payer: Self-pay | Admitting: Adult Health

## 2020-05-16 NOTE — Telephone Encounter (Signed)
Left message to come in for UPT and if negative will rx provera to try to get withdrawal bleed.

## 2020-05-16 NOTE — Telephone Encounter (Signed)
Patient wanted to let Megan Houston know that she hasn't started her period yet and stated that she was told that if it hadn't started by the end of the month a medication is supposed to given. Clinical staff will follow up with patient.

## 2020-05-18 ENCOUNTER — Ambulatory Visit (INDEPENDENT_AMBULATORY_CARE_PROVIDER_SITE_OTHER): Payer: BC Managed Care – PPO

## 2020-05-18 ENCOUNTER — Other Ambulatory Visit: Payer: Self-pay | Admitting: Adult Health

## 2020-05-18 ENCOUNTER — Other Ambulatory Visit: Payer: Self-pay

## 2020-05-18 DIAGNOSIS — N926 Irregular menstruation, unspecified: Secondary | ICD-10-CM | POA: Diagnosis not present

## 2020-05-18 LAB — POCT URINE PREGNANCY: Preg Test, Ur: NEGATIVE

## 2020-05-18 MED ORDER — MEDROXYPROGESTERONE ACETATE 10 MG PO TABS: ORAL_TABLET | ORAL | 0 refills | Status: DC

## 2020-05-18 NOTE — Progress Notes (Signed)
   NURSE VISIT- PREGNANCY CONFIRMATION   SUBJECTIVE:  Megan Houston is a 29 y.o. G0P0000 female at Unknown by certain LMP of No LMP recorded. (Menstrual status: Irregular Periods). Here for pregnancy confirmation.  Home pregnancy test: negative  She reports no complaints.  She is taking prenatal vitamins.    OBJECTIVE:  There were no vitals taken for this visit.  Appears well, in no apparent distress OB History  Gravida Para Term Preterm AB Living  0 0 0 0 0 0  SAB IAB Ectopic Multiple Live Births  0 0 0 0 0    Results for orders placed or performed in visit on 05/18/20 (from the past 24 hour(s))  POCT urine pregnancy   Collection Time: 05/18/20  3:25 PM  Result Value Ref Range   Preg Test, Ur Negative Negative    ASSESSMENT: Negative pregnancy test    PLAN: Prenatal vitamins: continue   Nausea medicines: not currently needed   OB packet given: No  Ziggy Reveles A Maison Kestenbaum  05/18/2020 3:27 PM

## 2020-05-18 NOTE — Progress Notes (Signed)
UPT is negative, will rx provera for 10 days to get withdrawal bleed, no period since December

## 2020-05-18 NOTE — Progress Notes (Signed)
Chart reviewed for nurse visit. Agree with plan of care. No period since December, negative UPT will rx provera to see if can get withdrawal bleed Estill Dooms, NP 05/18/2020 3:35 PM

## 2020-05-24 ENCOUNTER — Encounter (HOSPITAL_COMMUNITY): Payer: Self-pay | Admitting: Psychiatry

## 2020-05-24 ENCOUNTER — Telehealth (INDEPENDENT_AMBULATORY_CARE_PROVIDER_SITE_OTHER): Payer: BC Managed Care – PPO | Admitting: Psychiatry

## 2020-05-24 ENCOUNTER — Other Ambulatory Visit: Payer: Self-pay

## 2020-05-24 DIAGNOSIS — F3342 Major depressive disorder, recurrent, in full remission: Secondary | ICD-10-CM

## 2020-05-24 DIAGNOSIS — F3281 Premenstrual dysphoric disorder: Secondary | ICD-10-CM | POA: Diagnosis not present

## 2020-05-24 DIAGNOSIS — F419 Anxiety disorder, unspecified: Secondary | ICD-10-CM | POA: Diagnosis not present

## 2020-05-24 MED ORDER — FLUOXETINE HCL 20 MG PO CAPS: 20.0000 mg | ORAL_CAPSULE | Freq: Every day | ORAL | 1 refills | Status: DC

## 2020-05-24 MED ORDER — CLONAZEPAM 0.5 MG PO TABS: 0.5000 mg | ORAL_TABLET | Freq: Every day | ORAL | 0 refills | Status: DC | PRN

## 2020-05-24 NOTE — Progress Notes (Signed)
BH MD/PA/NP OP Progress Note  Virtual Visit via Video Note  I connected with Megan Houston on 05/24/20 at  3:00 PM EDT by a video enabled telemedicine application and verified that I am speaking with the correct person using two identifiers.  Location: Patient: Home Provider: Clinic   I discussed the limitations of evaluation and management by telemedicine and the availability of in person appointments. The patient expressed understanding and agreed to proceed.  I provided 16 minutes of non-face-to-face time during this encounter.    05/24/2020 3:12 PM MAKITA BLOW  MRN:  [id number]  Chief Complaint: " I am not doing too great."  HPI: Patient reported that some days have been comfortable for her lately.  She stated that this month is her grandmother's death anniversary and 2022-06-27 is always a difficult time for her.  She also reported that she has gained a lot of weight since last June ever since she stopped taking her birth control pill. She stated her periods have become very irregular and currently she is working with her OB/GYN regarding that.  She stated that she feels these hormonal changes are making her feel more down with low energy levels. She confirmed that she is not pregnant at present.  She stated that she wonders if she should go up on the dose of Prozac for the same time since it does not want to rely too much of the medicine.   Visit Diagnosis:    ICD-10-CM   1. MDD (major depressive disorder), recurrent, in full remission ([place])  F33.42 FLUoxetine (PROZAC) 20 MG capsule  2. PMDD (premenstrual dysphoric disorder)  F32.81 FLUoxetine (PROZAC) 20 MG capsule  3. Anxiety  F41.9 FLUoxetine (PROZAC) 20 MG capsule    clonazePAM (KLONOPIN) 0.5 MG tablet    Past Psychiatric History: See below  Past Medical History:  Past Medical History:  Diagnosis Date  . Anxiety   . Asthma   . Bipolar disorder ([place])   . Depression     Past Surgical History:  Procedure  Laterality Date  . LIPOMA EXCISION N/A 01/01/2020   Procedure: EXCISION of LIPOMA from BACK;  Surgeon: Aviva Signs, MD;  Location: [facility];  Service: General;  Laterality: N/A;    Family Psychiatric History: See below  Family History:  Family History  Problem Relation Age of Onset  . Hypertension Other   . Diabetes Other   . Hypertension Mother   . Bipolar disorder Mother   . Alcohol abuse Mother   . Drug abuse Mother   . Cancer Mother        cervical  . OCD Father   . Anxiety disorder Father   . Diabetes Father   . Hypertension Father   . Bipolar disorder Sister   . Drug abuse Sister   . Alcohol abuse Sister   . Liver disease Paternal Grandmother   . Diabetes Maternal Grandmother   . Bipolar disorder Maternal Grandmother   . Anxiety disorder Maternal Grandmother   . Schizophrenia Maternal Grandmother     Social History:  Social History   Socioeconomic History  . Marital status: Married    Spouse name: Not on file  . Number of children: 0  . Years of education: Not on file  . Highest education level: Some college, no degree  Occupational History  . Not on file  Tobacco Use  . Smoking status: Former Smoker    Packs/day: 0.25    Years: 4.00    Pack years: 1.00  Types: Cigarettes    Quit date: 05/27/2014    Years since quitting: 5.9  . Smokeless tobacco: Never Used  Vaping Use  . Vaping Use: Never used  Substance and Sexual Activity  . Alcohol use: Yes    Alcohol/week: 0.0 standard drinks    Comment: rarely  . Drug use: No  . Sexual activity: Yes    Birth control/protection: None  Other Topics Concern  . Not on file  Social History Narrative  . Not on file   Social Determinants of Health   Financial Resource Strain: Low Risk   . Difficulty of Paying Living Expenses: Not hard at all  Food Insecurity: No Food Insecurity  . Worried About Charity fundraiser in the Last Year: Never true  . Ran Out of Food in the Last Year: Never true  Transportation  Needs: No Transportation Needs  . Lack of Transportation (Medical): No  . Lack of Transportation (Non-Medical): No  Physical Activity: Insufficiently Active  . Days of Exercise per Week: 3 days  . Minutes of Exercise per Session: 30 min  Stress: Stress Concern Present  . Feeling of Stress : Rather much  Social Connections: Moderately Isolated  . Frequency of Communication with Friends and Family: More than three times a week  . Frequency of Social Gatherings with Friends and Family: Once a week  . Attends Religious Services: Never  . Active Member of Clubs or Organizations: No  . Attends Archivist Meetings: Never  . Marital Status: Married    Allergies:  Allergies  Allergen Reactions  . Codeine Nausea And Vomiting  . Hydrocodone-Acetaminophen Nausea And Vomiting    Metabolic Disorder Labs: No results found for: HGBA1C, MPG No results found for: PROLACTIN No results found for: CHOL, TRIG, HDL, CHOLHDL, VLDL, LDLCALC No results found for: TSH  Therapeutic Level Labs: No results found for: LITHIUM No results found for: VALPROATE No components found for:  CBMZ  Current Medications: Current Outpatient Medications  Medication Sig Dispense Refill  . carboxymethylcellulose (REFRESH PLUS) 0.5 % SOLN Place 1 drop into both eyes at bedtime.    . clonazePAM (KLONOPIN) 0.5 MG tablet Take 1 tablet (0.5 mg total) by mouth daily as needed. 30 tablet 0  . FLUoxetine (PROZAC) 20 MG capsule Take 1 capsule (20 mg total) by mouth daily. 30 capsule 1  . medroxyPROGESTERone (PROVERA) 10 MG tablet Take 1 daily for 10 days 10 tablet 0  . Prenatal Vit-Fe Fumarate-FA (PRENATAL MULTIVITAMIN) TABS tablet Take 1 tablet by mouth daily at 12 noon.     No current facility-administered medications for this visit.     Psychiatric Specialty Exam: Review of Systems  There were no vitals taken for this visit.There is no height or weight on file to calculate BMI.  General Appearance: Well  Groomed  Eye Contact:  Good  Speech:  Clear and Coherent and Normal Rate  Volume:  Normal  Mood:  Euthymic  Affect:  Congruent  Thought Process:  Coherent and Goal Directed  Orientation:  Full (Time, Place, and Person)  Thought Content: Logical   Suicidal Thoughts:  No  Homicidal Thoughts:  No  Memory:  Immediate;   Good Recent;   Good Remote;   Good  Judgement:  Fair  Insight:  Fair  Psychomotor Activity:  Normal  Concentration:  Concentration: Good and Attention Span: Good  Recall:  Good  Fund of Knowledge: Good  Language: Good  Akathisia:  Negative  Handed:  Right  AIMS (if  indicated): not done  Assets:  Communication Skills Desire for Improvement Financial Resources/Insurance Housing Physical Health Resilience Social Support Vocational/Educational  ADL's:  Intact  Cognition: WNL  Sleep:  Good   Screenings: AUDIT   Flowsheet Row Office Visit from 04/01/2020 in [facility]  Alcohol Use Disorder Identification Test Final Score (AUDIT) 3    GAD-7   Flowsheet Row Office Visit from 04/01/2020 in [facility]  Total GAD-7 Score 7    PHQ2-9   [place] Office Visit from 04/01/2020 in [place] Office Visit from 07/11/2017 in [facility]  PHQ-2 Total Score 2 0  PHQ-9 Total Score 8 --       Assessment and Plan: Patient reported having a few bad days in the past couple of months.  She is currently dealing with menstrual irregularities and is working with her OB/GYN regarding that.  She would like to continue the same regimen for now.  1. MDD (major depressive disorder), recurrent, in full remission ([place])  - FLUoxetine (PROZAC) 20 MG capsule; Take 1 capsule (20 mg total) by mouth daily.  Dispense: 30 capsule; Refill: 2  2. PMDD (premenstrual dysphoric disorder)  - FLUoxetine (PROZAC) 20 MG capsule; Take 1 capsule (20 mg total) by mouth daily.  Dispense: 30 capsule; Refill: 2  3. Anxiety  - clonazePAM (KLONOPIN) 0.5 MG  tablet; Take 1 tablet (0.5 mg total) by mouth daily as needed for anxiety.  Dispense: 30 tablet; Refill: 1 - FLUoxetine (PROZAC) 20 MG capsule; Take 1 capsule (20 mg total) by mouth daily.  Dispense: 30 capsule; Refill: 2  Continue same medication regimen. Follow up in 2 months. Writer informed patient that her care is being transferred to a different provider in a different clinic affiliated with [facility] psychiatry.  She verbalized understanding.  [place] Crane, MD 05/24/2020, 3:12 PM

## 2020-05-25 ENCOUNTER — Telehealth: Payer: Self-pay | Admitting: Adult Health

## 2020-05-25 NOTE — Telephone Encounter (Signed)
Patient wanted to let NP Laurann Montana know that she has started her menstrual cycle as of 05/25/2020. Clinical staff will follow up with patient.

## 2020-07-01 ENCOUNTER — Other Ambulatory Visit: Payer: Self-pay | Admitting: Adult Health

## 2020-07-01 DIAGNOSIS — Z319 Encounter for procreative management, unspecified: Secondary | ICD-10-CM

## 2020-07-04 ENCOUNTER — Ambulatory Visit (INDEPENDENT_AMBULATORY_CARE_PROVIDER_SITE_OTHER): Payer: BC Managed Care – PPO

## 2020-07-04 ENCOUNTER — Other Ambulatory Visit: Payer: Self-pay

## 2020-07-04 ENCOUNTER — Ambulatory Visit
Admission: EM | Admit: 2020-07-04 | Discharge: 2020-07-04 | Disposition: A | Discharge status: Home / Self Care | Payer: BC Managed Care – PPO | Attending: Emergency Medicine | Admitting: Emergency Medicine

## 2020-07-04 DIAGNOSIS — M79671 Pain in right foot: Secondary | ICD-10-CM

## 2020-07-04 DIAGNOSIS — S93601A Unspecified sprain of right foot, initial encounter: Secondary | ICD-10-CM | POA: Diagnosis not present

## 2020-07-04 DIAGNOSIS — W19XXXA Unspecified fall, initial encounter: Secondary | ICD-10-CM

## 2020-07-04 MED ORDER — IBUPROFEN 800 MG PO TABS: 800.0000 mg | ORAL_TABLET | Freq: Three times a day (TID) | ORAL | 0 refills | Status: DC

## 2020-07-04 NOTE — Discharge Instructions (Addendum)
Your xray is normal today which is reassuring.  Activity as tolerated.  Ice, elevation, ibuprofen as needed for pain.  Use of the provided shoe for comfort with activity.  Follow up with orthopedics or podiatry as needed if symptoms linger or persist greater than 4 weeks.

## 2020-07-04 NOTE — ED Provider Notes (Signed)
[facility]    CSN: [id number] Arrival date & time: 07/04/20  4098      History   Chief Complaint Chief Complaint  Patient presents with  . Foot Injury    HPI Megan Houston is a 30 y.o. female.   Megan Houston presents with complaints of pain to right foot, at the 3rd and 4th MTP joint primarily, s/p a fall yesterday. She was putting on pants and lost her balance, feeling like the foot inverted. She heard a pop. Pain since. Pain was worse this morning when she woke. Worse with weight bearing. Ibuprofen did help some. No previous foot or ankle injury. No numbness or tingling of the foot or toes. Bruising and swelling present.      Past Medical History:  Diagnosis Date  . Anxiety   . Asthma   . Bipolar disorder ([place])   . Depression     Patient Active Problem List   Diagnosis Date Noted  . Patient desires pregnancy 04/01/2020  . Irregular periods 04/01/2020  . Pregnancy examination or test, negative result 04/01/2020  . Encounter for gynecological examination with Papanicolaou smear of cervix 04/01/2020  . Lipoma of back   . MDD (major depressive disorder), recurrent, in full remission ([place]) 02/02/2019  . Anxiety 12/15/2018  . PMDD (premenstrual dysphoric disorder) 09/16/2018  . Bipolar 2 disorder ([place]) 07/23/2014  . Anxiety, generalized 07/23/2014  . CARDIAC MURMUR 01/04/2010  . ELEVATED BP READING WITHOUT DX HYPERTENSION 01/04/2010    Past Surgical History:  Procedure Laterality Date  . LIPOMA EXCISION N/A 01/01/2020   Procedure: EXCISION of LIPOMA from BACK;  Surgeon: Aviva Signs, MD;  Location: [facility];  Service: General;  Laterality: N/A;    OB History    Gravida  0   Para  0   Term  0   Preterm  0   AB  0   Living  0     SAB  0   IAB  0   Ectopic  0   Multiple  0   Live Births  0            Home Medications    Prior to Admission medications   Medication Sig Start Date End Date Taking? Authorizing Provider   ibuprofen (ADVIL) 800 MG tablet Take 1 tablet (800 mg total) by mouth 3 (three) times daily. 07/04/20  Yes Lurleen Soltero, Lanelle Bal B, NP  carboxymethylcellulose (REFRESH PLUS) 0.5 % SOLN Place 1 drop into both eyes at bedtime.    [provider]  clonazePAM (KLONOPIN) 0.5 MG tablet Take 1 tablet (0.5 mg total) by mouth daily as needed. 05/24/20   [place] Crane, MD  FLUoxetine (PROZAC) 20 MG capsule Take 1 capsule (20 mg total) by mouth daily. 05/24/20   [place] Crane, MD  medroxyPROGESTERone (PROVERA) 10 MG tablet Take 1 daily for 10 days 05/18/20   Estill Dooms, NP  Prenatal Vit-Fe Fumarate-FA (PRENATAL MULTIVITAMIN) TABS tablet Take 1 tablet by mouth daily at 12 noon.    [provider]    Family History Family History  Problem Relation Age of Onset  . Hypertension Other   . Diabetes Other   . Hypertension Mother   . Bipolar disorder Mother   . Alcohol abuse Mother   . Drug abuse Mother   . Cancer Mother        cervical  . OCD Father   . Anxiety disorder Father   . Diabetes Father   . Hypertension Father   .  Bipolar disorder Sister   . Drug abuse Sister   . Alcohol abuse Sister   . Liver disease Paternal Grandmother   . Diabetes Maternal Grandmother   . Bipolar disorder Maternal Grandmother   . Anxiety disorder Maternal Grandmother   . Schizophrenia Maternal Grandmother     Social History Social History   Tobacco Use  . Smoking status: Former Smoker    Packs/day: 0.25    Years: 4.00    Pack years: 1.00    Types: Cigarettes    Quit date: 05/27/2014    Years since quitting: 6.1  . Smokeless tobacco: Never Used  Vaping Use  . Vaping Use: Never used  Substance Use Topics  . Alcohol use: Yes    Alcohol/week: 0.0 standard drinks    Comment: rarely  . Drug use: No     Allergies   Codeine and Hydrocodone-acetaminophen   Review of Systems Review of Systems   Physical Exam Triage Vital Signs ED Triage Vitals  Enc Vitals Group     BP 07/04/20 0842  (!) 148/99     Pulse Rate 07/04/20 0842 80     Resp 07/04/20 0842 18     Temp 07/04/20 0842 98 F (36.7 C)     Temp Source 07/04/20 0842 Oral     SpO2 07/04/20 0842 97 %     Weight --      Height --      Head Circumference --      Peak Flow --      Pain Score 07/04/20 0850 7     Pain Loc --      Pain Edu? --      Excl. in [place]? --    No data found.  Updated Vital Signs BP (!) 148/99   Pulse 80   Temp 98 F (36.7 C) (Oral)   Resp 18   LMP  (LMP Unknown)   SpO2 97%    Physical Exam Constitutional:      General: She is not in acute distress.    Appearance: She is well-developed.  Cardiovascular:     Rate and Rhythm: Normal rate.  Pulmonary:     Effort: Pulmonary effort is normal.  Musculoskeletal:       Feet:  Feet:     Comments:  Bruising and tenderness to right 3rd and 4th MTP joints. Sensation intact. cap refill < 2 seconds  Skin:    General: Skin is warm and dry.  Neurological:     Mental Status: She is alert and oriented to person, place, and time.      UC Treatments / Results  Labs (all labs ordered are listed, but only abnormal results are displayed) Labs Reviewed - No data to display  EKG   Radiology DG Foot Complete Right  Result Date: 07/04/2020 CLINICAL DATA:  Fall, lateral foot pain, initial encounter. EXAM: RIGHT FOOT COMPLETE - 3+ VIEW COMPARISON:  None. FINDINGS: No acute osseous or joint abnormality. IMPRESSION: No acute osseous or joint abnormality. Electronically Signed   By: Lorin Picket M.D.   On: 07/04/2020 09:13    Procedures Procedures (including critical care time)  Medications Ordered in UC Medications - No data to display  Initial Impression / Assessment and Plan / UC Course  I have reviewed the triage vital signs and the nursing notes.  Pertinent labs & imaging results that were available during my care of the patient were reviewed by me and considered in my medical decision making (see chart  for details).     Xray  without acute findings; consistent with sprain. Post op shoe provided. Pain management and expected course of rehab discussed. Patient verbalized understanding and agreeable to plan.  Ambulatory out of clinic without difficulty.    Final Clinical Impressions(s) / UC Diagnoses   Final diagnoses:  Sprain of right foot, initial encounter     Discharge Instructions     Your xray is normal today which is reassuring.  Activity as tolerated.  Ice, elevation, ibuprofen as needed for pain.  Use of the provided shoe for comfort with activity.  Follow up with orthopedics or podiatry as needed if symptoms linger or persist greater than 4 weeks.     ED Prescriptions    Medication Sig Dispense Auth. Provider   ibuprofen (ADVIL) 800 MG tablet Take 1 tablet (800 mg total) by mouth 3 (three) times daily. 30 tablet Zigmund Gottron, NP     PDMP not reviewed this encounter.   Zigmund Gottron, NP 07/04/20 1525

## 2020-07-04 NOTE — ED Triage Notes (Signed)
Pt presents with right foot injury from fall 2 days ago

## 2020-07-15 NOTE — Progress Notes (Signed)
Virtual Visit via Video Note  I connected with Megan Houston on 07/21/20 at  3:00 PM EDT by a video enabled telemedicine application and verified that I am speaking with the correct person using two identifiers.  Location: Patient: home Provider: office Persons participated in the visit- patient, provider   I discussed the limitations of evaluation and management by telemedicine and the availability of in person appointments. The patient expressed understanding and agreed to proceed.     I discussed the assessment and treatment plan with the patient. The patient was provided an opportunity to ask questions and all were answered. The patient agreed with the plan and demonstrated an understanding of the instructions.   The patient was advised to call back or seek an in-person evaluation if the symptoms worsen or if the condition fails to improve as anticipated.  I provided 30 minutes of non-face-to-face time during this encounter.   Norman Clay, MD     [facility] MD/PA/NP OP Progress Note  07/21/2020 3:51 PM Megan Houston  MRN:  [id number]  Chief Complaint:  Chief Complaint    Follow-up; Depression     HPI:  Megan Houston is a 30 y.o. year old female with a history of depression, anxiety, who is transferred care from Dr. Toy Care.   This is a follow-up appointment for depression and anxiety.  She states that she has been feeling better compared to the time she talked with Dr. Toy Care a few months ago.  She believes it has been related with hormonal change, which she attributes to COVID she had on Feb 12th.  She sees OB/GYN to restore her menstrual cycle. She has lingering symptoms of fatigue from COVID.  She states that she just found out that she is not ovulating well.  She feels like a failure.  She has been trying conception for the past several months.  Her husband has been very supportive.  She states that she has been struggling with depression since teenager.  She was severely  depressed in her 20's when she had argument with her mother.  She did not talk with her for about an year, and she was never close with her mother.  She felt that her mother chose her boyfriend over her, and felt betrayal.  She misses her grandmother, who gave her unconditional love.  She passed away 2 years ago, and her birthday was a few days ago.  She has depressive symptoms as in PHQ-9.  She feels anxious and tense at times.  She denies panic attacks.  She has been eating healthy, and has lost a few pounds.  She does not think her mood symptoms has affected her much lately, and she wants to stay on the current dose of fluoxetine.  She takes clonazepam only occasionally.  She denies alcohol use.  She uses THC on weekends to feel better.   Bipolar-she was diagnosed with bipolar 2 disorder years ago.  She denies decreased need for sleep.  Although she occasionally feels highly energetic, and may clean the whole house, this lasts only for a few hours up to a day.  She denies impulsivity or increase in goal directed behavior.   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  Although her father was in and out of her life, she reports improvement in their relationship.    Visit Diagnosis:    ICD-10-CM   1. Anxiety  F41.9 clonazePAM (KLONOPIN) 0.5 MG tablet    FLUoxetine (PROZAC) 20 MG capsule  2. MDD (major depressive disorder), recurrent episode, mild ([facility])  F33.0 FLUoxetine (PROZAC) 20 MG capsule  3. PMDD (premenstrual dysphoric disorder)  F32.81 FLUoxetine  (PROZAC) 20 MG capsule    Past Psychiatric History:  Outpatient: used to be seen by Dr. Toy Care. Diagnosed with bipolar II disorder at age 23 Psychiatry admission: denies Previous suicide attempt: denies Past trials of medication: sertraline, lamotrigine, Buspar,  History of violence:   Past Medical History:  Past Medical History:  Diagnosis Date  . Anxiety   . Asthma   . Bipolar disorder ([place])   . Depression     Past Surgical History:  Procedure Laterality Date  . LIPOMA EXCISION N/A 01/01/2020   Procedure: EXCISION of LIPOMA from BACK;  Surgeon: Aviva Signs, MD;  Location: [facility];  Service: General;  Laterality: N/A;    Family Psychiatric History: as below  Family History:  Family History  Problem Relation Age of Onset  . Hypertension Other   . Diabetes Other   . Hypertension Mother   . Bipolar disorder Mother   . Alcohol abuse Mother   . Drug abuse Mother   . Cancer Mother        cervical  . OCD Father   . Anxiety disorder Father   . Diabetes Father   . Hypertension Father   . Bipolar disorder Sister   . Drug abuse Sister   . Alcohol abuse Sister   . Liver disease Paternal Grandmother   . Diabetes Maternal Grandmother   . Bipolar disorder Maternal Grandmother   . Anxiety disorder Maternal Grandmother   . Schizophrenia Maternal Grandmother     Social History:  Social History   Socioeconomic History  . Marital status: Married    Spouse name: Not on file  . Number of children: 0  . Years of education: Not on file  . Highest education level: Some college, no degree  Occupational History  . Not on file  Tobacco Use  . Smoking status: Former Smoker    Packs/day: 0.25    Years: 4.00    Pack years: 1.00    Types: Cigarettes    Quit date: 05/27/2014    Years since quitting: 6.1  . Smokeless tobacco: Never Used  Vaping Use  . Vaping Use: Never used  Substance and Sexual Activity  . Alcohol use: Yes    Alcohol/week: 0.0 standard drinks    Comment:  rarely  . Drug use: No  . Sexual activity: Yes    Birth control/protection: None  Other Topics Concern  . Not on file  Social History Narrative  . Not on file   Social Determinants of Health   Financial Resource Strain: Low Risk   . Difficulty of Paying Living Expenses: Not hard at all  Food Insecurity: No Food Insecurity  . Worried About Charity fundraiser in the Last Year: Never true  . Ran Out of Food in the Last Year: Never true  Transportation Needs: No Transportation Needs  . Lack of Transportation (Medical): No  . Lack of Transportation (Non-Medical): No  Physical Activity: Insufficiently  Active  . Days of Exercise per Week: 3 days  . Minutes of Exercise per Session: 30 min  Stress: Stress Concern Present  . Feeling of Stress : Rather much  Social Connections: Moderately Isolated  . Frequency of Communication with Friends and Family: More than three times a week  . Frequency of Social Gatherings with Friends and Family: Once a week  . Attends Religious Services: Never  . Active Member of Clubs or Organizations: No  . Attends Archivist Meetings: Never  . Marital Status: Married    Allergies:  Allergies  Allergen Reactions  . Codeine Nausea And Vomiting  . Hydrocodone-Acetaminophen Nausea And Vomiting    Metabolic Disorder Labs: No results found for: HGBA1C, MPG No results found for: PROLACTIN No results found for: CHOL, TRIG, HDL, CHOLHDL, VLDL, LDLCALC No results found for: TSH  Therapeutic Level Labs: No results found for: LITHIUM No results found for: VALPROATE No components found for:  CBMZ  Current Medications: Current Outpatient Medications  Medication Sig Dispense Refill  . carboxymethylcellulose (REFRESH PLUS) 0.5 % SOLN Place 1 drop into both eyes at bedtime.    . clonazePAM (KLONOPIN) 0.5 MG tablet Take 1 tablet (0.5 mg total) by mouth daily as needed. 30 tablet 0  . FLUoxetine (PROZAC) 20 MG capsule Take 1 capsule (20 mg total) by  mouth daily. 30 capsule 1  . ibuprofen (ADVIL) 800 MG tablet Take 1 tablet (800 mg total) by mouth 3 (three) times daily. 30 tablet 0  . medroxyPROGESTERone (PROVERA) 10 MG tablet Take 1 daily for 10 days 10 tablet 0  . Prenatal Vit-Fe Fumarate-FA (PRENATAL MULTIVITAMIN) TABS tablet Take 1 tablet by mouth daily at 12 noon.     No current facility-administered medications for this visit.     Musculoskeletal: Strength & Muscle Tone: N/A Gait & Station: N/A Patient leans: N/A  Psychiatric Specialty Exam: Review of Systems  Psychiatric/Behavioral: Positive for decreased concentration and dysphoric mood. Negative for agitation, behavioral problems, confusion, hallucinations, self-injury, sleep disturbance and suicidal ideas. The patient is nervous/anxious. The patient is not hyperactive.   All other systems reviewed and are negative.   There were no vitals taken for this visit.There is no height or weight on file to calculate BMI.  General Appearance: Fairly Groomed  Eye Contact:  Good  Speech:  Clear and Coherent  Volume:  Normal  Mood:  Anxious  Affect:  Appropriate, Congruent, Tearful and reactive  Thought Process:  Coherent  Orientation:  Full (Time, Place, and Person)  Thought Content: Logical   Suicidal Thoughts:  No  Homicidal Thoughts:  No  Memory:  Immediate;   Good  Judgement:  Good  Insight:  Good  Psychomotor Activity:  Normal  Concentration:  Concentration: Good and Attention Span: Good  Recall:  Good  Fund of Knowledge: Good  Language: Good  Akathisia:  No  Handed:  Right  AIMS (if indicated): not done  Assets:  Communication Skills Desire for Improvement  ADL's:  Intact  Cognition: WNL  Sleep:  Good   Screenings: AUDIT   Flowsheet Row Office Visit from 04/01/2020 in [facility]  Alcohol Use Disorder Identification Test Final Score (AUDIT) 3    GAD-7   Flowsheet Row Office Visit from 04/01/2020 in [place]  Total GAD-7 Score 7     PHQ2-9   Flowsheet Row Video Visit from 07/21/2020 in [place] Office Visit from 04/01/2020 in [place] Office Visit from  07/11/2017 in [facility]  PHQ-2 Total Score 2 2 0  PHQ-9 Total Score 7 8 --    Flowsheet Row Video Visit from 07/21/2020 in [place] [facility] from 07/04/2020 in [facility] No Risk No Risk       Assessment and Plan:  Megan Houston is a 30 y.o. year old female with a history of depression, anxiety, who is transferred care from Dr. Toy Care.   1. Anxiety 2. MDD (major depressive disorder), recurrent episode, mild ([place]) 3. 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  Other psychosocial stressors includes grief of the loss of her grandmother in [place], and conflict with her mother.  Although she may benefit from up titration of fluoxetine, she prefers to stay on the current dose as she thinks she has been doing well.  Will continue clonazepam as needed for anxiety.  She verbalized understanding that the this medication need to be discontinued when she is pregnant.   Plan 1. Continue fluoxetine 20 mg daily 2.  Continue clonazepam 0.5 mg daily as needed for anxiety 3.  Next appointment-July 21 at 230 for 30 minutes - will check TSH at the next visit if that is not recently checked  The patient demonstrates the following risk factors for suicide: Chronic risk factors for suicide include: psychiatric disorder of depression, anxiety. Acute risk factors for suicide include: loss (financial, interpersonal, professional). Protective factors for this patient include: positive social support, coping skills and hope for the future. Considering these factors, the overall suicide risk at this point appears to be low. Patient is appropriate for outpatient follow  up.    Norman Clay, MD 07/21/2020, 3:51 PM

## 2020-07-20 DIAGNOSIS — Z319 Encounter for procreative management, unspecified: Secondary | ICD-10-CM | POA: Diagnosis not present

## 2020-07-21 ENCOUNTER — Telehealth (INDEPENDENT_AMBULATORY_CARE_PROVIDER_SITE_OTHER): Payer: BC Managed Care – PPO | Admitting: Psychiatry

## 2020-07-21 ENCOUNTER — Other Ambulatory Visit: Payer: Self-pay

## 2020-07-21 ENCOUNTER — Encounter: Payer: Self-pay | Admitting: Psychiatry

## 2020-07-21 DIAGNOSIS — F3281 Premenstrual dysphoric disorder: Secondary | ICD-10-CM | POA: Diagnosis not present

## 2020-07-21 DIAGNOSIS — F419 Anxiety disorder, unspecified: Secondary | ICD-10-CM

## 2020-07-21 DIAGNOSIS — F330 Major depressive disorder, recurrent, mild: Secondary | ICD-10-CM

## 2020-07-21 LAB — PROGESTERONE: Progesterone: 0.1 ng/mL

## 2020-07-21 MED ORDER — FLUOXETINE HCL 20 MG PO CAPS: 20.0000 mg | ORAL_CAPSULE | Freq: Every day | ORAL | 1 refills | Status: DC

## 2020-07-21 MED ORDER — CLONAZEPAM 0.5 MG PO TABS: 0.5000 mg | ORAL_TABLET | Freq: Every day | ORAL | 0 refills | Status: DC | PRN

## 2020-07-27 ENCOUNTER — Other Ambulatory Visit: Payer: Self-pay | Admitting: Adult Health

## 2020-07-27 MED ORDER — VALACYCLOVIR HCL 1 G PO TABS: ORAL_TABLET | ORAL | 0 refills | Status: DC

## 2020-07-27 NOTE — Progress Notes (Signed)
rx valtrex for cold sore

## 2020-08-04 ENCOUNTER — Encounter: Payer: Self-pay | Admitting: Adult Health

## 2020-08-04 ENCOUNTER — Ambulatory Visit (INDEPENDENT_AMBULATORY_CARE_PROVIDER_SITE_OTHER): Payer: BC Managed Care – PPO | Admitting: Adult Health

## 2020-08-04 ENCOUNTER — Other Ambulatory Visit: Payer: Self-pay

## 2020-08-04 VITALS — BP 121/73 | HR 77 | Ht 65.5 in | Wt 238.5 lb

## 2020-08-04 DIAGNOSIS — N926 Irregular menstruation, unspecified: Secondary | ICD-10-CM

## 2020-08-04 DIAGNOSIS — Z319 Encounter for procreative management, unspecified: Secondary | ICD-10-CM | POA: Diagnosis not present

## 2020-08-04 LAB — POCT URINE PREGNANCY: Preg Test, Ur: NEGATIVE

## 2020-08-04 MED ORDER — LETROZOLE 2.5 MG PO TABS: ORAL_TABLET | ORAL | 2 refills | Status: DC

## 2020-08-04 NOTE — Progress Notes (Signed)
  Subjective:     Patient ID: Megan Houston, female   DOB: August 26, 1990, 30 y.o.   MRN: [id number]  HPI Megan Houston is a 30 year old white female, married, G0P0 , in to talk about getting pregnant, her progesterone level was 0.1 on 07/20/20. She says periods not regular now. Lab Results  Component Value Date   DIAGPAP  04/01/2020    - Negative for intraepithelial lesion or malignancy (NILM)   [place] Negative 04/01/2020    PCP is Delman Cheadle PA  Review of Systems Periods not regular. Reviewed past medical,surgical, social and family history. Reviewed medications and allergies.     Objective:   Physical Exam BP 121/73 (BP Location: Left Arm, Patient Position: Sitting, Cuff Size: Large)   Pulse 77   Ht 5' 5.5" (1.664 m)   Wt 238 lb 8 oz (108.2 kg)   LMP 07/01/2020   BMI 39.08 kg/m  UPT is negative. Skin warm and dry.Lungs: clear to ausculation bilaterally. Cardiovascular: regular rate and rhythm.  Fall risk is low  Upstream - 08/04/20 0931       Pregnancy Intention Screening   Does the patient want to become pregnant in the next year? Yes    Does the patient's partner want to become pregnant in the next year? Yes    Would the patient like to discuss contraceptive options today? No      Contraception Wrap Up   Current Method Pregnant/Seeking Pregnancy    End Method Pregnant/Seeking Pregnancy    Contraception Counseling Provided No                Assessment:     1. Patient desires pregnancy Continue PNV Will try Femara,start today, have sex every other day 7-24 of cycle and pee before sex, and lay there 20-30 minutes after   Meds ordered this encounter  Medications   letrozole ([place]) 2.5 MG tablet    Sig: Take 1 day 3-7 of cycle    Dispense:  5 tablet    Refill:  2    Order Specific Question:   Supervising Provider    Answer:   Elonda Husky, LUTHER H [[id number]]     2. Irregular periods  Plan:     Check progesterone level July 5, order given

## 2020-08-04 NOTE — Patient Instructions (Signed)
Preparing for Pregnancy If you are planning to become pregnant, talk to your health care provider about preconception care. This type of care helps you prepare for a safe and healthy pregnancy. During this visit, your health care provider will: Do a complete physical exam, including a Pap test. Take your complete medical history. Give you information, answer your questions, and help you resolve problems. Preconception checklist Medical history Tell your health care provider about any medical conditions you have or have had. Your pregnancy or your ability to become pregnant may be affected by long-term (chronic) conditions, such as: Diabetes. High blood pressure (hypertension). Thyroid problems. Tell your health care provider about your family's medical history and your partner's medical history. Tell your health care provider if you have or have had any sexually transmitted infections, orSTIs. These can affect your pregnancy. In some cases, they can be passed to your baby. If needed, discuss the benefits of genetic testing. This test checks for conditions that may be passed from parent to child. Tell your health care provider about: Any problems you had getting pregnant or while pregnant. Any medicines you take. These include vitamins, herbal supplements, and over-the-counter medicines. Your history of getting vaccines. Discuss any vaccines that you may need. Diet Ask your health care provider about what foods to eat in order to get a balance of nutrients. This is especially important when you are pregnant or preparing to become pregnant. It is recommended that women of childbearing age take a folic acid supplement of 400 mcg daily and eat foods rich in folic acid to prevent certain birth defects. Ask your health care provider to help you reach a healthy weight before pregnancy. If you are overweight, you may have a higher risk for certain problems. These include hypertension, diabetes, and  early (preterm) birth. If you are underweight, you are more likely to have a baby who has a low birth weight. Lifestyle, work, and home Let your health care provider know about: Any lifestyle habits that you have, such as use of alcohol, drugs, or tobacco products. Fun and leisure activities that may put you at risk during pregnancy, such as downhill skiing and certain exercise programs. Any plans to travel out of the country, especially to places with an active [place] virus outbreak. Harmful substances that you may be exposed to at work or at home. These include chemicals, pesticides, radiation, and substances from cat litter boxes. Any concerns you have for your safety at home. Mental health Tell your health care provider about: Any history of mental health conditions, including feelings of depression, sadness, or anxiety. Any medicines that you take for a mental health condition. These include herbs and supplements. How do I know that I am pregnant? You may be pregnant if you have been sexually active and you miss your period. Other symptoms of early pregnancy include: Mild cramping. Very light vaginal bleeding (spotting). Feeling more tired than usual. Nausea and vomiting. These may be signs of morning sickness. Take a home pregnancy test if you have any of these symptoms. This test checks for a hormone in your urine called human chorionic gonadotropin, or hCG. A woman's body begins to make this hormone during early pregnancy. These testsare very accurate. Wait until at least the first day after you miss your period to take a home pregnancy test. If the test shows that you are pregnant, call your health careprovider for a prenatal care visit. What should I do if I become pregnant? Schedule a visit with  your health care provider as soon as you suspect you are pregnant. Talk to your health care provider if you are taking prescription medicines to determine if they are safe to take during  pregnancy. You may continue to have sex if it does not cause pain or other problems, such as vaginal bleeding. Follow these instructions at home: Eating and drinking  Follow instructions from your health care provider about eating or drinking restrictions. Drink enough fluid to keep your urine pale yellow. Eat a balanced diet. This includes fresh fruits and vegetables, whole grains, lean meats, low-fat dairy products, healthy fats, and foods that are high in fiber. Ask to meet with a nutritionist or registered dietitian for help with meal planning and goals. Avoid eating raw or undercooked meat and seafood. Avoid eating or drinking unpasteurized dairy products.  Lifestyle     Get regular exercise. Try to be active for at least 30 minutes a day on most days of the week. Ask your health care provider which activities are safe during pregnancy. Maintain a healthy weight. Avoid toxic fumes and chemicals. Avoid cleaning cat litter boxes. Cat feces may contain a harmful parasite called toxoplasma. Avoid travel to countries where [place] virus is common. Do not use any products that contain nicotine or tobacco, such as cigarettes, e-cigarettes, and chewing tobacco. If you need help quitting, ask your health care provider. Do not drink alcohol or use drugs. General instructions Keep an accurate record of your menstrual periods. This makes it easier for your health care provider to determine your baby's due date. Take over-the-counter and prescription medicines only as told by your health care provider. Begin taking prenatal vitamins and folic acid supplements daily as directed. Manage any chronic conditions, such as hypertension and diabetes, as told by your health care provider. This is important. Summary If you are planning to become pregnant, talk to your health care provider about preconception care. This is an important part of planning for a healthy pregnancy. Women of childbearing age should  take 086 mcg of folic acid daily in addition to eating a diet rich in folic acid. This will prevent certain birth defects. Schedule a visit with your health care provider as soon as you suspect you are pregnant. Tell your health care provider about your medical history, lifestyle activities, home safety, and other things that may concern you. This information is not intended to replace advice given to you by your health care provider. Make sure you discuss any questions you have with your healthcare provider. Document Revised: 11/05/2018 Document Reviewed: 11/05/2018 Elsevier Patient Education  [employer].

## 2020-08-23 DIAGNOSIS — Z319 Encounter for procreative management, unspecified: Secondary | ICD-10-CM | POA: Diagnosis not present

## 2020-08-24 ENCOUNTER — Other Ambulatory Visit: Payer: Self-pay | Admitting: Adult Health

## 2020-08-24 DIAGNOSIS — N926 Irregular menstruation, unspecified: Secondary | ICD-10-CM

## 2020-08-24 DIAGNOSIS — Z319 Encounter for procreative management, unspecified: Secondary | ICD-10-CM

## 2020-08-24 LAB — PROGESTERONE: Progesterone: 0.5 ng/mL

## 2020-09-06 NOTE — Progress Notes (Signed)
Virtual Visit via Video Note  I connected with Megan Houston on 09/08/20 at  2:30 PM EDT by a video enabled telemedicine application and verified that I am speaking with the correct person using two identifiers.  Location: Patient: home Provider: office Persons participated in the visit- patient, provider    I discussed the limitations of evaluation and management by telemedicine and the availability of in person appointments. The patient expressed understanding and agreed to proceed.     I discussed the assessment and treatment plan with the patient. The patient was provided an opportunity to ask questions and all were answered. The patient agreed with the plan and demonstrated an understanding of the instructions.   The patient was advised to call back or seek an in-person evaluation if the symptoms worsen or if the condition fails to improve as anticipated.  I provided 18 minutes of non-face-to-face time during this encounter.   Norman Clay, MD    [facility] MD/PA/NP OP Progress Note  09/08/2020 2:59 PM Megan Houston  MRN:  [id number]  Chief Complaint:  Chief Complaint   Depression; Follow-up; Anxiety    HPI:  This is a follow-up appointment for depression and anxiety.  She states that she has been doing well at work.  However, it has been stressful outside of work.  She has been trying to conceive, and she is on second cycle of Femara.  Although she went to a trip to [place], it was not good as she found out the blood test result, which was negative for pregnancy.  Although she tries to do worry time, and not thinking about it, it has been difficult and she tends to over think at times.  She would like to hold therapy at this time due to financial strain, scheduling issues.  She has occasional insomnia.  She was feeling down when she received a test, although it has been getting better.  She has lost weight as she is unable to eat due to stomach pain; she has an appointment  with her PCP.  She denies SI.  She feels anxious and tense, at least several times a week.  She takes clonazepam almost every day.  She denies panic attack.  She is willing to try higher dose of fluoxetine at this time.   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  Although her father was in and out of her life, she reports improvement in their relationship.   Visit Diagnosis:    ICD-10-CM   1. MDD (major depressive disorder), recurrent episode, mild ([place])  F33.0     2. Anxiety  F41.9 TSH    clonazePAM (KLONOPIN) 0.5 MG tablet    3. PMDD (premenstrual dysphoric disorder)  F32.81       Past Psychiatric History: Please see initial evaluation for full details. I have reviewed the history. No updates at this time.     Past Medical History:  Past Medical History:  Diagnosis Date   Anxiety    Asthma    Bipolar disorder [facility])    Depression     Past Surgical History:  Procedure Laterality Date   LIPOMA EXCISION N/A 01/01/2020  Procedure: EXCISION of LIPOMA from BACK;  Surgeon: Aviva Signs, MD;  Location: [facility];  Service: General;  Laterality: N/A;    Family Psychiatric History: Please see initial evaluation for full details. I have reviewed the history. No updates at this time.     Family History:  Family History  Problem Relation Age of Onset   Hypertension Other    Diabetes Other    Hypertension Mother    Bipolar disorder Mother    Alcohol abuse Mother    Drug abuse Mother    Cancer Mother        cervical   OCD Father    Anxiety disorder Father    Diabetes Father    Hypertension Father    Bipolar disorder  Sister    Drug abuse Sister    Alcohol abuse Sister    Liver disease Paternal Grandmother    Diabetes Maternal Grandmother    Bipolar disorder Maternal Grandmother    Anxiety disorder Maternal Grandmother    Schizophrenia Maternal Grandmother     Social History:  Social History   Socioeconomic History   Marital status: Married    Spouse name: Not on file   Number of children: 0   Years of education: Not on file   Highest education level: Some college, no degree  Occupational History   Not on file  Tobacco Use   Smoking status: Former    Packs/day: 0.25    Years: 4.00    Pack years: 1.00    Types: Cigarettes    Quit date: 05/27/2014    Years since quitting: 6.2   Smokeless tobacco: Never  Vaping Use   Vaping Use: Never used  Substance and Sexual Activity   Alcohol use: Yes    Alcohol/week: 0.0 standard drinks    Comment: rarely   Drug use: No   Sexual activity: Yes    Birth control/protection: None  Other Topics Concern   Not on file  Social History Narrative   Not on file   Social Determinants of Health   Financial Resource Strain: Low Risk    Difficulty of Paying Living Expenses: Not hard at all  Food Insecurity: No Food Insecurity   Worried About Charity fundraiser in the Last Year: Never true   [place] in the Last Year: Never true  Transportation Needs: No Transportation Needs   Lack of Transportation (Medical): No   Lack of Transportation (Non-Medical): No  Physical Activity: Insufficiently Active   Days of Exercise per Week: 3 days   Minutes of Exercise per Session: 30 min  Stress: Stress Concern Present   Feeling of Stress : Rather much  Social Connections: Moderately Isolated   Frequency of Communication with Friends and Family: More than three times a week   Frequency of Social Gatherings with Friends and Family: Once a week   Attends Religious Services: Never   Marine scientist or Organizations: No   Attends Archivist  Meetings: Never   Marital Status: Married    Allergies:  Allergies  Allergen Reactions   Codeine Nausea And Vomiting   Hydrocodone-Acetaminophen Nausea And Vomiting    Metabolic Disorder Labs: No results found for: HGBA1C, MPG No results found for: PROLACTIN No results found for: CHOL, TRIG, HDL, CHOLHDL, VLDL, LDLCALC No results found for: TSH  Therapeutic Level Labs: No results found for: LITHIUM No results found for: VALPROATE No components found for:  CBMZ  Current Medications: Current Outpatient Medications  Medication Sig Dispense Refill   FLUoxetine (PROZAC) 10 MG capsule Take 3 capsules (30 mg total) by mouth daily. 90 capsule 1   carboxymethylcellulose (REFRESH PLUS) 0.5 % SOLN Place 1 drop into both eyes at bedtime.     clonazePAM (KLONOPIN) 0.5 MG tablet Take 1 tablet (0.5 mg total) by mouth daily as needed. 30 tablet 1   letrozole (FEMARA) 2.5 MG tablet Take 1 day 3-7 of cycle 5 tablet 2   Prenatal Vit-Fe Fumarate-FA (PRENATAL MULTIVITAMIN) TABS tablet Take 1 tablet by mouth daily at 12 noon.     No current facility-administered medications for this visit.     Musculoskeletal: Strength & Muscle Tone:  N/A Gait & Station:  N/A Patient leans: N/A  Psychiatric Specialty Exam: Review of Systems  Psychiatric/Behavioral:  Positive for dysphoric mood and sleep disturbance. Negative for agitation, behavioral problems, confusion, decreased concentration, hallucinations, self-injury and suicidal ideas. The patient is nervous/anxious. The patient is not hyperactive.   All other systems reviewed and are negative.  There were no vitals taken for this visit.There is no height or weight on file to calculate BMI.  General Appearance: Fairly Groomed  Eye Contact:  Good  Speech:  Clear and Coherent  Volume:  Normal  Mood:  Anxious  Affect:  Appropriate, Congruent, and down at times, but reactive  Thought Process:  Coherent  Orientation:  Full (Time, Place, and Person)   Thought Content: Logical   Suicidal Thoughts:  No  Homicidal Thoughts:  No  Memory:  Immediate;   Good  Judgement:  Good  Insight:  Good  Psychomotor Activity:  Normal  Concentration:  Concentration: Good and Attention Span: Good  Recall:  Good  Fund of Knowledge: Good  Language: Good  Akathisia:  No  Handed:  Right  AIMS (if indicated): not done  Assets:  Communication Skills Desire for Improvement  ADL's:  Intact  Cognition: WNL  Sleep:  Fair   Screenings: AUDIT    Flowsheet Row Office Visit from 04/01/2020 in [facility]  Alcohol Use Disorder Identification Test Final Score (AUDIT) 3      GAD-7    Flowsheet Row Office Visit from 04/01/2020 in [facility]  Total GAD-7 Score 7      PHQ2-9    Flowsheet Row Video Visit from 07/21/2020 in [facility] Office Visit from 04/01/2020 in [place] Office Visit from 07/11/2017 in [facility]  PHQ-2 Total Score 2 2 0  PHQ-9 Total Score 7 8 --      Flowsheet Row Video Visit from 07/21/2020 in [facility] from 07/04/2020 in [facility] [place] No Risk No Risk        Assessment and Plan:  Megan Houston is a 30 y.o. year old female with a history of  depression, anxiety, , who presents for follow up appointment for below.   1. Anxiety 2. MDD (major depressive disorder), recurrent episode, mild ([place]) 3. 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 Other psychosocial stressors includes grief of the loss of her grandmother in 1540, and conflict with her mother.  We uptitrate fluoxetine to optimize treatment for depression and anxiety.  Risk (which includes but not limited to spontaneous abortion) and benefit of staying on this medication has been discussed  with the patient.  Will continue clonazepam as needed for anxiety.  She verbalizes understanding that this medication will be discontinued when she becomes pregnant.    Plan 1. Increase fluoxetine 30 mg daily 2. Continue clonazepam 0.5 mg daily as needed for anxiety 3.  Next appointment-9/1 at 3 PM for 30 mins, video 4. Check TSH   The patient demonstrates the following risk factors for suicide: Chronic risk factors for suicide include: psychiatric disorder of depression, anxiety. Acute risk factors for suicide include: loss (financial, interpersonal, professional). Protective factors for this patient include: positive social support, coping skills and hope for the future. Considering these factors, the overall suicide risk at this point appears to be low. Patient is appropriate for outpatient follow up.           Norman Clay, MD 09/08/2020, 2:59 PM

## 2020-09-08 ENCOUNTER — Other Ambulatory Visit: Payer: Self-pay

## 2020-09-08 ENCOUNTER — Telehealth (INDEPENDENT_AMBULATORY_CARE_PROVIDER_SITE_OTHER): Payer: BC Managed Care – PPO | Admitting: Psychiatry

## 2020-09-08 ENCOUNTER — Encounter: Payer: Self-pay | Admitting: Psychiatry

## 2020-09-08 DIAGNOSIS — F419 Anxiety disorder, unspecified: Secondary | ICD-10-CM | POA: Diagnosis not present

## 2020-09-08 DIAGNOSIS — F3281 Premenstrual dysphoric disorder: Secondary | ICD-10-CM

## 2020-09-08 DIAGNOSIS — F330 Major depressive disorder, recurrent, mild: Secondary | ICD-10-CM

## 2020-09-08 MED ORDER — FLUOXETINE HCL 10 MG PO CAPS: 30.0000 mg | ORAL_CAPSULE | Freq: Every day | ORAL | 1 refills | Status: DC

## 2020-09-08 MED ORDER — CLONAZEPAM 0.5 MG PO TABS: 0.5000 mg | ORAL_TABLET | Freq: Every day | ORAL | 1 refills | Status: DC | PRN

## 2020-09-08 NOTE — Patient Instructions (Signed)
1. Increase fluoxetine 30 mg daily 2. Continue clonazepam 0.5 mg daily as needed for anxiety 3.  Next appointment-9/1 at 3 PM  4. Check TSH

## 2020-09-12 DIAGNOSIS — F419 Anxiety disorder, unspecified: Secondary | ICD-10-CM | POA: Diagnosis not present

## 2020-09-12 DIAGNOSIS — Z319 Encounter for procreative management, unspecified: Secondary | ICD-10-CM | POA: Diagnosis not present

## 2020-09-13 ENCOUNTER — Encounter: Payer: Self-pay | Admitting: Psychiatry

## 2020-09-13 DIAGNOSIS — Z1329 Encounter for screening for other suspected endocrine disorder: Secondary | ICD-10-CM

## 2020-09-13 LAB — PROGESTERONE: Progesterone: 15.9 ng/mL

## 2020-09-13 LAB — TSH: TSH: 4.74 u[IU]/mL — ABNORMAL HIGH (ref 0.450–4.500)

## 2020-09-14 ENCOUNTER — Encounter: Payer: Self-pay | Admitting: Psychiatry

## 2020-09-15 LAB — T4, FREE: Free T4: 0.86 ng/dL (ref 0.82–1.77)

## 2020-09-15 LAB — SPECIMEN STATUS REPORT

## 2020-09-20 ENCOUNTER — Ambulatory Visit (INDEPENDENT_AMBULATORY_CARE_PROVIDER_SITE_OTHER): Payer: BC Managed Care – PPO | Admitting: *Deleted

## 2020-09-20 ENCOUNTER — Other Ambulatory Visit: Payer: Self-pay

## 2020-09-20 VITALS — BP 132/90 | Ht 65.0 in | Wt 238.0 lb

## 2020-09-20 DIAGNOSIS — N926 Irregular menstruation, unspecified: Secondary | ICD-10-CM | POA: Diagnosis not present

## 2020-09-20 DIAGNOSIS — Z3201 Encounter for pregnancy test, result positive: Secondary | ICD-10-CM | POA: Diagnosis not present

## 2020-09-20 LAB — POCT URINE PREGNANCY: Preg Test, Ur: POSITIVE — AB

## 2020-09-20 NOTE — Progress Notes (Signed)
   NURSE VISIT- PREGNANCY CONFIRMATION   SUBJECTIVE:  Megan Houston is a 30 y.o. G1P0000 female at 66w0dby certain LMP of Patient's last menstrual period was 08/23/2020. Here for pregnancy confirmation.  Home pregnancy test: positive x 4   She reports no complaints.  She is taking prenatal vitamins.    OBJECTIVE:  BP 132/90 (BP Location: Left Arm, Patient Position: Sitting, Cuff Size: Large)   Ht '5\' 5"'$  (1.651 m)   Wt 238 lb (108 kg)   LMP 08/23/2020   BMI 39.61 kg/m   Appears well, in no apparent distress  Results for orders placed or performed in visit on 09/20/20 (from the past 24 hour(s))  POCT urine pregnancy   Collection Time: 09/20/20 11:10 AM  Result Value Ref Range   Preg Test, Ur Positive (A) Negative    ASSESSMENT: Positive pregnancy test, 425w0dy LMP    PLAN: Schedule for dating ultrasound in 3 weeks Prenatal vitamins: continue   Nausea medicines: not currently needed   OB packet given: Yes  LaAlice Rieger8/03/2020 11:59 AM

## 2020-09-20 NOTE — Progress Notes (Signed)
Chart reviewed for nurse visit. Agree with plan of care.  Estill Dooms, NP 09/20/2020 1:33 PM

## 2020-09-21 ENCOUNTER — Telehealth: Payer: Self-pay

## 2020-09-21 NOTE — Telephone Encounter (Signed)
Please let patient know to have an evaluation by Dr. Modesta Messing as soon as she returns to discuss further management given her current pregnancy.

## 2020-09-21 NOTE — Telephone Encounter (Signed)
pt called left message that she found out that she was pregnant and that she stopped her medications.

## 2020-09-22 NOTE — Telephone Encounter (Signed)
Contacted patient.  Discussed pregnancy implication of her antidepressant.  Discussed stopping the Klonopin.  Patient reported she contacted her OB/GYN since she had severe panic attack and chest pain and was advised to take 1 dose of Klonopin which she took which relieved her anxiety.  She is aware about the side effects of Klonopin during pregnancy.  At this point she is interested in continuing the Prozac.  However discussed establishing care with a therapist.  We will refer her to a therapist here at this office based on patient preference.  I will also route this message to Dr. Modesta Messing.

## 2020-09-22 NOTE — Telephone Encounter (Signed)
pt called states that she still taking the prozac that her anxiety is bad so she going to stay on that.

## 2020-09-28 ENCOUNTER — Ambulatory Visit (INDEPENDENT_AMBULATORY_CARE_PROVIDER_SITE_OTHER): Payer: BC Managed Care – PPO | Admitting: Licensed Clinical Social Worker

## 2020-09-28 ENCOUNTER — Other Ambulatory Visit: Payer: Self-pay

## 2020-09-28 DIAGNOSIS — F330 Major depressive disorder, recurrent, mild: Secondary | ICD-10-CM

## 2020-09-28 DIAGNOSIS — F419 Anxiety disorder, unspecified: Secondary | ICD-10-CM

## 2020-09-29 NOTE — Progress Notes (Signed)
Virtual Visit via Video Note  I connected with Megan Houston on 09/29/20 at  3:00 PM EDT by a video enabled telemedicine application and verified that I am speaking with the correct person using two identifiers.  Location: Patient: home Provider: [facility]   I discussed the limitations of evaluation and management by telemedicine and the availability of in person appointments. The patient expressed understanding and agreed to proceed.  I discussed the assessment and treatment plan with the patient. The patient was provided an opportunity to ask questions and all were answered. The patient agreed with the plan and demonstrated an understanding of the instructions.   The patient was advised to call back or seek an in-person evaluation if the symptoms worsen or if the condition fails to improve as anticipated.  I provided 60 minutes of non-face-to-face time during this encounter.   Megan Claw R Gryphon Vanderveen, LCSW   THERAPIST PROGRESS NOTE  Session Time: 3-4p  Participation Level: Active  Behavioral Response: Neat and Well GroomedAlertAnxious and Depressed  Type of Therapy: Individual Therapy  Treatment Goals addressed: Anxiety and Coping  Interventions: Supportive  Summary: Megan Houston is a 30 y.o. female who presents with Megan Houston is a 30 year old female presenting for continuation of counseling services to help manage stress, anxiety, and mood related changes. Patient reports that she has had counseling in the past and it was helpful. Allow patient safe space to explore and express thoughts and feelings associated with recent external stressors and patient reports that she recently had a falling out with her mother, which has triggered a lot of current stress and anxiety. Patient recently found out that she was pregnant and has discontinued mental health medication (prozac) that has been helpful for her in the past period patient reports and increase of anxiety after stopping the Prozac.  Patient reports that she has engaged in self harm behaviors in the past, but has not in a few months. Patient reports that she hits self with hand, sometimes hard enough to leave bruises. Reviewed alternative coping mechanisms. Reviewed and updated CCA and treatment plan. Continued recommendations are as follows: self care behaviors, positive social engagements, focusing on overall work/home/life balance, and focusing on positive physical and emotional wellness. .   Suicidal/Homicidal: No  Therapist Response: Clinical assessment and development of tx plan.   Plan: Return again in 4 weeks.  Diagnosis: Axis I: MDD, recurrent; GAD    Axis II: No diagnosis    Megan Bo Elianie Hubers, LCSW 09/29/2020

## 2020-10-10 ENCOUNTER — Other Ambulatory Visit: Payer: Self-pay | Admitting: Obstetrics & Gynecology

## 2020-10-10 DIAGNOSIS — O3680X0 Pregnancy with inconclusive fetal viability, not applicable or unspecified: Secondary | ICD-10-CM

## 2020-10-11 ENCOUNTER — Other Ambulatory Visit (INDEPENDENT_AMBULATORY_CARE_PROVIDER_SITE_OTHER): Payer: BC Managed Care – PPO

## 2020-10-11 ENCOUNTER — Ambulatory Visit (INDEPENDENT_AMBULATORY_CARE_PROVIDER_SITE_OTHER): Payer: BC Managed Care – PPO

## 2020-10-11 ENCOUNTER — Other Ambulatory Visit (HOSPITAL_COMMUNITY)
Admission: RE | Admit: 2020-10-11 | Discharge: 2020-10-11 | Disposition: A | Discharge status: Home / Self Care | Payer: BC Managed Care – PPO | Source: Ambulatory Visit | Attending: Obstetrics & Gynecology | Admitting: Obstetrics & Gynecology

## 2020-10-11 ENCOUNTER — Other Ambulatory Visit: Payer: Self-pay

## 2020-10-11 DIAGNOSIS — N898 Other specified noninflammatory disorders of vagina: Secondary | ICD-10-CM

## 2020-10-11 DIAGNOSIS — Z3A01 Less than 8 weeks gestation of pregnancy: Secondary | ICD-10-CM | POA: Diagnosis not present

## 2020-10-11 DIAGNOSIS — O3680X0 Pregnancy with inconclusive fetal viability, not applicable or unspecified: Secondary | ICD-10-CM

## 2020-10-11 NOTE — Progress Notes (Signed)
[place] 7 wks,single IUP with YS,positive FHT 137 bpm,normal ovaries,CRL 10.25 mm

## 2020-10-11 NOTE — Progress Notes (Signed)
Chart reviewed for nurse visit. Agree with plan of care.  Estill Dooms, NP 10/11/2020 12:27 PM

## 2020-10-11 NOTE — Progress Notes (Signed)
   NURSE VISIT- VAGINITIS  SUBJECTIVE:  Megan Houston is a 30 y.o. G1P0000 56w0dpregnantfemale here for a vaginal swab for vaginitis screening.  She reports the following symptoms: burning, discharge described as malodorous and green, and odor for 2 weeks. Denies abnormal vaginal bleeding, significant pelvic pain, fever, or UTI symptoms.  OBJECTIVE:  LMP 08/23/2020   Appears well, in no apparent distress  ASSESSMENT: Vaginal swab for vaginitis screening  PLAN: Self-collected vaginal probe for Bacterial Vaginosis, Yeast sent to lab Treatment: to be determined once results are received Follow-up as needed if symptoms persist/worsen, or new symptoms develop  Jamilah Jean A Ezequias Lard  10/11/2020 11:52 AM

## 2020-10-12 LAB — CERVICOVAGINAL ANCILLARY ONLY
Bacterial Vaginitis (gardnerella): NEGATIVE
Candida Glabrata: NEGATIVE
Candida Vaginitis: NEGATIVE
Comment: NEGATIVE
Comment: NEGATIVE
Comment: NEGATIVE

## 2020-10-19 NOTE — Progress Notes (Signed)
Virtual Visit via Video Note  I connected with Megan Houston on 10/20/20 at  3:00 PM EDT by a video enabled telemedicine application and verified that I am speaking with the correct person using two identifiers.  Location: Patient: home Provider: office Persons participated in the visit- patient, provider    I discussed the limitations of evaluation and management by telemedicine and the availability of in person appointments. The patient expressed understanding and agreed to proceed.    I discussed the assessment and treatment plan with the patient. The patient was provided an opportunity to ask questions and all were answered. The patient agreed with the plan and demonstrated an understanding of the instructions.   The patient was advised to call back or seek an in-person evaluation if the symptoms worsen or if the condition fails to improve as anticipated.  I provided 9 minutes of non-face-to-face time during this encounter.   Norman Clay, MD    [facility] MD/PA/NP OP Progress Note  10/20/2020 3:21 PM Megan Houston  MRN:  [id number]  Chief Complaint:  Chief Complaint   Follow-up; Depression    HPI:  This is a follow-up appointment for depression.  She states that she is [redacted] weeks pregnant.  Although she had 1 panic attack when she contacted the office, it subsided after taking Klonopin as advised by her OB/GYN.  She denies any feeling depressed, anhedonia, or anxiety since discontinuing fluoxetine about 8 weeks ago.  She is not interested in restarting this medication at this time as she does not want to be on any medication during pregnancy.  The baby is doing well, and she has follow-up with her OB/GYN.  She has insomnia, she attributes to nocturia.  She feels fatigue, although she does not think it is coming from depression.  She has fair concentration.  She has increasing in appetite.  She denies SI.  She denies any concern, and agrees to contact the office if any worsening in  her mood symptoms.   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  Although her father was in and out of her life, she reports improvement in their relationship.   Visit Diagnosis:    ICD-10-CM   1. MDD (major depressive disorder), recurrent, in partial remission ([place])  F33.41     2. PMDD (premenstrual dysphoric disorder)  F32.81       Past Psychiatric History: Please see initial evaluation for full details. I have reviewed the history. No updates at this time.    Past Medical History:  Past Medical History:  Diagnosis Date   Anxiety    Asthma    Bipolar disorder ([place])    Depression     Past Surgical History:  Procedure Laterality Date   LIPOMA EXCISION N/A 01/01/2020   Procedure: EXCISION of LIPOMA from BACK;  Surgeon: Aviva Signs, MD;  Location: [facility];  Service: General;  Laterality: N/A;    Family Psychiatric History: Please see initial evaluation for full details. I have reviewed the history. No updates at this time.     Family History:  Family History  Problem Relation Age of Onset   Hypertension Other  Diabetes Other    Hypertension Mother    Bipolar disorder Mother    Alcohol abuse Mother    Drug abuse Mother    Cancer Mother        cervical   OCD Father    Anxiety disorder Father    Diabetes Father    Hypertension Father    Bipolar disorder Sister    Drug abuse Sister    Alcohol abuse Sister    Liver disease Paternal Grandmother    Diabetes Maternal Grandmother    Bipolar disorder Maternal Grandmother    Anxiety disorder Maternal Grandmother    Schizophrenia Maternal Grandmother     Social  History:  Social History   Socioeconomic History   Marital status: Married    Spouse name: Not on file   Number of children: 0   Years of education: Not on file   Highest education level: Some college, no degree  Occupational History   Not on file  Tobacco Use   Smoking status: Former    Packs/day: 0.25    Years: 4.00    Pack years: 1.00    Types: Cigarettes    Quit date: 05/27/2014    Years since quitting: 6.4   Smokeless tobacco: Never  Vaping Use   Vaping Use: Never used  Substance and Sexual Activity   Alcohol use: Not Currently    Comment: rarely   Drug use: No   Sexual activity: Yes    Birth control/protection: None  Other Topics Concern   Not on file  Social History Narrative   Not on file   Social Determinants of Health   Financial Resource Strain: Low Risk    Difficulty of Paying Living Expenses: Not hard at all  Food Insecurity: No Food Insecurity   Worried About Charity fundraiser in the Last Year: Never true   Ran Out of Food in the Last Year: Never true  Transportation Needs: No Transportation Needs   Lack of Transportation (Medical): No   Lack of Transportation (Non-Medical): No  Physical Activity: Insufficiently Active   Days of Exercise per Week: 3 days   Minutes of Exercise per Session: 30 min  Stress: Stress Concern Present   Feeling of Stress : Rather much  Social Connections: Moderately Isolated   Frequency of Communication with Friends and Family: More than three times a week   Frequency of Social Gatherings with Friends and Family: Once a week   Attends Religious Services: Never   Marine scientist or Organizations: No   Attends Archivist Meetings: Never   Marital Status: Married    Allergies:  Allergies  Allergen Reactions   Codeine Nausea And Vomiting   Hydrocodone-Acetaminophen Nausea And Vomiting    Metabolic Disorder Labs: No results found for: HGBA1C, MPG No results found for: PROLACTIN No results found for:  CHOL, TRIG, HDL, CHOLHDL, VLDL, LDLCALC Lab Results  Component Value Date   TSH 4.740 (H) 09/12/2020    Therapeutic Level Labs: No results found for: LITHIUM No results found for: VALPROATE No components found for:  CBMZ  Current Medications: Current Outpatient Medications  Medication Sig Dispense Refill   carboxymethylcellulose (REFRESH PLUS) 0.5 % SOLN Place 1 drop into both eyes at bedtime. (Patient not taking: Reported on 09/20/2020)     clonazePAM (KLONOPIN) 0.5 MG tablet Take 1 tablet (0.5 mg total) by mouth daily as needed. (Patient not taking: Reported on 09/20/2020) 30 tablet 1   FLUoxetine (PROZAC) 10 MG capsule Take  3 capsules (30 mg total) by mouth daily. (Patient not taking: Reported on 09/20/2020) 90 capsule 1   letrozole (FEMARA) 2.5 MG tablet Take 1 day 3-7 of cycle (Patient not taking: Reported on 09/20/2020) 5 tablet 2   Prenatal Vit-Fe Fumarate-FA (PRENATAL MULTIVITAMIN) TABS tablet Take 1 tablet by mouth daily at 12 noon.     No current facility-administered medications for this visit.     Musculoskeletal: Strength & Muscle Tone:  N/A Gait & Station:  N/A Patient leans: N/A  Psychiatric Specialty Exam: Review of Systems  Psychiatric/Behavioral:  Positive for sleep disturbance. Negative for agitation, behavioral problems, confusion, decreased concentration, dysphoric mood, hallucinations, self-injury and suicidal ideas. The patient is not nervous/anxious and is not hyperactive.   All other systems reviewed and are negative.  Last menstrual period 08/23/2020.There is no height or weight on file to calculate BMI.  General Appearance: Fairly Groomed  Eye Contact:  Good  Speech:  Clear and Coherent  Volume:  Normal  Mood:   good  Affect:  Appropriate, Congruent, and euthymic  Thought Process:  Coherent  Orientation:  Full (Time, Place, and Person)  Thought Content: Logical   Suicidal Thoughts:  No  Homicidal Thoughts:  No  Memory:  Immediate;   Good  Judgement:   Good  Insight:  Good  Psychomotor Activity:  Normal  Concentration:  Concentration: Good and Attention Span: Good  Recall:  Good  Fund of Knowledge: Good  Language: Good  Akathisia:  No  Handed:  Right  AIMS (if indicated): not done  Assets:  Communication Skills Desire for Improvement  ADL's:  Intact  Cognition: WNL  Sleep:  Poor   Screenings: AUDIT    Flowsheet Row Office Visit from 04/01/2020 in [facility]  Alcohol Use Disorder Identification Test Final Score (AUDIT) 3      GAD-7    Flowsheet Row Office Visit from 04/01/2020 in [place]  Total GAD-7 Score 7      PHQ2-9    Flowsheet Row Video Visit from 10/20/2020 in [place] from 09/28/2020 in [place] Video Visit from 07/21/2020 in [place] Visit from 04/01/2020 in [place] Office Visit from 07/11/2017 in [facility]  PHQ-2 Total Score 0 '2 2 2 '$ 0  PHQ-9 Total Score -- '6 7 8 '$ --      Flowsheet Row Counselor from 09/28/2020 in [place] Video Visit from 07/21/2020 in [place] [facility] from 07/04/2020 in [facility] [place] No Risk No Risk No Risk        Assessment and Plan:  Megan Houston is a 30 y.o. year old female with a history of depression, anxiety , who presents for follow up appointment for below.   1. MDD (major depressive disorder), recurrent, in partial remission ([place]) 2. PMDD (premenstrual dysphoric disorder) She discontinued fluoxetine about 2 months ago due to concern of its risk on pregnancy.  Psychosocial stressors includes grief of the loss of her grandmother in [id number], and conflict with her mother.  She is not interested in starting psychotropics at this time, although she is receptive to restart the medication if she were to experience any worsening in mood  symptoms.  She has started to see a therapist, which she will greatly benefit from.  Provided psychoeducation regarding the benefit/risk of being on antidepressant during pregnancy. She agrees to contact the  office if any worsening in her mood symptoms to restart her medication.    Plan 1. No psychotropics is ordered (was on fluoxetine 30 mg daily) 2. Next appointment- as needed    The patient demonstrates the following risk factors for suicide: Chronic risk factors for suicide include: psychiatric disorder of depression, anxiety. Acute risk factors for suicide include: loss (financial, interpersonal, professional). Protective factors for this patient include: positive social support, coping skills and hope for the future. Considering these factors, the overall suicide risk at this point appears to be low. Patient is appropriate for outpatient follow up.        Norman Clay, MD 10/20/2020, 3:21 PM

## 2020-10-20 ENCOUNTER — Other Ambulatory Visit: Payer: Self-pay

## 2020-10-20 ENCOUNTER — Telehealth (INDEPENDENT_AMBULATORY_CARE_PROVIDER_SITE_OTHER): Payer: BC Managed Care – PPO | Admitting: Psychiatry

## 2020-10-20 ENCOUNTER — Encounter: Payer: Self-pay | Admitting: Psychiatry

## 2020-10-20 ENCOUNTER — Ambulatory Visit: Payer: BC Managed Care – PPO | Admitting: Licensed Clinical Social Worker

## 2020-10-20 DIAGNOSIS — F3281 Premenstrual dysphoric disorder: Secondary | ICD-10-CM

## 2020-10-20 DIAGNOSIS — F3341 Major depressive disorder, recurrent, in partial remission: Secondary | ICD-10-CM | POA: Diagnosis not present

## 2020-10-20 NOTE — Patient Instructions (Signed)
1. No psychotropics is ordered  2. Next appointment- as needed

## 2020-10-27 ENCOUNTER — Other Ambulatory Visit: Payer: Self-pay

## 2020-10-27 ENCOUNTER — Ambulatory Visit (INDEPENDENT_AMBULATORY_CARE_PROVIDER_SITE_OTHER): Payer: BC Managed Care – PPO | Admitting: Licensed Clinical Social Worker

## 2020-10-27 DIAGNOSIS — F419 Anxiety disorder, unspecified: Secondary | ICD-10-CM | POA: Diagnosis not present

## 2020-10-27 DIAGNOSIS — F3341 Major depressive disorder, recurrent, in partial remission: Secondary | ICD-10-CM

## 2020-10-27 NOTE — Plan of Care (Signed)
  Problem: Anxiety Disorder CCP Problem  1 Reduce overall frequency, intensity, and duration of the anxiety so that daily functioning is not impaired. Goal: STG: Patient will participate in at least 80% of scheduled individual psychotherapy sessions Outcome: Progressing   Problem: Anxiety Disorder CCP Problem  1 Reduce overall frequency, intensity, and duration of the anxiety so that daily functioning is not impaired. Intervention: Staff will work with patient to identify a minimum of 3 alternative coping behaviors to avoidance   Problem: Depression CCP Problem  1 Decrease depressive symptoms and improve levels of effective functioning  Goal: STG: '@PREFFIRSTNAME'$ @ will participate in at least 80% of scheduled individual psychotherapy sessions Outcome: Progressing   Problem: Depression CCP Problem  1 Decrease depressive symptoms and improve levels of effective functioning  Intervention: Review PLEASE Skills (Treat Physical Illness, Balance Eating, Avoid Mood-Altering Substances, Balance Sleep and Get Exercise) with '@PREFFIRSTNAME'$ @

## 2020-10-27 NOTE — Plan of Care (Signed)
  Problem: Depression CCP Problem  1 Decrease depressive symptoms and improve levels of effective functioning  Intervention: Review PLEASE Skills (Treat Physical Illness, Balance Eating, Avoid Mood-Altering Substances, Balance Sleep and Get Exercise) with '@PREFFIRSTNAME'$ @

## 2020-10-27 NOTE — Plan of Care (Signed)
Developed tx plan

## 2020-10-27 NOTE — Progress Notes (Signed)
Virtual Visit via Video Note  I connected with Megan Houston on 10/27/20 at  3:00 PM EDT by a video enabled telemedicine application and verified that I am speaking with the correct person using two identifiers.  Location: Patient: home Provider: [facility]   I discussed the limitations of evaluation and management by telemedicine and the availability of in person appointments. The patient expressed understanding and agreed to proceed.  I discussed the assessment and treatment plan with the patient. The patient was provided an opportunity to ask questions and all were answered. The patient agreed with the plan and demonstrated an understanding of the instructions.   The patient was advised to call back or seek an in-person evaluation if the symptoms worsen or if the condition fails to improve as anticipated.  I provided 45 minutes of non-face-to-face time during this encounter.   Megan Mccarey R Lovelace Cerveny, LCSW   THERAPIST PROGRESS NOTE  Session Time: 3-3:40p  Participation Level: Active  Behavioral Response: Neat and Well GroomedAlertAnxious  Type of Therapy: Individual Therapy  Treatment Goals addressed: Diagnosis: depression  Interventions: CBT, Motivational Interviewing, and Supportive  Summary: Megan Houston is a 30 y.o. female who presents with improving symptoms related to depression and anxiety symptoms. Pt reports that overall mood has been stable and that she is managing stress and anxiety well. Pt reporting good quality and quantity of sleep. Appetite WNL. Pt reports that she has concerns about her husband's drinking. Referred pt to AlAnon groups.  Encouraged pt to educate herself about supporting someone with alcohol addiction. Discussed importance of self care and safety. Continued recommendations are as follows: self care behaviors, positive social engagements, focusing on overall work/home/life balance, and focusing on positive physical and emotional wellness. .    Suicidal/Homicidal: NO  Therapist Response: Megan Houston is reporting a reduction in overall depression and anxiety symptoms. Megan Houston is seeking support for major life conflicts and is willing to take steps to help herself and family members. These behaviors are reflective of both personal growth and progress. Treatment to continue as indicated.   Plan: Return again in 4 weeks.  Diagnosis: Axis I: MDD, recurrent, partial remission; anxiety    Axis II: No diagnosis    [place], LCSW 10/27/2020

## 2020-11-14 ENCOUNTER — Other Ambulatory Visit: Payer: Self-pay | Admitting: Obstetrics & Gynecology

## 2020-11-14 DIAGNOSIS — Z3682 Encounter for antenatal screening for nuchal translucency: Secondary | ICD-10-CM

## 2020-11-15 ENCOUNTER — Ambulatory Visit (INDEPENDENT_AMBULATORY_CARE_PROVIDER_SITE_OTHER): Payer: BC Managed Care – PPO | Admitting: Women's Health

## 2020-11-15 ENCOUNTER — Other Ambulatory Visit: Payer: Self-pay

## 2020-11-15 ENCOUNTER — Encounter: Payer: Self-pay | Admitting: Women's Health

## 2020-11-15 ENCOUNTER — Ambulatory Visit: Payer: BC Managed Care – PPO | Admitting: *Deleted

## 2020-11-15 ENCOUNTER — Ambulatory Visit (INDEPENDENT_AMBULATORY_CARE_PROVIDER_SITE_OTHER): Payer: BC Managed Care – PPO

## 2020-11-15 VITALS — BP 126/87 | HR 83 | Wt 250.0 lb

## 2020-11-15 DIAGNOSIS — Z3401 Encounter for supervision of normal first pregnancy, first trimester: Secondary | ICD-10-CM

## 2020-11-15 DIAGNOSIS — Z3682 Encounter for antenatal screening for nuchal translucency: Secondary | ICD-10-CM | POA: Diagnosis not present

## 2020-11-15 DIAGNOSIS — Z6839 Body mass index (BMI) 39.0-39.9, adult: Secondary | ICD-10-CM | POA: Diagnosis not present

## 2020-11-15 DIAGNOSIS — Z23 Encounter for immunization: Secondary | ICD-10-CM | POA: Diagnosis not present

## 2020-11-15 DIAGNOSIS — Z3A12 12 weeks gestation of pregnancy: Secondary | ICD-10-CM | POA: Diagnosis not present

## 2020-11-15 DIAGNOSIS — Z3481 Encounter for supervision of other normal pregnancy, first trimester: Secondary | ICD-10-CM | POA: Diagnosis not present

## 2020-11-15 DIAGNOSIS — O0990 Supervision of high risk pregnancy, unspecified, unspecified trimester: Secondary | ICD-10-CM | POA: Insufficient documentation

## 2020-11-15 DIAGNOSIS — Z31430 Encounter of female for testing for genetic disease carrier status for procreative management: Secondary | ICD-10-CM | POA: Diagnosis not present

## 2020-11-15 DIAGNOSIS — Z3400 Encounter for supervision of normal first pregnancy, unspecified trimester: Secondary | ICD-10-CM | POA: Insufficient documentation

## 2020-11-15 LAB — POCT URINALYSIS DIPSTICK OB
Blood, UA: NEGATIVE
Glucose, UA: NEGATIVE
Ketones, UA: NEGATIVE
Leukocytes, UA: NEGATIVE
Nitrite, UA: NEGATIVE
POC,PROTEIN,UA: NEGATIVE

## 2020-11-15 MED ORDER — ASPIRIN 81 MG PO TBEC: 81.0000 mg | DELAYED_RELEASE_TABLET | Freq: Every day | ORAL | 3 refills | Status: DC

## 2020-11-15 NOTE — Patient Instructions (Signed)
Megan Houston, thank you for choosing our office today! We appreciate the opportunity to meet your healthcare needs. You may receive a short survey by mail, e-mail, or through [employer]. If you are happy with your care we would appreciate if you could take just a few minutes to complete the survey questions. We read all of your comments and take your feedback very seriously. Thank you again for choosing our office.  [facility] at [place] at [facility] ([place], [place] [id number]) Entrance C, located off of [address] parking   Nausea & Vomiting Have saltine crackers or pretzels by your bed and eat a few bites before you raise your head out of bed in the morning Eat small frequent meals throughout the day instead of large meals Drink plenty of fluids throughout the day to stay hydrated, just don't drink a lot of fluids with your meals.  This can make your stomach fill up faster making you feel sick Do not brush your teeth right after you eat Products with real ginger are good for nausea, like ginger ale and ginger hard candy Make sure it says made with real ginger! Sucking on sour candy like lemon heads is also good for nausea If your prenatal vitamins make you nauseated, take them at night so you will sleep through the nausea Sea Bands If you feel like you need medicine for the nausea & vomiting please let us know If you are unable to keep any fluids or food down please let us know   Constipation Drink plenty of fluid, preferably water, throughout the day Eat foods high in fiber such as fruits, vegetables, and grains Exercise, such as walking, is a good way to keep your bowels regular Drink warm fluids, especially warm prune juice, or decaf coffee Eat a 1/2 cup of real oatmeal (not instant), 1/2 cup applesauce, and 1/2-1 cup warm prune juice every day If needed, you may take Colace (docusate sodium) stool softener  once or twice a day to help keep the stool soft.  If you still are having problems with constipation, you may take Miralax once daily as needed to help keep your bowels regular.   Home Blood Pressure Monitoring for Patients   Your provider has recommended that you check your blood pressure (BP) at least once a week at home. If you do not have a blood pressure cuff at home, one will be provided for you. Contact your provider if you have not received your monitor within 1 week.   Helpful Tips for Accurate Home Blood Pressure Checks  Don't smoke, exercise, or drink caffeine 30 minutes before checking your BP Use the restroom before checking your BP (a full bladder can raise your pressure) Relax in a comfortable upright chair Feet on the ground Left arm resting comfortably on a flat surface at the level of your heart Legs uncrossed Back supported Sit quietly and don't talk Place the cuff on your bare arm Adjust snuggly, so that only two fingertips can fit between your skin and the top of the cuff Check 2 readings separated by at least one minute Keep a log of your BP readings For a visual, please reference this diagram: http://ccnc.care/bpdiagram  Provider Name: [facility]     Phone: [phone]  Zone 1: ALL CLEAR  Continue to monitor your symptoms:  BP reading is less than 140 (top number) or less than 90 (bottom  number)  No right upper stomach pain No headaches or seeing spots No feeling nauseated or throwing up No swelling in face and hands  Zone 2: CAUTION Call your doctor's office for any of the following:  BP reading is greater than 140 (top number) or greater than 90 (bottom number)  Stomach pain under your ribs in the middle or right side Headaches or seeing spots Feeling nauseated or throwing up Swelling in face and hands  Zone 3: EMERGENCY  Seek immediate medical care if you have any of the following:  BP reading is greater than160 (top number) or greater than  110 (bottom number) Severe headaches not improving with Tylenol Serious difficulty catching your breath Any worsening symptoms from Zone 2    First Trimester of Pregnancy The first trimester of pregnancy is from week 1 until the end of week 12 (months 1 through 3). A week after a sperm fertilizes an egg, the egg will implant on the wall of the uterus. This embryo will begin to develop into a baby. Genes from you and your partner are forming the baby. The female genes determine whether the baby is a boy or a girl. At 6-8 weeks, the eyes and face are formed, and the heartbeat can be seen on ultrasound. At the end of 12 weeks, all the baby's organs are formed.  Now that you are pregnant, you will want to do everything you can to have a healthy baby. Two of the most important things are to get good prenatal care and to follow your health care provider's instructions. Prenatal care is all the medical care you receive before the baby's birth. This care will help prevent, find, and treat any problems during the pregnancy and childbirth. BODY CHANGES Your body goes through many changes during pregnancy. The changes vary from woman to woman.  You may gain or lose a couple of pounds at first. You may feel sick to your stomach (nauseous) and throw up (vomit). If the vomiting is uncontrollable, call your health care provider. You may tire easily. You may develop headaches that can be relieved by medicines approved by your health care provider. You may urinate more often. Painful urination may mean you have a bladder infection. You may develop heartburn as a result of your pregnancy. You may develop constipation because certain hormones are causing the muscles that push waste through your intestines to slow down. You may develop hemorrhoids or swollen, bulging veins (varicose veins). Your breasts may begin to grow larger and become tender. Your nipples may stick out more, and the tissue that surrounds them  (areola) may become darker. Your gums may bleed and may be sensitive to brushing and flossing. Dark spots or blotches (chloasma, mask of pregnancy) may develop on your face. This will likely fade after the baby is born. Your menstrual periods will stop. You may have a loss of appetite. You may develop cravings for certain kinds of food. You may have changes in your emotions from day to day, such as being excited to be pregnant or being concerned that something may go wrong with the pregnancy and baby. You may have more vivid and strange dreams. You may have changes in your hair. These can include thickening of your hair, rapid growth, and changes in texture. Some women also have hair loss during or after pregnancy, or hair that feels dry or thin. Your hair will most likely return to normal after your baby is born. WHAT TO EXPECT AT YOUR PRENATAL  VISITS During a routine prenatal visit: You will be weighed to make sure you and the baby are growing normally. Your blood pressure will be taken. Your abdomen will be measured to track your baby's growth. The fetal heartbeat will be listened to starting around week 10 or 12 of your pregnancy. Test results from any previous visits will be discussed. Your health care provider may ask you: How you are feeling. If you are feeling the baby move. If you have had any abnormal symptoms, such as leaking fluid, bleeding, severe headaches, or abdominal cramping. If you have any questions. Other tests that may be performed during your first trimester include: Blood tests to find your blood type and to check for the presence of any previous infections. They will also be used to check for low iron levels (anemia) and Rh antibodies. Later in the pregnancy, blood tests for diabetes will be done along with other tests if problems develop. Urine tests to check for infections, diabetes, or protein in the urine. An ultrasound to confirm the proper growth and development  of the baby. An amniocentesis to check for possible genetic problems. Fetal screens for spina bifida and Down syndrome. You may need other tests to make sure you and the baby are doing well. HOME CARE INSTRUCTIONS  Medicines Follow your health care provider's instructions regarding medicine use. Specific medicines may be either safe or unsafe to take during pregnancy. Take your prenatal vitamins as directed. If you develop constipation, try taking a stool softener if your health care provider approves. Diet Eat regular, well-balanced meals. Choose a variety of foods, such as meat or vegetable-based protein, fish, milk and low-fat dairy products, vegetables, fruits, and whole grain breads and cereals. Your health care provider will help you determine the amount of weight gain that is right for you. Avoid raw meat and uncooked cheese. These carry germs that can cause birth defects in the baby. Eating four or five small meals rather than three large meals a day may help relieve nausea and vomiting. If you start to feel nauseous, eating a few soda crackers can be helpful. Drinking liquids between meals instead of during meals also seems to help nausea and vomiting. If you develop constipation, eat more high-fiber foods, such as fresh vegetables or fruit and whole grains. Drink enough fluids to keep your urine clear or pale yellow. Activity and Exercise Exercise only as directed by your health care provider. Exercising will help you: Control your weight. Stay in shape. Be prepared for labor and delivery. Experiencing pain or cramping in the lower abdomen or low back is a good sign that you should stop exercising. Check with your health care provider before continuing normal exercises. Try to avoid standing for long periods of time. Move your legs often if you must stand in one place for a long time. Avoid heavy lifting. Wear low-heeled shoes, and practice good posture. You may continue to have sex  unless your health care provider directs you otherwise. Relief of Pain or Discomfort Wear a good support bra for breast tenderness.   Take warm sitz baths to soothe any pain or discomfort caused by hemorrhoids. Use hemorrhoid cream if your health care provider approves.   Rest with your legs elevated if you have leg cramps or low back pain. If you develop varicose veins in your legs, wear support hose. Elevate your feet for 15 minutes, 3-4 times a day. Limit salt in your diet. Prenatal Care Schedule your prenatal visits by the  twelfth week of pregnancy. They are usually scheduled monthly at first, then more often in the last 2 months before delivery. Write down your questions. Take them to your prenatal visits. Keep all your prenatal visits as directed by your health care provider. Safety Wear your seat belt at all times when driving. Make a list of emergency phone numbers, including numbers for family, friends, the hospital, and police and fire departments. General Tips Ask your health care provider for a referral to a local prenatal education class. Begin classes no later than at the beginning of month 6 of your pregnancy. Ask for help if you have counseling or nutritional needs during pregnancy. Your health care provider can offer advice or refer you to specialists for help with various needs. Do not use hot tubs, steam rooms, or saunas. Do not douche or use tampons or scented sanitary pads. Do not cross your legs for long periods of time. Avoid cat litter boxes and soil used by cats. These carry germs that can cause birth defects in the baby and possibly loss of the fetus by miscarriage or stillbirth. Avoid all smoking, herbs, alcohol, and medicines not prescribed by your health care provider. Chemicals in these affect the formation and growth of the baby. Schedule a dentist appointment. At home, brush your teeth with a soft toothbrush and be gentle when you floss. SEEK MEDICAL CARE IF:   You have dizziness. You have mild pelvic cramps, pelvic pressure, or nagging pain in the abdominal area. You have persistent nausea, vomiting, or diarrhea. You have a bad smelling vaginal discharge. You have pain with urination. You notice increased swelling in your face, hands, legs, or ankles. SEEK IMMEDIATE MEDICAL CARE IF:  You have a fever. You are leaking fluid from your vagina. You have spotting or bleeding from your vagina. You have severe abdominal cramping or pain. You have rapid weight gain or loss. You vomit blood or material that looks like coffee grounds. You are exposed to [place] measles and have never had them. You are exposed to fifth disease or chickenpox. You develop a severe headache. You have shortness of breath. You have any kind of trauma, such as from a fall or a car accident. Document Released: 01/30/2001 Document Revised: 06/22/2013 Document Reviewed: 12/16/2012 [facility] Patient Information 2015 [place], [place]. This information is not intended to replace advice given to you by your health care provider. Make sure you discuss any questions you have with your health care provider.

## 2020-11-15 NOTE — Progress Notes (Signed)
[place] 12 wks,measurements c/w dates,CRL 60.36 mm,FHR 160 bpm,normal ovaries,anterior placenta,NB present,NT 1.3 mm

## 2020-11-15 NOTE — Progress Notes (Signed)
INITIAL OBSTETRICAL VISIT Patient name: Megan Houston MRN [id number]  Date of birth: 20-Oct-1990 Chief Complaint:   Initial Prenatal Visit  History of Present Illness:   Megan Houston is a 30 y.o. G62P0000 Caucasian female at [redacted]w[redacted]d by LMP c/w u/s at 7 weeks with an Estimated Date of Delivery: 05/30/21 being seen today for her initial obstetrical visit.   Patient's last menstrual period was 08/23/2020. Her obstetrical history is significant for primigravida.   Today she reports  mild n/v, declines meds .  Last pap 04/01/20. Results were: NILM w/ HRHPV negative  Depression screen [facility] 2/9 11/15/2020 04/01/2020 07/11/2017  Decreased Interest 0 1 0  Down, Depressed, Hopeless 0 1 0  PHQ - 2 Score 0 2 0  Altered sleeping 2 1 -  Tired, decreased energy 2 1 -  Change in appetite 0 1 -  Feeling bad or failure about yourself  0 1 -  Trouble concentrating 0 1 -  Moving slowly or fidgety/restless 0 1 -  Suicidal thoughts 0 0 -  PHQ-9 Score 4 8 -  Some encounter information is confidential and restricted. Go to Review Flowsheets activity to see all data.     GAD 7 : Generalized Anxiety Score 11/15/2020 04/01/2020  Nervous, Anxious, on Edge 0 1  Control/stop worrying 0 1  Worry too much - different things 0 1  Trouble relaxing 0 1  Restless 0 1  Easily annoyed or irritable 0 1  Afraid - awful might happen 0 1  Total GAD 7 Score 0 7     Review of Systems:   Pertinent items are noted in HPI Denies cramping/contractions, leakage of fluid, vaginal bleeding, abnormal vaginal discharge w/ itching/odor/irritation, headaches, visual changes, shortness of breath, chest pain, abdominal pain, severe nausea/vomiting, or problems with urination or bowel movements unless otherwise stated above.  Pertinent History Reviewed:  Reviewed past medical,surgical, social, obstetrical and family history.  Reviewed problem list, medications and allergies. OB History  Gravida Para Term Preterm AB Living  1 0  0 0 0 0  SAB IAB Ectopic Multiple Live Births  0 0 0 0 0    # Outcome Date GA Lbr Len/2nd Weight Sex Delivery Anes PTL Lv  1 Current            Physical Assessment:   Vitals:   11/15/20 1016  BP: 126/87  Pulse: 83  Weight: 250 lb (113.4 kg)  Body mass index is 41.6 kg/m.       Physical Examination:  General appearance - well appearing, and in no distress  Mental status - alert, oriented to person, place, and time  Psych:  She has a normal mood and affect  Skin - warm and dry, normal color, no suspicious lesions noted  Chest - effort normal, all lung fields clear to auscultation bilaterally  Heart - normal rate and regular rhythm  Abdomen - soft, nontender  Extremities:  No swelling or varicosities noted  Thin prep pap is not done   Chaperone: N/A    TODAY'S NT [place] 12 wks,measurements c/w dates,CRL 60.36 mm,FHR 160 bpm,normal ovaries,anterior placenta,NB present,NT 1.3 mm  Results for orders placed or performed in visit on 11/15/20 (from the past 24 hour(s))  POC Urinalysis Dipstick OB   Collection Time: 11/15/20 11:01 AM  Result Value Ref Range   Color, UA     Clarity, UA     Glucose, UA Negative Negative   Bilirubin, UA     Ketones, UA  neg    Spec Grav, UA     Blood, UA neg    pH, UA     POC,PROTEIN,UA Negative Negative, Trace, Small (1+), Moderate (2+), Large (3+), 4+   Urobilinogen, UA     Nitrite, UA neg    Leukocytes, UA Negative Negative   Appearance     Odor      Assessment & Plan:  1) Low-Risk Pregnancy G1P0000 at [redacted]w[redacted]d with an Estimated Date of Delivery: 05/30/21   2) Initial OB visit   Meds:  Meds ordered this encounter  Medications   aspirin 81 MG EC tablet    Sig: Take 1 tablet (81 mg total) by mouth daily. Swallow whole.    Dispense:  90 tablet    Refill:  3    Order Specific Question:   Supervising Provider    Answer:   Tania Ade H [[id number]]    Initial labs obtained Continue prenatal vitamins Reviewed n/v relief measures and warning  s/s to report Reviewed recommended weight gain based on pre-gravid BMI Encouraged well-balanced diet Genetic & carrier screening discussed: requests Panorama, NT/IT, and Horizon ,  Ultrasound discussed; fetal survey: requested CCNC completed> form faxed if has or is planning to apply for medicaid The nature of [place] for [employer] with multiple MDs and other Advanced Practice Providers was explained to patient; also emphasized that fellows, residents, and students are part of our team. Does have home bp cuff. Office bp cuff given: no. Rx sent: no. Check bp weekly, let us know if consistently >140/90.   Indications for ASA therapy (per uptodate) OR Two or more of the following: Nulliparity Yes Obesity (BMI>30 kg/m2) Yes  Indications for early A1C (per uptodate) BMI >=25 (>=23 in Asian women) AND one of the following First-degree relative with diabetes Yes  Follow-up: Return in about 4 weeks (around 12/13/2020) for LROB, CNM, in person.   Orders Placed This Encounter  Procedures   Urine Culture   GC/Chlamydia Probe Amp   Flu Vaccine QUAD 52mo+IM (Fluarix, Fluzone & Alfiuria Quad PF)   Integrated 1   Pain Management Screening Profile (10S)   Genetic Screening   CBC/D/Plt+RPR+Rh+ABO+RubIgG...   Hemoglobin A1c   POC Urinalysis Dipstick OB    Roma Schanz CNM, [facility] 11/15/2020 11:14 AM

## 2020-11-16 LAB — HEMOGLOBIN A1C
Est. average glucose Bld gHb Est-mCnc: 120 mg/dL
Hgb A1c MFr Bld: 5.8 % — ABNORMAL HIGH (ref 4.8–5.6)

## 2020-11-17 ENCOUNTER — Encounter: Payer: Self-pay | Admitting: Women's Health

## 2020-11-17 DIAGNOSIS — O09899 Supervision of other high risk pregnancies, unspecified trimester: Secondary | ICD-10-CM | POA: Insufficient documentation

## 2020-11-17 LAB — CBC/D/PLT+RPR+RH+ABO+RUBIGG...
Antibody Screen: NEGATIVE
Basophils Absolute: 0 10*3/uL (ref 0.0–0.2)
Basos: 0 %
EOS (ABSOLUTE): 0.1 10*3/uL (ref 0.0–0.4)
Eos: 1 %
HCV Ab: <0.1 s/co ratio (ref 0.0–0.9)
HIV Screen 4th Generation wRfx: NONREACTIVE
Hematocrit: 40 % (ref 34.0–46.6)
Hemoglobin: 13.3 g/dL (ref 11.1–15.9)
Hepatitis B Surface Ag: NEGATIVE
Immature Grans (Abs): 0.1 10*3/uL (ref 0.0–0.1)
Immature Granulocytes: 1 %
Lymphocytes Absolute: 2.2 10*3/uL (ref 0.7–3.1)
Lymphs: 16 %
MCH: 29.1 pg (ref 26.6–33.0)
MCHC: 33.3 g/dL (ref 31.5–35.7)
MCV: 88 fL (ref 79–97)
Monocytes Absolute: 1 10*3/uL — ABNORMAL HIGH (ref 0.1–0.9)
Monocytes: 7 %
Neutrophils Absolute: 10.6 10*3/uL — ABNORMAL HIGH (ref 1.4–7.0)
Neutrophils: 75 %
Platelets: 324 10*3/uL (ref 150–450)
RBC: 4.57 x10E6/uL (ref 3.77–5.28)
RDW: 13.2 % (ref 11.7–15.4)
RPR Ser Ql: NONREACTIVE
Rh Factor: POSITIVE
Rubella Antibodies, IGG: 0.91 index — ABNORMAL LOW (ref >0.99)
WBC: 14 10*3/uL — ABNORMAL HIGH (ref 3.4–10.8)

## 2020-11-17 LAB — PMP SCREEN PROFILE (10S), URINE
Amphetamine Scrn, Ur: NEGATIVE ng/mL
BARBITURATE SCREEN URINE: NEGATIVE ng/mL
BENZODIAZEPINE SCREEN, URINE: NEGATIVE ng/mL
CANNABINOIDS UR QL SCN: NEGATIVE ng/mL
Cocaine (Metab) Scrn, Ur: NEGATIVE ng/mL
Creatinine(Crt), U: 123.7 mg/dL (ref 20.0–300.0)
Methadone Screen, Urine: NEGATIVE ng/mL
OXYCODONE+OXYMORPHONE UR QL SCN: NEGATIVE ng/mL
Opiate Scrn, Ur: NEGATIVE ng/mL
Ph of Urine: 6.2 (ref 4.5–8.9)
Phencyclidine Qn, Ur: NEGATIVE ng/mL
Propoxyphene Scrn, Ur: NEGATIVE ng/mL

## 2020-11-17 LAB — INTEGRATED 1
Crown Rump Length: 60.4 mm
Gest. Age on Collection Date: 12.3 weeks
Maternal Age at EDD: 32.4 yr
Nuchal Translucency (NT): 1.3 mm
Number of Fetuses: 1
PAPP-A Value: 296.4 ng/mL
Weight: 250 [lb_av]

## 2020-11-17 LAB — HCV INTERPRETATION

## 2020-11-17 LAB — GC/CHLAMYDIA PROBE AMP
Chlamydia trachomatis, NAA: NEGATIVE
Neisseria Gonorrhoeae by PCR: NEGATIVE

## 2020-11-20 LAB — URINE CULTURE

## 2020-11-21 ENCOUNTER — Other Ambulatory Visit: Payer: Self-pay | Admitting: Women's Health

## 2020-11-21 ENCOUNTER — Encounter: Payer: Self-pay | Admitting: Women's Health

## 2020-11-21 DIAGNOSIS — R8271 Bacteriuria: Secondary | ICD-10-CM | POA: Insufficient documentation

## 2020-11-21 MED ORDER — PENICILLIN V POTASSIUM 500 MG PO TABS: 500.0000 mg | ORAL_TABLET | Freq: Four times a day (QID) | ORAL | 0 refills | Status: DC

## 2020-11-22 ENCOUNTER — Other Ambulatory Visit: Payer: BC Managed Care – PPO

## 2020-11-22 DIAGNOSIS — R7309 Other abnormal glucose: Secondary | ICD-10-CM | POA: Diagnosis not present

## 2020-11-23 LAB — GLUCOSE TOLERANCE, 2 HOURS W/ 1HR
Glucose, 1 hour: 110 mg/dL (ref 65–179)
Glucose, 2 hour: 115 mg/dL (ref 65–152)
Glucose, Fasting: 90 mg/dL (ref 65–91)

## 2020-11-28 ENCOUNTER — Encounter: Payer: Self-pay | Admitting: Women's Health

## 2020-11-29 ENCOUNTER — Ambulatory Visit: Payer: BC Managed Care – PPO | Admitting: Licensed Clinical Social Worker

## 2020-12-15 ENCOUNTER — Encounter: Payer: Self-pay | Admitting: Women's Health

## 2020-12-15 ENCOUNTER — Ambulatory Visit (INDEPENDENT_AMBULATORY_CARE_PROVIDER_SITE_OTHER): Payer: BC Managed Care – PPO | Admitting: Women's Health

## 2020-12-15 ENCOUNTER — Other Ambulatory Visit: Payer: Self-pay

## 2020-12-15 VITALS — BP 123/87 | HR 100 | Wt 251.5 lb

## 2020-12-15 DIAGNOSIS — Z363 Encounter for antenatal screening for malformations: Secondary | ICD-10-CM

## 2020-12-15 DIAGNOSIS — Z1379 Encounter for other screening for genetic and chromosomal anomalies: Secondary | ICD-10-CM | POA: Diagnosis not present

## 2020-12-15 DIAGNOSIS — R8271 Bacteriuria: Secondary | ICD-10-CM

## 2020-12-15 DIAGNOSIS — O2342 Unspecified infection of urinary tract in pregnancy, second trimester: Secondary | ICD-10-CM | POA: Diagnosis not present

## 2020-12-15 DIAGNOSIS — Z3402 Encounter for supervision of normal first pregnancy, second trimester: Secondary | ICD-10-CM

## 2020-12-15 NOTE — Patient Instructions (Addendum)
Megan Houston, thank you for choosing our office today! We appreciate the opportunity to meet your healthcare needs. You may receive a short survey by mail, e-mail, or through [employer]. If you are happy with your care we would appreciate if you could take just a few minutes to complete the survey questions. We read all of your comments and take your feedback very seriously. Thank you again for choosing our office.  Center for [employer] Team at [place] at [facility] ([place], [place] [address]) Entrance C, located off of [address] parking  Go to [email or web address] to register for FREE online childbirth classes  Tips to Help Leg Cramps Increase dietary sources of calcium (milk, yogurt, cheese, leafy greens, seafood, legumes, and fruit) and magnesium (dark leafy greens, nuts, seeds, fish, beans, whole grains, avocados, yogurt, bananas, dried fruit, dark chocolate) Spoonful of regular yellow mustard every night Pickle juice Magnesium supplement: 53mmol in the morning, 85mmol at night (can find in the vitamin aisle) Thiamine (Vit B1) 100mg  and pyridoxine (Vit B6) 40mg  daily for 2 weeks Dorsiflexion of foot: pointing your toes back towards your knee during the cramp      Call the office [phone]) or go to [facility] if: You begin to severe cramping Your water breaks.  Sometimes it is a big gush of fluid, sometimes it is just a trickle that keeps getting your panties wet or running down your legs You have vaginal bleeding.  It is normal to have a small amount of spotting if your cervix was checked.   [place] [facility] [place] Pediatricians/Family Doctors [facility]): [address] [place], [facility]: [address] [place], [phone]                [facility]): [place], [phone] (call to ask if accepting patients) [facility] Department: [address], [place], [place] Pediatricians/Family Doctors [facility]): [place]. [place], [address], [place] [facility]: [address] [place], [place] [facility]: [place], [place] [facility]): [phone] [facility]: [address], [place]: [address] [place] [facility]: [phone], [phone]  Home Blood Pressure Monitoring for Patients   Your provider has recommended that you check your blood pressure (BP) at least once a week at home. If you do not have a blood pressure cuff at home, one will be provided for you. Contact your provider if you have not received your monitor within 1 week.   Helpful Tips for Accurate Home Blood Pressure Checks  Don't smoke, exercise, or drink caffeine 30 minutes before checking your BP Use the restroom before checking your BP (a full bladder can raise your pressure) Relax in a comfortable upright chair Feet on the ground Left arm resting comfortably on a flat surface at the level of your heart Legs uncrossed Back supported Sit quietly and don't talk Place the cuff on your bare arm Adjust snuggly, so that only two fingertips can fit between your skin and the top of the cuff Check 2 readings separated by at least one minute Keep a log of your BP readings For a  visual, please reference this diagram: http://ccnc.care/bpdiagram  Provider Name: [facility]     Phone: [phone]  Zone 1: ALL CLEAR  Continue to monitor your symptoms:  BP reading is less than 140 (top number) or less than 90 (bottom number)  No right upper stomach pain No headaches or seeing spots No feeling nauseated or throwing up No swelling in face and hands  Zone 2:  CAUTION Call your doctor's office for any of the following:  BP reading is greater than 140 (top number) or greater than 90 (bottom number)  Stomach pain under your ribs in the middle or right side Headaches or seeing spots Feeling nauseated or throwing up Swelling in face and hands  Zone 3: EMERGENCY  Seek immediate medical care if you have any of the following:  BP reading is greater than160 (top number) or greater than 110 (bottom number) Severe headaches not improving with Tylenol Serious difficulty catching your breath Any worsening symptoms from Zone 2     Second Trimester of Pregnancy The second trimester is from week 14 through week 27 (months 4 through 6). The second trimester is often a time when you feel your best. Your body has adjusted to being pregnant, and you begin to feel better physically. Usually, morning sickness has lessened or quit completely, you may have more energy, and you may have an increase in appetite. The second trimester is also a time when the fetus is growing rapidly. At the end of the sixth month, the fetus is about 9 inches long and weighs about 1 pounds. You will likely begin to feel the baby move (quickening) between 16 and 20 weeks of pregnancy. Body changes during your second trimester Your body continues to go through many changes during your second trimester. The changes vary from woman to woman. Your weight will continue to increase. You will notice your lower abdomen bulging out. You may begin to get stretch marks on your hips, abdomen, and breasts. You may develop headaches that can be relieved by medicines. The medicines should be approved by your health care provider. You may urinate more often because the fetus is pressing on your bladder. You may develop or continue to have heartburn as a result of your pregnancy. You may develop constipation because certain hormones are causing the muscles that push waste through your intestines to slow  down. You may develop hemorrhoids or swollen, bulging veins (varicose veins). You may have back pain. This is caused by: Weight gain. Pregnancy hormones that are relaxing the joints in your pelvis. A shift in weight and the muscles that support your balance. Your breasts will continue to grow and they will continue to become tender. Your gums may bleed and may be sensitive to brushing and flossing. Dark spots or blotches (chloasma, mask of pregnancy) may develop on your face. This will likely fade after the baby is born. A dark line from your belly button to the pubic area (linea nigra) may appear. This will likely fade after the baby is born. You may have changes in your hair. These can include thickening of your hair, rapid growth, and changes in texture. Some women also have hair loss during or after pregnancy, or hair that feels dry or thin. Your hair will most likely return to normal after your baby is born.  What to expect at prenatal visits During a routine prenatal visit: You will be weighed to make sure you and the fetus are growing normally. Your blood pressure will  be taken. Your abdomen will be measured to track your baby's growth. The fetal heartbeat will be listened to. Any test results from the previous visit will be discussed.  Your health care provider may ask you: How you are feeling. If you are feeling the baby move. If you have had any abnormal symptoms, such as leaking fluid, bleeding, severe headaches, or abdominal cramping. If you are using any tobacco products, including cigarettes, chewing tobacco, and electronic cigarettes. If you have any questions.  Other tests that may be performed during your second trimester include: Blood tests that check for: Low iron levels (anemia). High blood sugar that affects pregnant women (gestational diabetes) between 34 and 28 weeks. Rh antibodies. This is to check for a protein on red blood cells (Rh factor). Urine tests to  check for infections, diabetes, or protein in the urine. An ultrasound to confirm the proper growth and development of the baby. An amniocentesis to check for possible genetic problems. Fetal screens for spina bifida and Down syndrome. HIV (human immunodeficiency virus) testing. Routine prenatal testing includes screening for HIV, unless you choose not to have this test.  Follow these instructions at home: Medicines Follow your health care provider's instructions regarding medicine use. Specific medicines may be either safe or unsafe to take during pregnancy. Take a prenatal vitamin that contains at least 600 micrograms (mcg) of folic acid. If you develop constipation, try taking a stool softener if your health care provider approves. Eating and drinking Eat a balanced diet that includes fresh fruits and vegetables, whole grains, good sources of protein such as meat, eggs, or tofu, and low-fat dairy. Your health care provider will help you determine the amount of weight gain that is right for you. Avoid raw meat and uncooked cheese. These carry germs that can cause birth defects in the baby. If you have low calcium intake from food, talk to your health care provider about whether you should take a daily calcium supplement. Limit foods that are high in fat and processed sugars, such as fried and sweet foods. To prevent constipation: Drink enough fluid to keep your urine clear or pale yellow. Eat foods that are high in fiber, such as fresh fruits and vegetables, whole grains, and beans. Activity Exercise only as directed by your health care provider. Most women can continue their usual exercise routine during pregnancy. Try to exercise for 30 minutes at least 5 days a week. Stop exercising if you experience uterine contractions. Avoid heavy lifting, wear low heel shoes, and practice good posture. A sexual relationship may be continued unless your health care provider directs you  otherwise. Relieving pain and discomfort Wear a good support bra to prevent discomfort from breast tenderness. Take warm sitz baths to soothe any pain or discomfort caused by hemorrhoids. Use hemorrhoid cream if your health care provider approves. Rest with your legs elevated if you have leg cramps or low back pain. If you develop varicose veins, wear support hose. Elevate your feet for 15 minutes, 3-4 times a day. Limit salt in your diet. Prenatal Care Write down your questions. Take them to your prenatal visits. Keep all your prenatal visits as told by your health care provider. This is important. Safety Wear your seat belt at all times when driving. Make a list of emergency phone numbers, including numbers for family, friends, the hospital, and police and fire departments. General instructions Ask your health care provider for a referral to a local prenatal education class. Begin classes no later  than the beginning of month 6 of your pregnancy. Ask for help if you have counseling or nutritional needs during pregnancy. Your health care provider can offer advice or refer you to specialists for help with various needs. Do not use hot tubs, steam rooms, or saunas. Do not douche or use tampons or scented sanitary pads. Do not cross your legs for long periods of time. Avoid cat litter boxes and soil used by cats. These carry germs that can cause birth defects in the baby and possibly loss of the fetus by miscarriage or stillbirth. Avoid all smoking, herbs, alcohol, and unprescribed drugs. Chemicals in these products can affect the formation and growth of the baby. Do not use any products that contain nicotine or tobacco, such as cigarettes and e-cigarettes. If you need help quitting, ask your health care provider. Visit your dentist if you have not gone yet during your pregnancy. Use a soft toothbrush to brush your teeth and be gentle when you floss. Contact a health care provider if: You have  dizziness. You have mild pelvic cramps, pelvic pressure, or nagging pain in the abdominal area. You have persistent nausea, vomiting, or diarrhea. You have a bad smelling vaginal discharge. You have pain when you urinate. Get help right away if: You have a fever. You are leaking fluid from your vagina. You have spotting or bleeding from your vagina. You have severe abdominal cramping or pain. You have rapid weight gain or weight loss. You have shortness of breath with chest pain. You notice sudden or extreme swelling of your face, hands, ankles, feet, or legs. You have not felt your baby move in over an hour. You have severe headaches that do not go away when you take medicine. You have vision changes. Summary The second trimester is from week 14 through week 27 (months 4 through 6). It is also a time when the fetus is growing rapidly. Your body goes through many changes during pregnancy. The changes vary from woman to woman. Avoid all smoking, herbs, alcohol, and unprescribed drugs. These chemicals affect the formation and growth your baby. Do not use any tobacco products, such as cigarettes, chewing tobacco, and e-cigarettes. If you need help quitting, ask your health care provider. Contact your health care provider if you have any questions. Keep all prenatal visits as told by your health care provider. This is important. This information is not intended to replace advice given to you by your health care provider. Make sure you discuss any questions you have with your health care provider. Document Released: 01/30/2001 Document Revised: 07/14/2015 Document Reviewed: 04/08/2012 Elsevier Interactive Patient Education  2017 [employer].

## 2020-12-15 NOTE — Progress Notes (Signed)
LOW-RISK PREGNANCY VISIT Patient name: Megan Houston MRN [id number]  Date of birth: 10/20/1990 Chief Complaint:   Routine Prenatal Visit (2nd IT)  History of Present Illness:   Megan Houston is a 30 y.o. G78P0000 female at [redacted]w[redacted]d with an Estimated Date of Delivery: 05/30/21 being seen today for ongoing management of a low-risk pregnancy.   Today she reports  odor inbetween labia minora/majora (CV swab for same in past neg), vulva swells after sex, cramps in legs, lightning crotch-discussed all complaints . Contractions: Not present. Vag. Bleeding: None.  Movement: Present. denies leaking of fluid.  Depression screen [facility] 2/9 11/15/2020 04/01/2020 07/11/2017  Decreased Interest 0 1 0  Down, Depressed, Hopeless 0 1 0  PHQ - 2 Score 0 2 0  Altered sleeping 2 1 -  Tired, decreased energy 2 1 -  Change in appetite 0 1 -  Feeling bad or failure about yourself  0 1 -  Trouble concentrating 0 1 -  Moving slowly or fidgety/restless 0 1 -  Suicidal thoughts 0 0 -  PHQ-9 Score 4 8 -  Some encounter information is confidential and restricted. Go to Review Flowsheets activity to see all data.     GAD 7 : Generalized Anxiety Score 11/15/2020 04/01/2020  Nervous, Anxious, on Edge 0 1  Control/stop worrying 0 1  Worry too much - different things 0 1  Trouble relaxing 0 1  Restless 0 1  Easily annoyed or irritable 0 1  Afraid - awful might happen 0 1  Total GAD 7 Score 0 7      Review of Systems:   Pertinent items are noted in HPI Denies abnormal vaginal discharge w/ itching/odor/irritation, headaches, visual changes, shortness of breath, chest pain, abdominal pain, severe nausea/vomiting, or problems with urination or bowel movements unless otherwise stated above. Pertinent History Reviewed:  Reviewed past medical,surgical, social, obstetrical and family history.  Reviewed problem list, medications and allergies. Physical Assessment:   Vitals:   12/15/20 0838  BP: 123/87  Pulse: 100   Weight: 251 lb 8 oz (114.1 kg)  Body mass index is 41.85 kg/m.        Physical Examination:   General appearance: Well appearing, and in no distress  Mental status: Alert, oriented to person, place, and time  Skin: Warm & dry  Cardiovascular: Normal heart rate noted  Respiratory: Normal respiratory effort, no distress  Abdomen: Soft, gravid, nontender  Pelvic: Cervical exam deferred         Extremities: Edema: Trace  Fetal Status: Fetal Heart Rate (bpm): 146   Movement: Present    Chaperone: N/A   No results found for this or any previous visit (from the past 24 hour(s)).  Assessment & Plan:  1) Low-risk pregnancy G1P0000 at [redacted]w[redacted]d with an Estimated Date of Delivery: 05/30/21   2) Leg cramps, gave printed prevention/relief measures   3) Recent asymptomatic bacteruria> urine cx poc today   Meds: No orders of the defined types were placed in this encounter.  Labs/procedures today: urine culture and 2nd IT  Plan:  Continue routine obstetrical care  Next visit: prefers will be in person for u/s     Reviewed: Preterm labor symptoms and general obstetric precautions including but not limited to vaginal bleeding, contractions, leaking of fluid and fetal movement were reviewed in detail with the patient.  All questions were answered. Does have home bp cuff. Office bp cuff given: not applicable. Check bp weekly, let us know if consistently >140  and/or >90.  Follow-up: Return in about 3 weeks (around 01/05/2021) for LROB, OR:VIFBPPH, CNM, in person.  Future Appointments  Date Time Provider [place]  01/17/2021  3:00 PM [facility] [place] [facility] None  01/17/2021  3:50 PM Roma Schanz, CNM [facility]    Orders Placed This Encounter  Procedures   Urine Culture   US OB Comp + 14 Wk   INTEGRATED 2   Roma Schanz CNM, [facility] 12/15/2020 9:05 AM

## 2020-12-17 LAB — INTEGRATED 2
AFP MoM: 1.56
Alpha-Fetoprotein: 35.8 ng/mL
Crown Rump Length: 60.4 mm
DIA MoM: 1.43
DIA Value: 167.4 pg/mL
Estriol, Unconjugated: 1.05 ng/mL
Gest. Age on Collection Date: 12.3 weeks
Gestational Age: 16.6 weeks
Maternal Age at EDD: 32.4 yr
Nuchal Translucency (NT): 1.3 mm
Nuchal Translucency MoM: 0.96
Number of Fetuses: 1
PAPP-A MoM: 0.63
PAPP-A Value: 296.4 ng/mL
Test Results:: NEGATIVE
Weight: 250 [lb_av]
Weight: 252 [lb_av]
hCG MoM: 1.89
hCG Value: 43.5 IU/mL
uE3 MoM: 1.15

## 2020-12-20 LAB — URINE CULTURE

## 2021-01-17 ENCOUNTER — Other Ambulatory Visit: Payer: BC Managed Care – PPO

## 2021-01-17 ENCOUNTER — Other Ambulatory Visit: Payer: Self-pay

## 2021-01-17 ENCOUNTER — Encounter: Payer: Self-pay | Admitting: Women's Health

## 2021-01-17 ENCOUNTER — Ambulatory Visit (INDEPENDENT_AMBULATORY_CARE_PROVIDER_SITE_OTHER): Payer: BC Managed Care – PPO

## 2021-01-17 ENCOUNTER — Ambulatory Visit (INDEPENDENT_AMBULATORY_CARE_PROVIDER_SITE_OTHER): Payer: BC Managed Care – PPO | Admitting: Women's Health

## 2021-01-17 ENCOUNTER — Encounter: Payer: BC Managed Care – PPO | Admitting: Women's Health

## 2021-01-17 VITALS — BP 139/80 | HR 68 | Wt 261.0 lb

## 2021-01-17 DIAGNOSIS — Z3402 Encounter for supervision of normal first pregnancy, second trimester: Secondary | ICD-10-CM

## 2021-01-17 DIAGNOSIS — Z363 Encounter for antenatal screening for malformations: Secondary | ICD-10-CM

## 2021-01-17 DIAGNOSIS — Z3A21 21 weeks gestation of pregnancy: Secondary | ICD-10-CM | POA: Diagnosis not present

## 2021-01-17 NOTE — Progress Notes (Signed)
[place] 21 wks,cephalic,anterior placenta gr 0,normal ovaries,cx 3.4 CM,SVP of fluid 5 cm,FHR 148 bpm,EFW 424 g 68%,anatomy complete,no obvious abnormalities

## 2021-01-17 NOTE — Progress Notes (Signed)
LOW-RISK PREGNANCY VISIT Patient name: Megan Houston MRN [id number]  Date of birth: [birth year] Chief Complaint:   Routine Prenatal Visit and Pregnancy Ultrasound  History of Present Illness:   Megan Houston is a 30 y.o. G18P0000 female at [redacted]w[redacted]d with an Estimated Date of Delivery: 05/30/21 being seen today for ongoing management of a low-risk pregnancy.   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 At times can't feel it. May be interested in [place]. Definitely thinking she doesn't want an epidural.  . Contractions: Not present. Vag. Bleeding: None.  Movement: Present. denies leaking of fluid.  Depression screen [facility] 2/9 11/15/2020 04/01/2020 07/11/2017  Decreased Interest 0 1 0  Down, Depressed, Hopeless 0 1 0  PHQ - 2 Score 0 2 0  Altered sleeping 2 1 -  Tired, decreased energy 2 1 -  Change in appetite 0 1 -  Feeling bad or failure about yourself  0 1 -  Trouble concentrating 0 1 -  Moving slowly or fidgety/restless 0 1 -  Suicidal thoughts 0 0 -  PHQ-9 Score 4 8 -  Some encounter information is confidential and restricted. Go to Review Flowsheets activity to see all data.     GAD 7 : Generalized Anxiety Score 11/15/2020 04/01/2020  Nervous, Anxious, on Edge 0 1  Control/stop worrying 0 1  Worry too much - different things 0 1  Trouble relaxing 0 1  Restless 0 1  Easily annoyed or irritable 0 1  Afraid - awful might happen 0 1  Total GAD 7 Score 0 7      Review of Systems:   Pertinent items are noted in HPI Denies abnormal vaginal discharge w/ itching/odor/irritation, headaches, visual changes, shortness of breath, chest pain, abdominal pain, severe nausea/vomiting, or problems with urination or bowel movements unless otherwise stated above. Pertinent History Reviewed:  Reviewed past medical,surgical, social, obstetrical and family history.  Reviewed problem list, medications and  allergies. Physical Assessment:   Vitals:   01/17/21 1559  BP: 139/80  Pulse: 68  Weight: 261 lb (118.4 kg)  Body mass index is 43.43 kg/m.        Physical Examination:   General appearance: Well appearing, and in no distress  Mental status: Alert, oriented to person, place, and time  Skin: Warm & dry  Cardiovascular: Normal heart rate noted  Respiratory: Normal respiratory effort, no distress  Abdomen: Soft, gravid, nontender, very small tender ?fat nodule Rt abdomen  Pelvic: Cervical exam deferred         Extremities: Edema: Trace  Fetal Status: Fetal Heart Rate (bpm): 148 u/s   Movement: Present  [place] 21 wks,cephalic,anterior placenta gr 0,normal ovaries,cx 3.4 cm,SVP of fluid 5 cm,FHR 148 bpm,EFW 424 g 68%,anatomy complete,no obvious abnormalities     Chaperone: N/A   No results found for this or any previous visit (from the past 24 hour(s)).  Assessment & Plan:  1) Low-risk pregnancy G1P0000 at [redacted]w[redacted]d with an Estimated Date of Delivery: 05/30/21   2) May be interested in waterbirth, discussed class  3) ?tender fat nodule Rt abdomen> let us know if grows/changes   Meds: No orders of the defined types were placed in this encounter.  Labs/procedures today: U/S  Plan:  Continue routine obstetrical care  Next visit: prefers in person    Reviewed: Preterm labor symptoms and general obstetric precautions including but not limited to vaginal  bleeding, contractions, leaking of fluid and fetal movement were reviewed in detail with the patient.  All questions were answered. Does have home bp cuff. Office bp cuff given: not applicable. Check bp weekly, let us know if consistently >140 and/or >90.  Follow-up: Return in about 4 weeks (around 02/14/2021) for [place], [place], in person.  No future appointments.  No orders of the defined types were placed in this encounter.  [place], [facility] 01/17/2021 4:16 PM

## 2021-01-17 NOTE — Patient Instructions (Signed)
Kinnie Feil, thank you for choosing our office today! We appreciate the opportunity to meet your healthcare needs. You may receive a short survey by mail, e-mail, or through [employer]. If you are happy with your care we would appreciate if you could take just a few minutes to complete the survey questions. We read all of your comments and take your feedback very seriously. Thank you again for choosing our office.  Center for [employer] Team at [place] at [facility] ([place], [place] [address]) Entrance C, located off of [place] parking  Go to [email or web address] to register for FREE online childbirth classes  Call the office [phone]) or go to [facility] if: You begin to severe cramping Your water breaks.  Sometimes it is a big gush of fluid, sometimes it is just a trickle that keeps getting your panties wet or running down your legs You have vaginal bleeding.  It is normal to have a small amount of spotting if your cervix was checked.   [facility] Pediatricians/Family Doctors [facility]): [address] [place], [facility]: [address] [place], [phone]                [place] [facility]): [place], [phone] (call to ask if accepting patients) [facility] Department: [address], [place], [place] Pediatricians/Family Doctors [facility]): [place]. [place], [address], [place] [facility]: [address] [place], [place] [facility] [place]: [place], [facility]): [phone] [facility]: [address], [place]: [address], [place] [facility]: [phone], [phone]  Home Blood Pressure Monitoring for Patients   Your provider has recommended that you check your blood pressure (BP) at least once a week at home. If you do not have a blood pressure cuff at home, one will be provided for you. Contact your provider if you have not received your monitor within 1 week.   Helpful Tips for Accurate Home Blood Pressure Checks  Don't smoke, exercise, or drink caffeine 30 minutes before checking your BP Use the restroom before checking your BP (a full bladder can raise your pressure) Relax in a comfortable upright chair Feet on the ground Left arm resting comfortably on a flat surface at the level of your heart Legs uncrossed Back supported Sit quietly and don't talk Place the cuff on your bare arm Adjust snuggly, so that only two fingertips can fit between your skin and the top of the cuff Check 2 readings separated by at least one minute Keep a log of your BP readings For a visual, please reference this diagram: http://ccnc.care/bpdiagram  Provider Name: [facility]     Phone: [phone]  Zone 1: ALL CLEAR  Continue to monitor your symptoms:  BP reading is less than 140 (top number) or less than 90 (bottom number)  No right upper stomach pain No headaches or seeing spots No feeling nauseated or throwing up No swelling in face and hands  Zone 2: CAUTION Call your doctor's office for any of the following:  BP reading is greater than 140 (top number) or greater than  90 (bottom number)  Stomach pain under your ribs in the middle or right side Headaches or seeing spots Feeling nauseated or throwing up Swelling in face and hands  Zone 3: EMERGENCY  Seek immediate medical care if you have any of the following:  BP reading is greater than160 (top number) or greater than 110 (bottom number) Severe headaches not improving with Tylenol Serious difficulty catching your breath Any worsening symptoms from  Zone 2     Second Trimester of Pregnancy The second trimester is from week 14 through week 27 (months 4 through 6). The second trimester is often a time when you feel your best. Your body has adjusted to being pregnant, and you begin to feel better physically. Usually, morning sickness has lessened or quit completely, you may have more energy, and you may have an increase in appetite. The second trimester is also a time when the fetus is growing rapidly. At the end of the sixth month, the fetus is about 9 inches long and weighs about 1 pounds. You will likely begin to feel the baby move (quickening) between 16 and 20 weeks of pregnancy. Body changes during your second trimester Your body continues to go through many changes during your second trimester. The changes vary from woman to woman. Your weight will continue to increase. You will notice your lower abdomen bulging out. You may begin to get stretch marks on your hips, abdomen, and breasts. You may develop headaches that can be relieved by medicines. The medicines should be approved by your health care provider. You may urinate more often because the fetus is pressing on your bladder. You may develop or continue to have heartburn as a result of your pregnancy. You may develop constipation because certain hormones are causing the muscles that push waste through your intestines to slow down. You may develop hemorrhoids or swollen, bulging veins (varicose veins). You may have back pain. This is caused by: Weight gain. Pregnancy hormones that are relaxing the joints in your pelvis. A shift in weight and the muscles that support your balance. Your breasts will continue to grow and they will continue to become tender. Your gums may bleed and may be sensitive to brushing and flossing. Dark spots or blotches (chloasma, mask of pregnancy) may develop on your face. This will likely fade after the baby is born. A dark line from your belly button to  the pubic area (linea nigra) may appear. This will likely fade after the baby is born. You may have changes in your hair. These can include thickening of your hair, rapid growth, and changes in texture. Some women also have hair loss during or after pregnancy, or hair that feels dry or thin. Your hair will most likely return to normal after your baby is born.  What to expect at prenatal visits During a routine prenatal visit: You will be weighed to make sure you and the fetus are growing normally. Your blood pressure will be taken. Your abdomen will be measured to track your baby's growth. The fetal heartbeat will be listened to. Any test results from the previous visit will be discussed.  Your health care provider may ask you: How you are feeling. If you are feeling the baby move. If you have had any abnormal symptoms, such as leaking fluid, bleeding, severe headaches, or abdominal cramping. If you are using any tobacco products, including cigarettes, chewing tobacco, and electronic cigarettes. If you have any questions.  Other tests that may be performed during   your second trimester include: Blood tests that check for: Low iron levels (anemia). High blood sugar that affects pregnant women (gestational diabetes) between 41 and 28 weeks. Rh antibodies. This is to check for a protein on red blood cells (Rh factor). Urine tests to check for infections, diabetes, or protein in the urine. An ultrasound to confirm the proper growth and development of the baby. An amniocentesis to check for possible genetic problems. Fetal screens for spina bifida and Down syndrome. HIV (human immunodeficiency virus) testing. Routine prenatal testing includes screening for HIV, unless you choose not to have this test.  Follow these instructions at home: Medicines Follow your health care provider's instructions regarding medicine use. Specific medicines may be either safe or unsafe to take during  pregnancy. Take a prenatal vitamin that contains at least 600 micrograms (mcg) of folic acid. If you develop constipation, try taking a stool softener if your health care provider approves. Eating and drinking Eat a balanced diet that includes fresh fruits and vegetables, whole grains, good sources of protein such as meat, eggs, or tofu, and low-fat dairy. Your health care provider will help you determine the amount of weight gain that is right for you. Avoid raw meat and uncooked cheese. These carry germs that can cause birth defects in the baby. If you have low calcium intake from food, talk to your health care provider about whether you should take a daily calcium supplement. Limit foods that are high in fat and processed sugars, such as fried and sweet foods. To prevent constipation: Drink enough fluid to keep your urine clear or pale yellow. Eat foods that are high in fiber, such as fresh fruits and vegetables, whole grains, and beans. Activity Exercise only as directed by your health care provider. Most women can continue their usual exercise routine during pregnancy. Try to exercise for 30 minutes at least 5 days a week. Stop exercising if you experience uterine contractions. Avoid heavy lifting, wear low heel shoes, and practice good posture. A sexual relationship may be continued unless your health care provider directs you otherwise. Relieving pain and discomfort Wear a good support bra to prevent discomfort from breast tenderness. Take warm sitz baths to soothe any pain or discomfort caused by hemorrhoids. Use hemorrhoid cream if your health care provider approves. Rest with your legs elevated if you have leg cramps or low back pain. If you develop varicose veins, wear support hose. Elevate your feet for 15 minutes, 3-4 times a day. Limit salt in your diet. Prenatal Care Write down your questions. Take them to your prenatal visits. Keep all your prenatal visits as told by your health  care provider. This is important. Safety Wear your seat belt at all times when driving. Make a list of emergency phone numbers, including numbers for family, friends, the hospital, and police and fire departments. General instructions Ask your health care provider for a referral to a local prenatal education class. Begin classes no later than the beginning of month 6 of your pregnancy. Ask for help if you have counseling or nutritional needs during pregnancy. Your health care provider can offer advice or refer you to specialists for help with various needs. Do not use hot tubs, steam rooms, or saunas. Do not douche or use tampons or scented sanitary pads. Do not cross your legs for long periods of time. Avoid cat litter boxes and soil used by cats. These carry germs that can cause birth defects in the baby and possibly loss of the  fetus by miscarriage or stillbirth. Avoid all smoking, herbs, alcohol, and unprescribed drugs. Chemicals in these products can affect the formation and growth of the baby. Do not use any products that contain nicotine or tobacco, such as cigarettes and e-cigarettes. If you need help quitting, ask your health care provider. Visit your dentist if you have not gone yet during your pregnancy. Use a soft toothbrush to brush your teeth and be gentle when you floss. Contact a health care provider if: You have dizziness. You have mild pelvic cramps, pelvic pressure, or nagging pain in the abdominal area. You have persistent nausea, vomiting, or diarrhea. You have a bad smelling vaginal discharge. You have pain when you urinate. Get help right away if: You have a fever. You are leaking fluid from your vagina. You have spotting or bleeding from your vagina. You have severe abdominal cramping or pain. You have rapid weight gain or weight loss. You have shortness of breath with chest pain. You notice sudden or extreme swelling of your face, hands, ankles, feet, or legs. You  have not felt your baby move in over an hour. You have severe headaches that do not go away when you take medicine. You have vision changes. Summary The second trimester is from week 14 through week 27 (months 4 through 6). It is also a time when the fetus is growing rapidly. Your body goes through many changes during pregnancy. The changes vary from woman to woman. Avoid all smoking, herbs, alcohol, and unprescribed drugs. These chemicals affect the formation and growth your baby. Do not use any tobacco products, such as cigarettes, chewing tobacco, and e-cigarettes. If you need help quitting, ask your health care provider. Contact your health care provider if you have any questions. Keep all prenatal visits as told by your health care provider. This is important. This information is not intended to replace advice given to you by your health care provider. Make sure you discuss any questions you have with your health care provider. Document Released: 01/30/2001 Document Revised: 07/14/2015 Document Reviewed: 04/08/2012 Elsevier Interactive Patient Education  2017 [employer].

## 2021-01-18 ENCOUNTER — Encounter: Payer: BC Managed Care – PPO | Admitting: Advanced Practice Midwife

## 2021-01-18 ENCOUNTER — Other Ambulatory Visit: Payer: BC Managed Care – PPO

## 2021-01-26 ENCOUNTER — Encounter: Payer: Self-pay | Admitting: Women's Health

## 2021-01-31 ENCOUNTER — Encounter: Payer: Self-pay | Admitting: Women's Health

## 2021-02-07 DIAGNOSIS — M5489 Other dorsalgia: Secondary | ICD-10-CM | POA: Diagnosis not present

## 2021-02-08 ENCOUNTER — Ambulatory Visit (INDEPENDENT_AMBULATORY_CARE_PROVIDER_SITE_OTHER): Payer: BC Managed Care – PPO | Admitting: Obstetrics & Gynecology

## 2021-02-08 ENCOUNTER — Encounter: Payer: Self-pay | Admitting: Obstetrics & Gynecology

## 2021-02-08 ENCOUNTER — Other Ambulatory Visit: Payer: Self-pay

## 2021-02-08 VITALS — BP 125/78 | HR 96 | Wt 267.0 lb

## 2021-02-08 DIAGNOSIS — R609 Edema, unspecified: Secondary | ICD-10-CM | POA: Diagnosis not present

## 2021-02-08 DIAGNOSIS — Z3402 Encounter for supervision of normal first pregnancy, second trimester: Secondary | ICD-10-CM

## 2021-02-08 NOTE — Progress Notes (Signed)
° °  LOW-RISK PREGNANCY VISIT Patient name: Megan [place] MRN [id number]  Date of birth: [birth year] Chief Complaint:   Routine Prenatal Visit  History of Present Illness:   Megan [place] is a 30 y.o. G22P0000 female at [redacted]w[redacted]d with an Estimated Date of Delivery: 05/30/21 being seen today for ongoing management of a low-risk pregnancy.   Depression screen [facility] 2/9 11/15/2020 04/01/2020 07/11/2017  Decreased Interest 0 1 0  Down, Depressed, Hopeless 0 1 0  PHQ - 2 Score 0 2 0  Altered sleeping 2 1 -  Tired, decreased energy 2 1 -  Change in appetite 0 1 -  Feeling bad or failure about yourself  0 1 -  Trouble concentrating 0 1 -  Moving slowly or fidgety/restless 0 1 -  Suicidal thoughts 0 0 -  PHQ-9 Score 4 8 -  Some encounter information is confidential and restricted. Go to Review Flowsheets activity to see all data.    Today she reports no complaints. Contractions: Not present.  .  Movement: Present. denies leaking of fluid. Review of Systems:   Pertinent items are noted in HPI Denies abnormal vaginal discharge w/ itching/odor/irritation, headaches, visual changes, shortness of breath, chest pain, abdominal pain, severe nausea/vomiting, or problems with urination or bowel movements unless otherwise stated above. Pertinent History Reviewed:  Reviewed past medical,surgical, social, obstetrical and family history.  Reviewed problem list, medications and allergies.  Physical Assessment:   Vitals:   02/08/21 1454  BP: 125/78  Pulse: 96  Weight: 267 lb (121.1 kg)  Body mass index is 44.43 kg/m.        Physical Examination:   General appearance: Well appearing, and in no distress  Mental status: Alert, oriented to person, place, and time  Skin: Warm & dry  Respiratory: Normal respiratory effort, no distress  Abdomen: Soft, gravid, nontender  Pelvic: Cervical exam deferred         Extremities: Edema: None  Psych:  mood and affect appropriate  Fetal Status: Fetal Heart Rate  (bpm): 145 Fundal Height: 24 cm Movement: Present    Chaperone: n/a    No results found for this or any previous visit (from the past 24 hour(s)).   Assessment & Plan:  1) Low-risk pregnancy G1P0000 at [redacted]w[redacted]d with an Estimated Date of Delivery: 05/30/21   -BMI 44- 6lb wt gain since last visit- discussed small meals and reviewed small changes that could make a big difference []  PN-2 next visit   Meds: No orders of the defined types were placed in this encounter.  Labs/procedures today: doppler  Plan:  Continue routine obstetrical care  Next visit: prefers in person    Reviewed: Preterm labor symptoms and general obstetric precautions including but not limited to vaginal bleeding, contractions, leaking of fluid and fetal movement were reviewed in detail with the patient.  All questions were answered. PT has home bp cuff. Check bp weekly, let us know if >140/90.   Follow-up: Return in about 4 weeks (around 03/08/2021) for [place] visit, PN-2.  No orders of the defined types were placed in this encounter.   Megan Pupa, DO Attending [place], [facility] for [employer], [place]

## 2021-02-15 ENCOUNTER — Encounter: Payer: BC Managed Care – PPO | Admitting: Advanced Practice Midwife

## 2021-02-24 ENCOUNTER — Encounter: Payer: Self-pay | Admitting: Women's Health

## 2021-03-08 ENCOUNTER — Other Ambulatory Visit: Payer: Self-pay

## 2021-03-08 ENCOUNTER — Ambulatory Visit (INDEPENDENT_AMBULATORY_CARE_PROVIDER_SITE_OTHER): Payer: BC Managed Care – PPO | Admitting: Advanced Practice Midwife

## 2021-03-08 ENCOUNTER — Other Ambulatory Visit: Payer: BC Managed Care – PPO

## 2021-03-08 ENCOUNTER — Other Ambulatory Visit (HOSPITAL_COMMUNITY)
Admission: RE | Admit: 2021-03-08 | Discharge: 2021-03-08 | Disposition: A | Discharge status: Home / Self Care | Payer: BC Managed Care – PPO | Source: Ambulatory Visit | Attending: Advanced Practice Midwife | Admitting: Advanced Practice Midwife

## 2021-03-08 VITALS — BP 134/84 | HR 104 | Wt 264.0 lb

## 2021-03-08 DIAGNOSIS — Z3A28 28 weeks gestation of pregnancy: Secondary | ICD-10-CM | POA: Diagnosis not present

## 2021-03-08 DIAGNOSIS — Z3403 Encounter for supervision of normal first pregnancy, third trimester: Secondary | ICD-10-CM | POA: Diagnosis not present

## 2021-03-08 DIAGNOSIS — Z131 Encounter for screening for diabetes mellitus: Secondary | ICD-10-CM

## 2021-03-08 DIAGNOSIS — O26893 Other specified pregnancy related conditions, third trimester: Secondary | ICD-10-CM | POA: Diagnosis not present

## 2021-03-08 DIAGNOSIS — N898 Other specified noninflammatory disorders of vagina: Secondary | ICD-10-CM | POA: Diagnosis not present

## 2021-03-08 NOTE — Patient Instructions (Signed)
Megan Houston, thank you for choosing our office today! We appreciate the opportunity to meet your healthcare needs. You may receive a short survey by mail, e-mail, or through [employer]. If you are happy with your care we would appreciate if you could take just a few minutes to complete the survey questions. We read all of your comments and take your feedback very seriously. Thank you again for choosing our office.  Center for [employer] Team at [place] at [facility] ([place], [place] [address]) Entrance C, located off of [place] parking   CLASSES: Go to [email or web address] to register for classes (childbirth, breastfeeding, waterbirth, infant CPR, daddy bootcamp, etc.)  Call the office [phone]) or go to [facility] if: You begin to have strong, frequent contractions Your water breaks.  Sometimes it is a big gush of fluid, sometimes it is just a trickle that keeps getting your panties wet or running down your legs You have vaginal bleeding.  It is normal to have a small amount of spotting if your cervix was checked.  You don't feel your baby moving like normal.  If you don't, get you something to eat and drink and lay down and focus on feeling your baby move.   If your baby is still not moving like normal, you should call the office or go to [facility].  Call the office [phone]) or go to [facility] hospital for these signs of pre-eclampsia: Severe headache that does not go away with Tylenol Visual changes- seeing spots, double, blurred vision Pain under your right breast or upper abdomen that does not go away with Tums or heartburn medicine Nausea and/or vomiting Severe swelling in your hands, feet, and face   Tdap Vaccine It is recommended that you get the Tdap vaccine during the third trimester of EACH pregnancy to help protect your baby from getting pertussis (whooping cough) 27-36 weeks is the BEST time to do  this so that you can pass the protection on to your baby. During pregnancy is better than after pregnancy, but if you are unable to get it during pregnancy it will be offered at the hospital.  You can get this vaccine with [place], at the health department, your family doctor, or some local pharmacies Everyone who will be around your baby should also be up-to-date on their vaccines before the baby comes. Adults (who are not pregnant) only need 1 dose of Tdap during adulthood.   [facility] Pediatricians/Family Doctors [facility]): [address] [place], [facility]: [address] [place], [phone]                [facility]): [place], [phone] (call to ask if accepting patients) [facility] Department: [address], [place], [place] Pediatricians/Family Doctors [facility]): [place] [place], [address], [facility]: [address] [place], [place] [facility]: [place], [place] [facility]): [phone] [facility]: [address], [place]: [address], [facility]: [phone], [phone]  Home Blood Pressure Monitoring for Patients   Your provider has recommended that you check your  blood pressure (BP) at least once a week at home. If you do not have a blood pressure cuff at home, one will be provided for you. Contact your provider if you have not received your monitor within 1 week.   Helpful Tips for Accurate Home Blood Pressure Checks  Don't smoke, exercise, or drink caffeine 30 minutes before checking your BP Use the restroom before checking your BP (a full bladder can raise your  pressure) Relax in a comfortable upright chair Feet on the ground Left arm resting comfortably on a flat surface at the level of your heart Legs uncrossed Back supported Sit quietly and don't talk Place the cuff on your bare arm Adjust snuggly, so that only two fingertips can fit between your skin and the top of the cuff Check 2 readings separated by at least one minute Keep a log of your BP readings For a visual, please reference this diagram: http://ccnc.care/bpdiagram  Provider Name: [facility]     Phone: [phone]  Zone 1: ALL CLEAR  Continue to monitor your symptoms:  BP reading is less than 140 (top number) or less than 90 (bottom number)  No right upper stomach pain No headaches or seeing spots No feeling nauseated or throwing up No swelling in face and hands  Zone 2: CAUTION Call your doctor's office for any of the following:  BP reading is greater than 140 (top number) or greater than 90 (bottom number)  Stomach pain under your ribs in the middle or right side Headaches or seeing spots Feeling nauseated or throwing up Swelling in face and hands  Zone 3: EMERGENCY  Seek immediate medical care if you have any of the following:  BP reading is greater than160 (top number) or greater than 110 (bottom number) Severe headaches not improving with Tylenol Serious difficulty catching your breath Any worsening symptoms from Zone 2   Third Trimester of Pregnancy The third trimester is from week 29 through week 42, months 7 through 9. The third trimester is a time when the fetus is growing rapidly. At the end of the ninth month, the fetus is about 20 inches in length and weighs 6-10 pounds.  BODY CHANGES Your body goes through many changes during pregnancy. The changes vary from woman to woman.  Your weight will continue to increase. You can expect to gain 25-35 pounds (11-16 kg) by the end of the pregnancy. You may begin to get stretch marks on your hips, abdomen,  and breasts. You may urinate more often because the fetus is moving lower into your pelvis and pressing on your bladder. You may develop or continue to have heartburn as a result of your pregnancy. You may develop constipation because certain hormones are causing the muscles that push waste through your intestines to slow down. You may develop hemorrhoids or swollen, bulging veins (varicose veins). You may have pelvic pain because of the weight gain and pregnancy hormones relaxing your joints between the bones in your pelvis. Backaches may result from overexertion of the muscles supporting your posture. You may have changes in your hair. These can include thickening of your hair, rapid growth, and changes in texture. Some women also have hair loss during or after pregnancy, or hair that feels dry or thin. Your hair will most likely return to normal after your baby is born. Your breasts will continue to grow and be tender. A yellow discharge may leak from your breasts called colostrum. Your belly button may stick out. You may  feel short of breath because of your expanding uterus. You may notice the fetus "dropping," or moving lower in your abdomen. You may have a bloody mucus discharge. This usually occurs a few days to a week before labor begins. Your cervix becomes thin and soft (effaced) near your due date. WHAT TO EXPECT AT YOUR PRENATAL EXAMS  You will have prenatal exams every 2 weeks until week 36. Then, you will have weekly prenatal exams. During a routine prenatal visit: You will be weighed to make sure you and the fetus are growing normally. Your blood pressure is taken. Your abdomen will be measured to track your baby's growth. The fetal heartbeat will be listened to. Any test results from the previous visit will be discussed. You may have a cervical check near your due date to see if you have effaced. At around 36 weeks, your caregiver will check your cervix. At the same time, your  caregiver will also perform a test on the secretions of the vaginal tissue. This test is to determine if a type of bacteria, Group B streptococcus, is present. Your caregiver will explain this further. Your caregiver may ask you: What your birth plan is. How you are feeling. If you are feeling the baby move. If you have had any abnormal symptoms, such as leaking fluid, bleeding, severe headaches, or abdominal cramping. If you have any questions. Other tests or screenings that may be performed during your third trimester include: Blood tests that check for low iron levels (anemia). Fetal testing to check the health, activity level, and growth of the fetus. Testing is done if you have certain medical conditions or if there are problems during the pregnancy. FALSE LABOR You may feel small, irregular contractions that eventually go away. These are called Braxton Hicks contractions, or false labor. Contractions may last for hours, days, or even weeks before true labor sets in. If contractions come at regular intervals, intensify, or become painful, it is best to be seen by your caregiver.  SIGNS OF LABOR  Menstrual-like cramps. Contractions that are 5 minutes apart or less. Contractions that start on the top of the uterus and spread down to the lower abdomen and back. A sense of increased pelvic pressure or back pain. A watery or bloody mucus discharge that comes from the vagina. If you have any of these signs before the 37th week of pregnancy, call your caregiver right away. You need to go to the hospital to get checked immediately. HOME CARE INSTRUCTIONS  Avoid all smoking, herbs, alcohol, and unprescribed drugs. These chemicals affect the formation and growth of the baby. Follow your caregiver's instructions regarding medicine use. There are medicines that are either safe or unsafe to take during pregnancy. Exercise only as directed by your caregiver. Experiencing uterine cramps is a good sign to  stop exercising. Continue to eat regular, healthy meals. Wear a good support bra for breast tenderness. Do not use hot tubs, steam rooms, or saunas. Wear your seat belt at all times when driving. Avoid raw meat, uncooked cheese, cat litter boxes, and soil used by cats. These carry germs that can cause birth defects in the baby. Take your prenatal vitamins. Try taking a stool softener (if your caregiver approves) if you develop constipation. Eat more high-fiber foods, such as fresh vegetables or fruit and whole grains. Drink plenty of fluids to keep your urine clear or pale yellow. Take warm sitz baths to soothe any pain or discomfort caused by hemorrhoids. Use hemorrhoid cream if  your caregiver approves. If you develop varicose veins, wear support hose. Elevate your feet for 15 minutes, 3-4 times a day. Limit salt in your diet. Avoid heavy lifting, wear low heal shoes, and practice good posture. Rest a lot with your legs elevated if you have leg cramps or low back pain. Visit your dentist if you have not gone during your pregnancy. Use a soft toothbrush to brush your teeth and be gentle when you floss. A sexual relationship may be continued unless your caregiver directs you otherwise. Do not travel far distances unless it is absolutely necessary and only with the approval of your caregiver. Take prenatal classes to understand, practice, and ask questions about the labor and delivery. Make a trial run to the hospital. Pack your hospital bag. Prepare the baby's nursery. Continue to go to all your prenatal visits as directed by your caregiver. SEEK MEDICAL CARE IF: You are unsure if you are in labor or if your water has broken. You have dizziness. You have mild pelvic cramps, pelvic pressure, or nagging pain in your abdominal area. You have persistent nausea, vomiting, or diarrhea. You have a bad smelling vaginal discharge. You have pain with urination. SEEK IMMEDIATE MEDICAL CARE IF:  You  have a fever. You are leaking fluid from your vagina. You have spotting or bleeding from your vagina. You have severe abdominal cramping or pain. You have rapid weight loss or gain. You have shortness of breath with chest pain. You notice sudden or extreme swelling of your face, hands, ankles, feet, or legs. You have not felt your baby move in over an hour. You have severe headaches that do not go away with medicine. You have vision changes. Document Released: 01/30/2001 Document Revised: 02/10/2013 Document Reviewed: 04/08/2012 [facility] Patient Information 2015 [place], [place]. This information is not intended to replace advice given to you by your health care provider. Make sure you discuss any questions you have with your health care provider.

## 2021-03-08 NOTE — Addendum Note (Signed)
Addended by: Dorita Sciara, Aaren Atallah A on: 03/08/2021 09:31 AM   Modules accepted: Orders

## 2021-03-08 NOTE — Progress Notes (Addendum)
° °  LOW-RISK PREGNANCY VISIT Patient name: Megan Houston MRN [id number]  Date of birth: 08-11-1990 Chief Complaint:   Routine Prenatal Visit and PN2  History of Present Illness:   Megan Houston is a 31 y.o. G25P0000 female at [redacted]w[redacted]d with an Estimated Date of Delivery: 05/30/21 being seen today for ongoing management of a low-risk pregnancy.  Today she reports  doing well; interested in hydrotherapy but not birthing in the tub most likely; also reports odor between labia x 4 mos but not necessarily a vag d/c . Contractions: Not present. Vag. Bleeding: None.  Movement: Present. denies leaking of fluid. Review of Systems:   Pertinent items are noted in HPI Denies abnormal vaginal discharge w/ itching/odor/irritation, headaches, visual changes, shortness of breath, chest pain, abdominal pain, severe nausea/vomiting, or problems with urination or bowel movements unless otherwise stated above. Pertinent History Reviewed:  Reviewed past medical,surgical, social, obstetrical and family history.  Reviewed problem list, medications and allergies. Physical Assessment:   Vitals:   03/08/21 0848  BP: 134/84  Pulse: (!) 104  Weight: 264 lb (119.7 kg)  Body mass index is 43.93 kg/m.        Physical Examination:   General appearance: Well appearing, and in no distress  Mental status: Alert, oriented to person, place, and time  Skin: Warm & dry  Cardiovascular: Normal heart rate noted  Respiratory: Normal respiratory effort, no distress  Abdomen: Soft, gravid, nontender  Pelvic: Cervical exam deferred         Extremities: Edema: Trace  Fetal Status: Fetal Heart Rate (bpm): 143 Fundal Height: 28 cm Movement: Present    No results found for this or any previous visit (from the past 24 hour(s)).  Assessment & Plan:  1) Low-risk pregnancy G1P0000 at [redacted]w[redacted]d with an Estimated Date of Delivery: 05/30/21   2) Interested in hydrotherapy for labor, probably doesn't want to birth in tub; has class  tomorrow; will get consent @ 36wks; reviewed conditions that would risk her out  3) Vag odor, send CV swab   Meds: No orders of the defined types were placed in this encounter.  Labs/procedures today: PN2, CV swab, Tdap info given  Plan:  Continue routine obstetrical care   Reviewed: Preterm labor symptoms and general obstetric precautions including but not limited to vaginal bleeding, contractions, leaking of fluid and fetal movement were reviewed in detail with the patient.  All questions were answered. Has home bp cuff. Check bp weekly, let us know if >140/90.   Follow-up: Return in about 2 weeks (around 03/22/2021) for [place], in person.  No orders of the defined types were placed in this encounter.  Myrtis Ser CNM 03/08/2021 9:34 AM

## 2021-03-09 ENCOUNTER — Encounter: Payer: Self-pay | Admitting: Women's Health

## 2021-03-09 ENCOUNTER — Other Ambulatory Visit: Payer: Self-pay | Admitting: Women's Health

## 2021-03-09 LAB — CBC
Hematocrit: 36.2 % (ref 34.0–46.6)
Hemoglobin: 11.8 g/dL (ref 11.1–15.9)
MCH: 28.2 pg (ref 26.6–33.0)
MCHC: 32.6 g/dL (ref 31.5–35.7)
MCV: 87 fL (ref 79–97)
Platelets: 326 10*3/uL (ref 150–450)
RBC: 4.18 x10E6/uL (ref 3.77–5.28)
RDW: 13.1 % (ref 11.7–15.4)
WBC: 18.6 10*3/uL — ABNORMAL HIGH (ref 3.4–10.8)

## 2021-03-09 LAB — ANTIBODY SCREEN: Antibody Screen: NEGATIVE

## 2021-03-09 LAB — CERVICOVAGINAL ANCILLARY ONLY
Bacterial Vaginitis (gardnerella): NEGATIVE
Comment: NEGATIVE

## 2021-03-09 LAB — HIV ANTIBODY (ROUTINE TESTING W REFLEX): HIV Screen 4th Generation wRfx: NONREACTIVE

## 2021-03-09 LAB — GLUCOSE TOLERANCE, 2 HOURS W/ 1HR
Glucose, 1 hour: 126 mg/dL (ref 70–179)
Glucose, 2 hour: 118 mg/dL (ref 70–152)
Glucose, Fasting: 84 mg/dL (ref 70–91)

## 2021-03-09 LAB — RPR: RPR Ser Ql: NONREACTIVE

## 2021-03-09 MED ORDER — METRONIDAZOLE 0.75 % VA GEL: 1.0000 | Freq: Every day | VAGINAL | 0 refills | Status: DC

## 2021-03-10 ENCOUNTER — Encounter: Payer: Self-pay | Admitting: Women's Health

## 2021-03-22 ENCOUNTER — Ambulatory Visit (INDEPENDENT_AMBULATORY_CARE_PROVIDER_SITE_OTHER): Payer: BC Managed Care – PPO | Admitting: Women's Health

## 2021-03-22 ENCOUNTER — Encounter: Payer: Self-pay | Admitting: Women's Health

## 2021-03-22 ENCOUNTER — Other Ambulatory Visit: Payer: Self-pay

## 2021-03-22 VITALS — BP 134/82 | HR 112 | Wt 269.4 lb

## 2021-03-22 DIAGNOSIS — Z23 Encounter for immunization: Secondary | ICD-10-CM | POA: Diagnosis not present

## 2021-03-22 DIAGNOSIS — R030 Elevated blood-pressure reading, without diagnosis of hypertension: Secondary | ICD-10-CM

## 2021-03-22 DIAGNOSIS — O0993 Supervision of high risk pregnancy, unspecified, third trimester: Secondary | ICD-10-CM

## 2021-03-22 DIAGNOSIS — Z3403 Encounter for supervision of normal first pregnancy, third trimester: Secondary | ICD-10-CM

## 2021-03-22 LAB — POCT URINALYSIS DIPSTICK OB
Blood, UA: NEGATIVE
Glucose, UA: NEGATIVE
Ketones, UA: NEGATIVE
Leukocytes, UA: NEGATIVE
Nitrite, UA: NEGATIVE
POC,PROTEIN,UA: NEGATIVE

## 2021-03-22 NOTE — Progress Notes (Signed)
LOW-RISK PREGNANCY VISIT Patient name: Megan Houston MRN [id number]  Date of birth: 1990/05/19 Chief Complaint:   Routine Prenatal Visit  History of Present Illness:   Megan Houston is a 31 y.o. G10P0000 female at [redacted]w[redacted]d with an Estimated Date of Delivery: 05/30/21 being seen today for ongoing management of a low-risk pregnancy.   Today she reports  was having d/c the other day and was curious, so felt inside vagina, felt like her cx was very low and soft and could put a fingertip through it . Had headache yesterday, bp 120s/80s. Has not had any elevated at home, has wrist cuff. Denies visual changes, ruq/epigastric pain, n/v. Bad heartburn, pepcid isn't helping anymore.   Contractions: Not present. Vag. Bleeding: None.  Movement: Present. denies leaking of fluid.  Depression screen [facility] 2/9 03/08/2021 11/15/2020 04/01/2020 07/11/2017  Decreased Interest 0 0 1 0  Down, Depressed, Hopeless 0 0 1 0  PHQ - 2 Score 0 0 2 0  Altered sleeping 2 2 1  -  Tired, decreased energy 3 2 1  -  Change in appetite 0 0 1 -  Feeling bad or failure about yourself  0 0 1 -  Trouble concentrating 0 0 1 -  Moving slowly or fidgety/restless 0 0 1 -  Suicidal thoughts 0 0 0 -  PHQ-9 Score 5 4 8  -  Some encounter information is confidential and restricted. Go to Review Flowsheets activity to see all data.     GAD 7 : Generalized Anxiety Score 03/08/2021 11/15/2020 04/01/2020  Nervous, Anxious, on Edge 0 0 1  Control/stop worrying 0 0 1  Worry too much - different things 0 0 1  Trouble relaxing 0 0 1  Restless 0 0 1  Easily annoyed or irritable 0 0 1  Afraid - awful might happen 0 0 1  Total GAD 7 Score 0 0 7      Review of Systems:   Pertinent items are noted in HPI Denies abnormal vaginal discharge w/ itching/odor/irritation, headaches, visual changes, shortness of breath, chest pain, abdominal pain, severe nausea/vomiting, or problems with urination or bowel movements unless otherwise stated  above. Pertinent History Reviewed:  Reviewed past medical,surgical, social, obstetrical and family history.  Reviewed problem list, medications and allergies. Physical Assessment:   Vitals:   03/22/21 0901 03/22/21 0906  BP: (!) 146/84 134/82  Pulse: (!) 115 (!) 112  Weight: 269 lb 6.4 oz (122.2 kg)   Body mass index is 44.83 kg/m.        Physical Examination:   General appearance: Well appearing, and in no distress  Mental status: Alert, oriented to person, place, and time  Skin: Warm & dry  Cardiovascular: Normal heart rate noted  Respiratory: Normal respiratory effort, no distress  Abdomen: Soft, gravid, nontender  Pelvic:  spec exam: cx visually long/closed, SVE: LTC   Dilation: Closed Effacement (%): Thick Station: Ballotable  Extremities: Edema: Trace  Fetal Status: Fetal Heart Rate (bpm): 151 Fundal Height: 29 cm Movement: Present    Chaperone: Angel Neas   No results found for this or any previous visit (from the past 24 hour(s)).  Assessment & Plan:  1) Low-risk pregnancy G1P0000 at [redacted]w[redacted]d with an Estimated Date of Delivery: 05/30/21   2) Initially elevated bp> borderline on repeat, mild headache yesterday, no other sx, no proteinuria. Check bp's bid at home, f/u 2d for bp check w/ nurse. Reviewed pre-e s/s, reasons to seek care   Meds: No orders of the  defined types were placed in this encounter.  Labs/procedures today: spec exam and SVE  Plan:  Continue routine obstetrical care  Next visit: prefers in person    Reviewed: Preterm labor symptoms and general obstetric precautions including but not limited to vaginal bleeding, contractions, leaking of fluid and fetal movement were reviewed in detail with the patient.  All questions were answered. Does have home bp cuff. Office bp cuff given: not applicable. Check bp twice daily, let us know if consistently >150 and/or >95.  Follow-up: Return in about 2 days (around 03/24/2021) for nurse bp check.  Future Appointments   Date Time Provider [place]  03/24/2021  9:10 AM [facility] NURSE [facility]    Orders Placed This Encounter  Procedures   Tdap vaccine greater than or equal to 7yo IM   POC Urinalysis Dipstick OB   Roma Schanz CNM, [facility] 03/22/2021 10:08 AM

## 2021-03-22 NOTE — Patient Instructions (Signed)
Megan Houston, thank you for choosing our office today! We appreciate the opportunity to meet your healthcare needs. You may receive a short survey by mail, e-mail, or through [employer]. If you are happy with your care we would appreciate if you could take just a few minutes to complete the survey questions. We read all of your comments and take your feedback very seriously. Thank you again for choosing our office.  Center for [employer] Team at [place] at [facility] ([place], [place] [address]) Entrance C, located off of [place] parking   CLASSES: Go to [email or web address] to register for classes (childbirth, breastfeeding, waterbirth, infant CPR, daddy bootcamp, etc.)  Call the office [phone]) or go to [facility] [place] if: You begin to have strong, frequent contractions Your water breaks.  Sometimes it is a big gush of fluid, sometimes it is just a trickle that keeps getting your panties wet or running down your legs You have vaginal bleeding.  It is normal to have a small amount of spotting if your cervix was checked.  You don't feel your baby moving like normal.  If you don't, get you something to eat and drink and lay down and focus on feeling your baby move.   If your baby is still not moving like normal, you should call the office or go to [facility].  Call the office [phone]) or go to [facility] hospital for these signs of pre-eclampsia: Severe headache that does not go away with Tylenol Visual changes- seeing spots, double, blurred vision Pain under your right breast or upper abdomen that does not go away with Tums or heartburn medicine Nausea and/or vomiting Severe swelling in your hands, feet, and face   Tdap Vaccine It is recommended that you get the Tdap vaccine during the third trimester of EACH pregnancy to help protect your baby from getting pertussis (whooping cough) 27-36 weeks is the BEST time to do  this so that you can pass the protection on to your baby. During pregnancy is better than after pregnancy, but if you are unable to get it during pregnancy it will be offered at the hospital.  You can get this vaccine with [place], at the health department, your family doctor, or some local pharmacies Everyone who will be around your baby should also be up-to-date on their vaccines before the baby comes. Adults (who are not pregnant) only need 1 dose of Tdap during adulthood.   [facility] Pediatricians/Family Doctors [facility]): [address] [place], [facility]: [address] [place], [phone]                [facility]): [place], [phone] (call to ask if accepting patients) [facility] Department: [address], [place], [place] Pediatricians/Family Doctors [facility]): [place]. [place], [address], [place] [facility]: [address] [place], [place] [facility]: [place], [facility]): [phone] [facility]: [address] [place]: [address], [place] [facility]: [phone], [phone]  Home Blood Pressure Monitoring for Patients   Your provider has recommended that you check your  blood pressure (BP) at least once a week at home. If you do not have a blood pressure cuff at home, one will be provided for you. Contact your provider if you have not received your monitor within 1 week.   Helpful Tips for Accurate Home Blood Pressure Checks  Don't smoke, exercise, or drink caffeine 30 minutes before checking your BP Use the restroom before checking your BP (a full bladder can raise your  pressure) Relax in a comfortable upright chair Feet on the ground Left arm resting comfortably on a flat surface at the level of your heart Legs uncrossed Back supported Sit quietly and don't talk Place the cuff on your bare arm Adjust snuggly, so that only two fingertips can fit between your skin and the top of the cuff Check 2 readings separated by at least one minute Keep a log of your BP readings For a visual, please reference this diagram: http://ccnc.care/bpdiagram  Provider Name: [facility]     Phone: [phone]  Zone 1: ALL CLEAR  Continue to monitor your symptoms:  BP reading is less than 140 (top number) or less than 90 (bottom number)  No right upper stomach pain No headaches or seeing spots No feeling nauseated or throwing up No swelling in face and hands  Zone 2: CAUTION Call your doctor's office for any of the following:  BP reading is greater than 140 (top number) or greater than 90 (bottom number)  Stomach pain under your ribs in the middle or right side Headaches or seeing spots Feeling nauseated or throwing up Swelling in face and hands  Zone 3: EMERGENCY  Seek immediate medical care if you have any of the following:  BP reading is greater than160 (top number) or greater than 110 (bottom number) Severe headaches not improving with Tylenol Serious difficulty catching your breath Any worsening symptoms from Zone 2  Preterm Labor and Birth Information  The normal length of a pregnancy is 39-41 weeks. Preterm labor is when labor starts before 37 completed weeks of pregnancy. What are the risk factors for preterm labor? Preterm labor is more likely to occur in women who: Have certain infections during pregnancy such as a bladder infection, sexually transmitted infection, or infection inside the uterus (chorioamnionitis). Have a shorter-than-normal cervix. Have gone into preterm labor before. Have had surgery on their cervix. Are younger than age 89  or older than age 65. Are African American. Are pregnant with twins or multiple babies (multiple gestation). Take street drugs or smoke while pregnant. Do not gain enough weight while pregnant. Became pregnant shortly after having been pregnant. What are the symptoms of preterm labor? Symptoms of preterm labor include: Cramps similar to those that can happen during a menstrual period. The cramps may happen with diarrhea. Pain in the abdomen or lower back. Regular uterine contractions that may feel like tightening of the abdomen. A feeling of increased pressure in the pelvis. Increased watery or bloody mucus discharge from the vagina. Water breaking (ruptured amniotic sac). Why is it important to recognize signs of preterm labor? It is important to recognize signs of preterm labor because babies who are born prematurely may not be fully developed. This can put them at an increased risk for: Long-term (chronic) heart and lung problems. Difficulty immediately after birth with regulating body systems, including blood sugar, body temperature, heart rate, and breathing rate. Bleeding in the brain. Cerebral palsy. Learning difficulties. Death. These risks are highest for babies who are born before 71 weeks  of pregnancy. How is preterm labor treated? Treatment depends on the length of your pregnancy, your condition, and the health of your baby. It may involve: Having a stitch (suture) placed in your cervix to prevent your cervix from opening too early (cerclage). Taking or being given medicines, such as: Hormone medicines. These may be given early in pregnancy to help support the pregnancy. Medicine to stop contractions. Medicines to help mature the babys lungs. These may be prescribed if the risk of delivery is high. Medicines to prevent your baby from developing cerebral palsy. If the labor happens before 34 weeks of pregnancy, you may need to stay in the hospital. What should I do if I  think I am in preterm labor? If you think that you are going into preterm labor, call your health care provider right away. How can I prevent preterm labor in future pregnancies? To increase your chance of having a full-term pregnancy: Do not use any tobacco products, such as cigarettes, chewing tobacco, and e-cigarettes. If you need help quitting, ask your health care provider. Do not use street drugs or medicines that have not been prescribed to you during your pregnancy. Talk with your health care provider before taking any herbal supplements, even if you have been taking them regularly. Make sure you gain a healthy amount of weight during your pregnancy. Watch for infection. If you think that you might have an infection, get it checked right away. Make sure to tell your health care provider if you have gone into preterm labor before. This information is not intended to replace advice given to you by your health care provider. Make sure you discuss any questions you have with your health care provider. Document Revised: 05/30/2018 Document Reviewed: 06/29/2015 Elsevier Patient Education  [place].

## 2021-03-24 ENCOUNTER — Other Ambulatory Visit: Payer: Self-pay

## 2021-03-24 ENCOUNTER — Encounter: Payer: Self-pay | Admitting: *Deleted

## 2021-03-24 ENCOUNTER — Ambulatory Visit (INDEPENDENT_AMBULATORY_CARE_PROVIDER_SITE_OTHER): Payer: BC Managed Care – PPO | Admitting: *Deleted

## 2021-03-24 VITALS — BP 129/83 | HR 102 | Wt 270.0 lb

## 2021-03-24 DIAGNOSIS — Z0130 Encounter for examination of blood pressure without abnormal findings: Secondary | ICD-10-CM

## 2021-03-24 DIAGNOSIS — Z3403 Encounter for supervision of normal first pregnancy, third trimester: Secondary | ICD-10-CM

## 2021-03-24 NOTE — Progress Notes (Signed)
° °  NURSE VISIT- BLOOD PRESSURE CHECK  SUBJECTIVE:  Megan Houston is a 31 y.o. G18P0000 female here for BP check. She is [redacted]w[redacted]d pregnant    HYPERTENSION ROS:  Pregnant/postpartum:  Severe headaches that don't go away with tylenol/other medicines: No  Visual changes (seeing spots/double/blurred vision) No  Severe pain under right breast breast or in center of upper chest No  Severe nausea/vomiting No  Taking medicines as instructed not applicable    OBJECTIVE:  BP 129/83    Pulse (!) 102    Wt 270 lb (122.5 kg)    LMP 08/23/2020    BMI 44.93 kg/m   Appearance alert, well appearing, and in no distress.  ASSESSMENT: Pregnancy [redacted]w[redacted]d  blood pressure check  PLAN: Discussed with Knute Neu, CNM, [facility]   Recommendations: no changes needed   Follow-up:  in 2 weeks. Keep a check on BP at home. If BP goes up at home, let us know.     Levy Pupa  03/24/2021 9:38 AM

## 2021-03-31 ENCOUNTER — Encounter: Payer: Self-pay | Admitting: Women's Health

## 2021-04-03 ENCOUNTER — Other Ambulatory Visit: Payer: Self-pay | Admitting: Advanced Practice Midwife

## 2021-04-03 MED ORDER — DEXLANSOPRAZOLE 30 MG PO CPDR: 30.0000 mg | DELAYED_RELEASE_CAPSULE | Freq: Every day | ORAL | 2 refills | Status: DC

## 2021-04-03 NOTE — Progress Notes (Signed)
Dexilant for reflux not helped by nexium

## 2021-04-03 NOTE — Progress Notes (Unsigned)
Dexilant for reflux not helped by Nexium

## 2021-04-05 ENCOUNTER — Telehealth: Payer: Self-pay | Admitting: *Deleted

## 2021-04-05 ENCOUNTER — Other Ambulatory Visit: Payer: Self-pay | Admitting: Women's Health

## 2021-04-05 ENCOUNTER — Encounter: Payer: Self-pay | Admitting: Women's Health

## 2021-04-05 ENCOUNTER — Telehealth: Payer: Self-pay

## 2021-04-05 MED ORDER — OMEPRAZOLE MAGNESIUM 20 MG PO TBEC: 20.0000 mg | DELAYED_RELEASE_TABLET | Freq: Every day | ORAL | 2 refills | Status: DC

## 2021-04-05 NOTE — Telephone Encounter (Signed)
Megan Houston sent in [name] for patients reflux but this isn't preferred by her insurance. She also has tried nexium with no relief. Insurance prefers that she try Omeprazole or Lansoprazole. Would either of these be appropriate?

## 2021-04-06 ENCOUNTER — Other Ambulatory Visit: Payer: Self-pay | Admitting: Advanced Practice Midwife

## 2021-04-06 MED ORDER — PANTOPRAZOLE SODIUM 40 MG PO TBEC: 40.0000 mg | DELAYED_RELEASE_TABLET | Freq: Every day | ORAL | 3 refills | Status: DC

## 2021-04-06 NOTE — Progress Notes (Signed)
Protonix for GERD (insurance doesn't cover dexalant or prevcid)

## 2021-04-07 ENCOUNTER — Encounter: Payer: Self-pay | Admitting: Women's Health

## 2021-04-07 ENCOUNTER — Ambulatory Visit (INDEPENDENT_AMBULATORY_CARE_PROVIDER_SITE_OTHER): Payer: BC Managed Care – PPO | Admitting: Women's Health

## 2021-04-07 ENCOUNTER — Other Ambulatory Visit: Payer: Self-pay

## 2021-04-07 VITALS — BP 128/80 | HR 116 | Wt 273.4 lb

## 2021-04-07 DIAGNOSIS — Z3403 Encounter for supervision of normal first pregnancy, third trimester: Secondary | ICD-10-CM

## 2021-04-07 DIAGNOSIS — Z1389 Encounter for screening for other disorder: Secondary | ICD-10-CM

## 2021-04-07 LAB — POCT URINALYSIS DIPSTICK OB
Blood, UA: NEGATIVE
Glucose, UA: NEGATIVE
Ketones, UA: NEGATIVE
Leukocytes, UA: NEGATIVE
Nitrite, UA: NEGATIVE
POC,PROTEIN,UA: NEGATIVE

## 2021-04-07 NOTE — Patient Instructions (Signed)
Megan Houston, thank you for choosing our office today! We appreciate the opportunity to meet your healthcare needs. You may receive a short survey by mail, e-mail, or through [employer]. If you are happy with your care we would appreciate if you could take just a few minutes to complete the survey questions. We read all of your comments and take your feedback very seriously. Thank you again for choosing our office.  Center for [employer] Team at [place] at [facility] ([place], [place] [address]) Entrance C, located off of [place] parking   CLASSES: Go to [email or web address] to register for classes (childbirth, breastfeeding, waterbirth, infant CPR, daddy bootcamp, etc.)  Call the office [phone]) or go to [facility] if: You begin to have strong, frequent contractions Your water breaks.  Sometimes it is a big gush of fluid, sometimes it is just a trickle that keeps getting your panties wet or running down your legs You have vaginal bleeding.  It is normal to have a small amount of spotting if your cervix was checked.  You don't feel your baby moving like normal.  If you don't, get you something to eat and drink and lay down and focus on feeling your baby move.   If your baby is still not moving like normal, you should call the office or go to [facility].  Call the office [phone]) or go to [facility] hospital for these signs of pre-eclampsia: Severe headache that does not go away with Tylenol Visual changes- seeing spots, double, blurred vision Pain under your right breast or upper abdomen that does not go away with Tums or heartburn medicine Nausea and/or vomiting Severe swelling in your hands, feet, and face   Tdap Vaccine It is recommended that you get the Tdap vaccine during the third trimester of EACH pregnancy to help protect your baby from getting pertussis (whooping cough) 27-36 weeks is the BEST time to do  this so that you can pass the protection on to your baby. During pregnancy is better than after pregnancy, but if you are unable to get it during pregnancy it will be offered at the hospital.  You can get this vaccine with [place], at the health department, your family doctor, or some local pharmacies Everyone who will be around your baby should also be up-to-date on their vaccines before the baby comes. Adults (who are not pregnant) only need 1 dose of Tdap during adulthood.   [facility] Pediatricians/Family Doctors [facility]): [address] [place], [facility]: [address] [place], [phone]                [facility]): [place], [phone] (call to ask if accepting patients) [facility] Department: [address], [place], [place] Pediatricians/Family Doctors [facility]): [place]. [place], [address], [place] [facility]: [address] [place], [place] [facility]: [place], [facility]): [phone] [facility]: [address], [place]: [address], [place] [facility]: [phone], [phone]  Home Blood Pressure Monitoring for Patients   Your provider has recommended that you check your  blood pressure (BP) at least once a week at home. If you do not have a blood pressure cuff at home, one will be provided for you. Contact your provider if you have not received your monitor within 1 week.   Helpful Tips for Accurate Home Blood Pressure Checks  Don't smoke, exercise, or drink caffeine 30 minutes before checking your BP Use the restroom before checking your BP (a full bladder can raise your  pressure) Relax in a comfortable upright chair Feet on the ground Left arm resting comfortably on a flat surface at the level of your heart Legs uncrossed Back supported Sit quietly and don't talk Place the cuff on your bare arm Adjust snuggly, so that only two fingertips can fit between your skin and the top of the cuff Check 2 readings separated by at least one minute Keep a log of your BP readings For a visual, please reference this diagram: http://ccnc.care/bpdiagram  Provider Name: [facility]     Phone: [phone]  Zone 1: ALL CLEAR  Continue to monitor your symptoms:  BP reading is less than 140 (top number) or less than 90 (bottom number)  No right upper stomach pain No headaches or seeing spots No feeling nauseated or throwing up No swelling in face and hands  Zone 2: CAUTION Call your doctor's office for any of the following:  BP reading is greater than 140 (top number) or greater than 90 (bottom number)  Stomach pain under your ribs in the middle or right side Headaches or seeing spots Feeling nauseated or throwing up Swelling in face and hands  Zone 3: EMERGENCY  Seek immediate medical care if you have any of the following:  BP reading is greater than160 (top number) or greater than 110 (bottom number) Severe headaches not improving with Tylenol Serious difficulty catching your breath Any worsening symptoms from Zone 2  Preterm Labor and Birth Information  The normal length of a pregnancy is 39-41 weeks. Preterm labor is when labor starts before 37 completed weeks of pregnancy. What are the risk factors for preterm labor? Preterm labor is more likely to occur in women who: Have certain infections during pregnancy such as a bladder infection, sexually transmitted infection, or infection inside the uterus (chorioamnionitis). Have a shorter-than-normal cervix. Have gone into preterm labor before. Have had surgery on their cervix. Are younger than age 14  or older than age 59. Are African American. Are pregnant with twins or multiple babies (multiple gestation). Take street drugs or smoke while pregnant. Do not gain enough weight while pregnant. Became pregnant shortly after having been pregnant. What are the symptoms of preterm labor? Symptoms of preterm labor include: Cramps similar to those that can happen during a menstrual period. The cramps may happen with diarrhea. Pain in the abdomen or lower back. Regular uterine contractions that may feel like tightening of the abdomen. A feeling of increased pressure in the pelvis. Increased watery or bloody mucus discharge from the vagina. Water breaking (ruptured amniotic sac). Why is it important to recognize signs of preterm labor? It is important to recognize signs of preterm labor because babies who are born prematurely may not be fully developed. This can put them at an increased risk for: Long-term (chronic) heart and lung problems. Difficulty immediately after birth with regulating body systems, including blood sugar, body temperature, heart rate, and breathing rate. Bleeding in the brain. Cerebral palsy. Learning difficulties. Death. These risks are highest for babies who are born before 11 weeks  of pregnancy. How is preterm labor treated? Treatment depends on the length of your pregnancy, your condition, and the health of your baby. It may involve: Having a stitch (suture) placed in your cervix to prevent your cervix from opening too early (cerclage). Taking or being given medicines, such as: Hormone medicines. These may be given early in pregnancy to help support the pregnancy. Medicine to stop contractions. Medicines to help mature the babys lungs. These may be prescribed if the risk of delivery is high. Medicines to prevent your baby from developing cerebral palsy. If the labor happens before 34 weeks of pregnancy, you may need to stay in the hospital. What should I do if I  think I am in preterm labor? If you think that you are going into preterm labor, call your health care provider right away. How can I prevent preterm labor in future pregnancies? To increase your chance of having a full-term pregnancy: Do not use any tobacco products, such as cigarettes, chewing tobacco, and e-cigarettes. If you need help quitting, ask your health care provider. Do not use street drugs or medicines that have not been prescribed to you during your pregnancy. Talk with your health care provider before taking any herbal supplements, even if you have been taking them regularly. Make sure you gain a healthy amount of weight during your pregnancy. Watch for infection. If you think that you might have an infection, get it checked right away. Make sure to tell your health care provider if you have gone into preterm labor before. This information is not intended to replace advice given to you by your health care provider. Make sure you discuss any questions you have with your health care provider. Document Revised: 05/30/2018 Document Reviewed: 06/29/2015 Elsevier Patient Education  [place].

## 2021-04-07 NOTE — Progress Notes (Signed)
LOW-RISK PREGNANCY VISIT Patient name: Megan Houston MRN [id number]  Date of birth: [birth year] Chief Complaint:   Routine Prenatal Visit  History of Present Illness:   Megan Houston is a 31 y.o. G70P0000 female at [redacted]w[redacted]d with an Estimated Date of Delivery: 05/30/21 being seen today for ongoing management of a low-risk pregnancy.   Today she reports  some intermittent decreased fm. Baby moving great this am. Home bp's 120s/80s.  Denies severe ha's, visual changes, ruq/epigastric pain, n/v.   Contractions: Irritability. Vag. Bleeding: None.  Movement: Present. denies leaking of fluid.  Depression screen [facility] 2/9 03/08/2021 11/15/2020 04/01/2020 07/11/2017  Decreased Interest 0 0 1 0  Down, Depressed, Hopeless 0 0 1 0  PHQ - 2 Score 0 0 2 0  Altered sleeping 2 2 1  -  Tired, decreased energy 3 2 1  -  Change in appetite 0 0 1 -  Feeling bad or failure about yourself  0 0 1 -  Trouble concentrating 0 0 1 -  Moving slowly or fidgety/restless 0 0 1 -  Suicidal thoughts 0 0 0 -  PHQ-9 Score 5 4 8  -  Some encounter information is confidential and restricted. Go to Review Flowsheets activity to see all data.     GAD 7 : Generalized Anxiety Score 03/08/2021 11/15/2020 04/01/2020  Nervous, Anxious, on Edge 0 0 1  Control/stop worrying 0 0 1  Worry too much - different things 0 0 1  Trouble relaxing 0 0 1  Restless 0 0 1  Easily annoyed or irritable 0 0 1  Afraid - awful might happen 0 0 1  Total GAD 7 Score 0 0 7      Review of Systems:   Pertinent items are noted in HPI Denies abnormal vaginal discharge w/ itching/odor/irritation, headaches, visual changes, shortness of breath, chest pain, abdominal pain, severe nausea/vomiting, or problems with urination or bowel movements unless otherwise stated above. Pertinent History Reviewed:  Reviewed past medical,surgical, social, obstetrical and family history.  Reviewed problem list, medications and allergies. Physical Assessment:   Vitals:    04/07/21 0916 04/07/21 0920  BP: (!) 152/78 128/80  Pulse: (!) 118 (!) 116  Weight: 273 lb 6.4 oz (124 kg)   Body mass index is 45.5 kg/m.        Physical Examination:   General appearance: Well appearing, and in no distress  Mental status: Alert, oriented to person, place, and time  Skin: Warm & dry  Cardiovascular: Normal heart rate noted  Respiratory: Normal respiratory effort, no distress  Abdomen: Soft, gravid, nontender  Pelvic: Cervical exam deferred         Extremities: Edema: Trace  Fetal Status: Fetal Heart Rate (bpm): 135 Fundal Height: 32 cm Movement: Present    Chaperone: N/A   Results for orders placed or performed in visit on 04/07/21 (from the past 24 hour(s))  POC Urinalysis Dipstick OB   Collection Time: 04/07/21  9:23 AM  Result Value Ref Range   Color, UA     Clarity, UA     Glucose, UA Negative Negative   Bilirubin, UA     Ketones, UA neg    Spec Grav, UA     Blood, UA neg    pH, UA     POC,PROTEIN,UA Negative Negative, Trace, Small (1+), Moderate (2+), Large (3+), 4+   Urobilinogen, UA     Nitrite, UA neg    Leukocytes, UA Negative Negative   Appearance  Odor      Assessment & Plan:  1) Low-risk pregnancy G1P0000 at [redacted]w[redacted]d with an Estimated Date of Delivery: 05/30/21   2) Initially elevated bp, completely normal on repeat, same on 2/1. Home bp's 120/80s, asymptomatic, no proteinuria. Discussed we typically dx GHTN w/ 2 elevated bp's, but I'm hesitant to jump to dx as her repeats and home bp's have all been wnl and she is asymptomatic. Discussed w/ LHE, who agrees. Pt to check bp's BID at home, let us know if getting 140s/90s or greater, if develops sx, etc.   3) Intermittent decreased fm> great movement this am, discussed sleep cycles and FKC, and if ever <10/2hr, call us/go to [facility] for eval  4) Interested in waterbirth> understands if dx w/ GHTN will no longer be a candidate, has already attended class   Meds: No orders of the defined types  were placed in this encounter.  Labs/procedures today: none  Plan:  Continue routine obstetrical care  Next visit: prefers in person    Reviewed: Preterm labor symptoms and general obstetric precautions including but not limited to vaginal bleeding, contractions, leaking of fluid and fetal movement were reviewed in detail with the patient.  All questions were answered. Does have home bp cuff. Office bp cuff given: not applicable. Check bp twice daily, let us know if consistently >140 and/or >90.  Follow-up: Return in about 1 week (around 04/14/2021) for nurse bp check; then 2wks LROB w/ CNM in person.  Future Appointments  Date Time Provider [place]  04/21/2021  8:30 AM Roma Schanz, CNM [facility]  04/28/2021  9:10 AM Florian Buff, MD [facility]  05/05/2021  8:30 AM Roma Schanz, CNM [facility]  05/12/2021  9:10 AM Janyth Pupa, DO [facility]  05/19/2021  8:30 AM Roma Schanz, CNM [facility]  05/25/2021  8:30 AM Vevelyn Pat, Joaquim Lai, CNM [facility]    Orders Placed This Encounter  Procedures   POC Urinalysis Dipstick OB   Roma Schanz CNM, [facility] 04/07/2021 9:50 AM

## 2021-04-10 ENCOUNTER — Telehealth: Payer: Self-pay | Admitting: *Deleted

## 2021-04-10 NOTE — Telephone Encounter (Signed)
Pt's insurance has approved Protonix. Good 04/10/21-04/09/22. Pt and [facility] aware. [place]

## 2021-04-11 NOTE — Telephone Encounter (Signed)
Request completed - disregard

## 2021-04-14 ENCOUNTER — Ambulatory Visit (INDEPENDENT_AMBULATORY_CARE_PROVIDER_SITE_OTHER): Payer: BC Managed Care – PPO | Admitting: *Deleted

## 2021-04-14 ENCOUNTER — Other Ambulatory Visit: Payer: Self-pay

## 2021-04-14 ENCOUNTER — Encounter: Payer: Self-pay | Admitting: *Deleted

## 2021-04-14 VITALS — BP 130/83 | HR 120 | Ht 65.5 in | Wt 276.0 lb

## 2021-04-14 DIAGNOSIS — Z3403 Encounter for supervision of normal first pregnancy, third trimester: Secondary | ICD-10-CM

## 2021-04-14 NOTE — Progress Notes (Signed)
° °  NURSE VISIT- BLOOD PRESSURE CHECK  SUBJECTIVE:  Megan Houston is a 31 y.o. G68P0000 female here for BP check. She is [redacted]w[redacted]d pregnant    HYPERTENSION ROS:  Pregnant/postpartum:  Severe headaches that don't go away with tylenol/other medicines: No  Visual changes (seeing spots/double/blurred vision) No  Severe pain under right breast breast or in center of upper chest No  Severe nausea/vomiting No  Taking medicines as instructed not applicable    OBJECTIVE:  BP 130/83    Pulse (!) 120    Ht 5' 5.5" (1.664 m)    Wt 276 lb (125.2 kg)    LMP 08/23/2020    BMI 45.23 kg/m  Pt's wrist monitor read 118/77.  Appearance alert, well appearing, and in no distress.  ASSESSMENT: Pregnancy [redacted]w[redacted]d  blood pressure check  PLAN: Discussed with Dr. Nelda Marseille   Recommendations: no changes needed   Follow-up: as scheduled   Levy Pupa  04/14/2021 9:49 AM

## 2021-04-21 ENCOUNTER — Ambulatory Visit (INDEPENDENT_AMBULATORY_CARE_PROVIDER_SITE_OTHER): Payer: BC Managed Care – PPO | Admitting: Women's Health

## 2021-04-21 ENCOUNTER — Other Ambulatory Visit: Payer: Self-pay

## 2021-04-21 ENCOUNTER — Encounter: Payer: Self-pay | Admitting: Women's Health

## 2021-04-21 VITALS — BP 125/83 | HR 113 | Wt 276.2 lb

## 2021-04-21 DIAGNOSIS — Z3403 Encounter for supervision of normal first pregnancy, third trimester: Secondary | ICD-10-CM

## 2021-04-21 NOTE — Patient Instructions (Signed)
Megan Houston, thank you for choosing our office today! We appreciate the opportunity to meet your healthcare needs. You may receive a short survey by mail, e-mail, or through [employer]. If you are happy with your care we would appreciate if you could take just a few minutes to complete the survey questions. We read all of your comments and take your feedback very seriously. Thank you again for choosing our office.  Center for [employer] Team at [place] at [facility] ([place], [place] [address]) Entrance C, located off of [place] parking   CLASSES: Go to [email or web address] to register for classes (childbirth, breastfeeding, waterbirth, infant CPR, daddy bootcamp, etc.)  Call the office [phone]) or go to [facility] if: You begin to have strong, frequent contractions Your water breaks.  Sometimes it is a big gush of fluid, sometimes it is just a trickle that keeps getting your panties wet or running down your legs You have vaginal bleeding.  It is normal to have a small amount of spotting if your cervix was checked.  You don't feel your baby moving like normal.  If you don't, get you something to eat and drink and lay down and focus on feeling your baby move.   If your baby is still not moving like normal, you should call the office or go to [facility].  Call the office [phone]) or go to [facility] hospital for these signs of pre-eclampsia: Severe headache that does not go away with Tylenol Visual changes- seeing spots, double, blurred vision Pain under your right breast or upper abdomen that does not go away with Tums or heartburn medicine Nausea and/or vomiting Severe swelling in your hands, feet, and face   Tdap Vaccine It is recommended that you get the Tdap vaccine during the third trimester of EACH pregnancy to help protect your baby from getting pertussis (whooping cough) 27-36 weeks is the BEST time to do  this so that you can pass the protection on to your baby. During pregnancy is better than after pregnancy, but if you are unable to get it during pregnancy it will be offered at the hospital.  You can get this vaccine with [place], at the health department, your family doctor, or some local pharmacies Everyone who will be around your baby should also be up-to-date on their vaccines before the baby comes. Adults (who are not pregnant) only need 1 dose of Tdap during adulthood.   [facility] Pediatricians/Family Doctors [facility]): [address] [place], [facility]: [address] [place], [phone]                [facility]): [place], [phone] (call to ask if accepting patients) [facility]: [address], [place], [place] Pediatricians/Family Doctors [facility]): [place]. [place], [address], [facility]: [address] [place], [facility] [place]: [place], [place] [facility]): [phone] [facility]: [address] [place]: [address], [place] [facility]: [phone], [phone]  Home Blood Pressure Monitoring for Patients   Your provider has recommended that you check your  blood pressure (BP) at least once a week at home. If you do not have a blood pressure cuff at home, one will be provided for you. Contact your provider if you have not received your monitor within 1 week.   Helpful Tips for Accurate Home Blood Pressure Checks  Don't smoke, exercise, or drink caffeine 30 minutes before checking your BP Use the restroom before checking your BP (a full bladder can raise your  pressure) Relax in a comfortable upright chair Feet on the ground Left arm resting comfortably on a flat surface at the level of your heart Legs uncrossed Back supported Sit quietly and don't talk Place the cuff on your bare arm Adjust snuggly, so that only two fingertips can fit between your skin and the top of the cuff Check 2 readings separated by at least one minute Keep a log of your BP readings For a visual, please reference this diagram: http://ccnc.care/bpdiagram  Provider Name: [facility]     Phone: [phone]  Zone 1: ALL CLEAR  Continue to monitor your symptoms:  BP reading is less than 140 (top number) or less than 90 (bottom number)  No right upper stomach pain No headaches or seeing spots No feeling nauseated or throwing up No swelling in face and hands  Zone 2: CAUTION Call your doctor's office for any of the following:  BP reading is greater than 140 (top number) or greater than 90 (bottom number)  Stomach pain under your ribs in the middle or right side Headaches or seeing spots Feeling nauseated or throwing up Swelling in face and hands  Zone 3: EMERGENCY  Seek immediate medical care if you have any of the following:  BP reading is greater than160 (top number) or greater than 110 (bottom number) Severe headaches not improving with Tylenol Serious difficulty catching your breath Any worsening symptoms from Zone 2  Preterm Labor and Birth Information  The normal length of a pregnancy is 39-41 weeks. Preterm labor is when labor starts before 37 completed weeks of pregnancy. What are the risk factors for preterm labor? Preterm labor is more likely to occur in women who: Have certain infections during pregnancy such as a bladder infection, sexually transmitted infection, or infection inside the uterus (chorioamnionitis). Have a shorter-than-normal cervix. Have gone into preterm labor before. Have had surgery on their cervix. Are younger than age 6  or older than age 53. Are African American. Are pregnant with twins or multiple babies (multiple gestation). Take street drugs or smoke while pregnant. Do not gain enough weight while pregnant. Became pregnant shortly after having been pregnant. What are the symptoms of preterm labor? Symptoms of preterm labor include: Cramps similar to those that can happen during a menstrual period. The cramps may happen with diarrhea. Pain in the abdomen or lower back. Regular uterine contractions that may feel like tightening of the abdomen. A feeling of increased pressure in the pelvis. Increased watery or bloody mucus discharge from the vagina. Water breaking (ruptured amniotic sac). Why is it important to recognize signs of preterm labor? It is important to recognize signs of preterm labor because babies who are born prematurely may not be fully developed. This can put them at an increased risk for: Long-term (chronic) heart and lung problems. Difficulty immediately after birth with regulating body systems, including blood sugar, body temperature, heart rate, and breathing rate. Bleeding in the brain. Cerebral palsy. Learning difficulties. Death. These risks are highest for babies who are born before 68 weeks  of pregnancy. How is preterm labor treated? Treatment depends on the length of your pregnancy, your condition, and the health of your baby. It may involve: Having a stitch (suture) placed in your cervix to prevent your cervix from opening too early (cerclage). Taking or being given medicines, such as: Hormone medicines. These may be given early in pregnancy to help support the pregnancy. Medicine to stop contractions. Medicines to help mature the babys lungs. These may be prescribed if the risk of delivery is high. Medicines to prevent your baby from developing cerebral palsy. If the labor happens before 34 weeks of pregnancy, you may need to stay in the hospital. What should I do if I  think I am in preterm labor? If you think that you are going into preterm labor, call your health care provider right away. How can I prevent preterm labor in future pregnancies? To increase your chance of having a full-term pregnancy: Do not use any tobacco products, such as cigarettes, chewing tobacco, and e-cigarettes. If you need help quitting, ask your health care provider. Do not use street drugs or medicines that have not been prescribed to you during your pregnancy. Talk with your health care provider before taking any herbal supplements, even if you have been taking them regularly. Make sure you gain a healthy amount of weight during your pregnancy. Watch for infection. If you think that you might have an infection, get it checked right away. Make sure to tell your health care provider if you have gone into preterm labor before. This information is not intended to replace advice given to you by your health care provider. Make sure you discuss any questions you have with your health care provider. Document Revised: 05/30/2018 Document Reviewed: 06/29/2015 Elsevier Patient Education  [place].

## 2021-04-21 NOTE — Progress Notes (Signed)
? ? ?LOW-RISK PREGNANCY VISIT ?Patient name: Megan Houston MRN [id number]  Date of birth: October 07, 1990 ?Chief Complaint:   ?Routine Prenatal Visit (Having more pelvic pain) ? ?History of Present Illness:   ?Megan Houston is a 31 y.o. G38P0000 female at [redacted]w[redacted]d with an Estimated Date of Delivery: 05/30/21 being seen today for ongoing management of a low-risk pregnancy.  ? ?Today she reports  continued pelvic pain, hard to walk, change positions . Contractions: Irritability.  .  Movement: Present. denies leaking of fluid. ? ?Depression screen [facility] 2/9 03/08/2021 11/15/2020 04/01/2020 07/11/2017  ?Decreased Interest 0 0 1 0  ?Down, Depressed, Hopeless 0 0 1 0  ?PHQ - 2 Score 0 0 2 0  ?Altered sleeping 2 2 1  -  ?Tired, decreased energy 3 2 1  -  ?Change in appetite 0 0 1 -  ?Feeling bad or failure about yourself  0 0 1 -  ?Trouble concentrating 0 0 1 -  ?Moving slowly or fidgety/restless 0 0 1 -  ?Suicidal thoughts 0 0 0 -  ?PHQ-9 Score 5 4 8  -  ?Some encounter information is confidential and restricted. Go to Review Flowsheets activity to see all data.  ? ?  ?GAD 7 : Generalized Anxiety Score 03/08/2021 11/15/2020 04/01/2020  ?Nervous, Anxious, on Edge 0 0 1  ?Control/stop worrying 0 0 1  ?Worry too much - different things 0 0 1  ?Trouble relaxing 0 0 1  ?Restless 0 0 1  ?Easily annoyed or irritable 0 0 1  ?Afraid - awful might happen 0 0 1  ?Total GAD 7 Score 0 0 7  ? ? ?  ?Review of Systems:   ?Pertinent items are noted in HPI ?Denies abnormal vaginal discharge w/ itching/odor/irritation, headaches, visual changes, shortness of breath, chest pain, abdominal pain, severe nausea/vomiting, or problems with urination or bowel movements unless otherwise stated above. ?Pertinent History Reviewed:  ?Reviewed past medical,surgical, social, obstetrical and family history.  ?Reviewed problem list, medications and allergies. ?Physical Assessment:  ? ?Vitals:  ? 04/21/21 0830  ?BP: 125/83  ?Pulse: (!) 113  ?Weight: 276 lb 3.2 oz (125.3  kg)  ?Body mass index is 45.26 kg/m?. ?  ?     Physical Examination:  ? General appearance: Well appearing, and in no distress ? Mental status: Alert, oriented to person, place, and time ? Skin: Warm & dry ? Cardiovascular: Normal heart rate noted ? Respiratory: Normal respiratory effort, no distress ? Abdomen: Soft, gravid, nontender ? Pelvic: Cervical exam deferred        ? Extremities: Edema: Trace ? ?Fetal Status: Fetal Heart Rate (bpm): 151 Fundal Height: 34 cm Movement: Present   ? ?Chaperone: N/A   ?No results found for this or any previous visit (from the past 24 hour(s)).  ?Assessment & Plan:  ?1) Low-risk pregnancy G1P0000 at [redacted]w[redacted]d with an Estimated Date of Delivery: 05/30/21  ? ?2) Pelvic pain, SPD can try pelvic girdle ?  ?Meds: No orders of the defined types were placed in this encounter. ? ?Labs/procedures today: none ? ?Plan:  Continue routine obstetrical care  ?Next visit: prefers will be in person for cultures, waterbirth consent    ? ?Reviewed: Preterm labor symptoms and general obstetric precautions including but not limited to vaginal bleeding, contractions, leaking of fluid and fetal movement were reviewed in detail with the patient.  All questions were answered. Does have home bp cuff. Office bp cuff given: not applicable. Check bp weekly, let us know if consistently >140 and/or >  90. ? ?Follow-up: Return for does not need appt 3/10 (not sure how this got scheduled); f/u 3/17 as scheduled. ? ?Future Appointments  ?Date Time Provider [place]  ?04/28/2021  9:10 AM Florian Buff, MD [facility]  ?05/05/2021  8:30 AM Roma Schanz, CNM [facility]  ?05/12/2021  9:10 AM Janyth Pupa, DO [facility]  ?05/19/2021  8:30 AM Roma Schanz, CNM [facility]  ?05/25/2021  8:30 AM Vevelyn Pat, Joaquim Lai, CNM [facility]  ? ? ?No orders of the defined types were placed in this encounter. ? ?Roma Schanz CNM, WHNP-BC ?04/21/2021 ?9:07 AM  ?

## 2021-04-28 ENCOUNTER — Encounter: Payer: BC Managed Care – PPO | Admitting: Obstetrics & Gynecology

## 2021-05-05 ENCOUNTER — Encounter: Payer: Self-pay | Admitting: Women's Health

## 2021-05-05 ENCOUNTER — Other Ambulatory Visit (HOSPITAL_COMMUNITY)
Admission: RE | Admit: 2021-05-05 | Discharge: 2021-05-05 | Disposition: A | Discharge status: Home / Self Care | Payer: BC Managed Care – PPO | Source: Ambulatory Visit | Attending: Women's Health | Admitting: Women's Health

## 2021-05-05 ENCOUNTER — Telehealth (HOSPITAL_COMMUNITY): Payer: Self-pay | Admitting: *Deleted

## 2021-05-05 ENCOUNTER — Other Ambulatory Visit: Payer: Self-pay

## 2021-05-05 ENCOUNTER — Ambulatory Visit (INDEPENDENT_AMBULATORY_CARE_PROVIDER_SITE_OTHER): Payer: BC Managed Care – PPO | Admitting: Women's Health

## 2021-05-05 VITALS — BP 139/88 | HR 107 | Wt 280.0 lb

## 2021-05-05 DIAGNOSIS — Z3A36 36 weeks gestation of pregnancy: Secondary | ICD-10-CM

## 2021-05-05 DIAGNOSIS — Z1389 Encounter for screening for other disorder: Secondary | ICD-10-CM

## 2021-05-05 DIAGNOSIS — O0993 Supervision of high risk pregnancy, unspecified, third trimester: Secondary | ICD-10-CM | POA: Diagnosis not present

## 2021-05-05 DIAGNOSIS — O133 Gestational [pregnancy-induced] hypertension without significant proteinuria, third trimester: Secondary | ICD-10-CM | POA: Insufficient documentation

## 2021-05-05 LAB — POCT URINALYSIS DIPSTICK OB
Blood, UA: NEGATIVE
Glucose, UA: NEGATIVE
Ketones, UA: NEGATIVE
Leukocytes, UA: NEGATIVE
Nitrite, UA: NEGATIVE
POC,PROTEIN,UA: NEGATIVE

## 2021-05-05 LAB — OB RESULTS CONSOLE GC/CHLAMYDIA: Gonorrhea: NEGATIVE

## 2021-05-05 NOTE — Telephone Encounter (Signed)
Preadmission screen  

## 2021-05-05 NOTE — Patient Instructions (Signed)
Kinnie Feil, thank you for choosing our office today! We appreciate the opportunity to meet your healthcare needs. You may receive a short survey by mail, e-mail, or through [employer]. If you are happy with your care we would appreciate if you could take just a few minutes to complete the survey questions. We read all of your comments and take your feedback very seriously. Thank you again for choosing our office.  ?Center for [employer] Team at [facility] ? ?Women's & [place] at [facility] ?([address] [place], [place] [address]) ?Entrance C, located off of [address] ?Free 24/7 valet parking  ? ?CLASSES: Go to [email or web address] to register for classes (childbirth, breastfeeding, waterbirth, infant CPR, daddy bootcamp, etc.) ? ?Call the office [phone]) or go to [facility] if: ?You begin to have strong, frequent contractions ?Your water breaks.  Sometimes it is a big gush of fluid, sometimes it is just a trickle that keeps getting your panties wet or running down your legs ?You have vaginal bleeding.  It is normal to have a small amount of spotting if your cervix was checked.  ?You don't feel your baby moving like normal.  If you don't, get you something to eat and drink and lay down and focus on feeling your baby move.   If your baby is still not moving like normal, you should call the office or go to [facility]. ? ?Call the office [phone]) or go to [facility] hospital for these signs of pre-eclampsia: ?Severe headache that does not go away with Tylenol ?Visual changes- seeing spots, double, blurred vision ?Pain under your right breast or upper abdomen that does not go away with Tums or heartburn medicine ?Nausea and/or vomiting ?Severe swelling in your hands, feet, and face  ? ?[place] Pediatricians/Family Doctors ?[facility]): [address] [place], [phone]           ?[facility]: [address], [phone]                 ?[facility]): [place], [phone] (call to ask if accepting patients) ?[facility]: [address], [place], [place]   ? ?[place] Pediatricians/Family Doctors ?[facility]): [address], [phone] ?[place]: [address] [place], [phone] ?[facility] [place]: [place], [phone] ? ?[place]  ?[facility]): [phone] ?[facility]: [place], [phone]  ? ?[place] ?[place]: [place] [address], [phone] ? ?[place]  ?[facility]: [phone], [phone] ? ?Home Blood Pressure Monitoring for Patients  ? ?Your provider has recommended that you check your blood pressure (BP) at least once a week at home. If you do not have a blood pressure cuff at home, one will be provided for you. Contact your provider if you have not received your monitor within 1 week.  ? ?Helpful Tips for Accurate Home Blood Pressure Checks  ?Don't smoke, exercise, or drink caffeine 30 minutes before checking your BP ?Use the restroom before checking your BP (a full bladder can raise your pressure) ?Relax in a comfortable upright chair ?Feet on the ground ?Left arm resting comfortably on a flat surface at the level of your heart ?Legs uncrossed ?Back supported ?Sit quietly and don't talk ?Place the cuff on your bare arm ?Adjust snuggly, so that only two fingertips  can fit between your skin and the top of the cuff ?Check 2 readings separated by at least one minute ?Keep a log of your BP readings ?For a visual, please reference this diagram: http://ccnc.care/bpdiagram ? ?Provider Name: [facility]     Phone: [phone] ? ?Zone 1: ALL CLEAR  ?Continue to monitor your symptoms:  ?BP reading is less than 140 (top number) or less than 90 (bottom number)  ?No right  upper stomach pain ?No headaches or seeing spots ?No feeling nauseated or throwing up ?No swelling in face and hands ? ?Zone 2: CAUTION ?Call your doctor's office for any of the following:  ?BP reading is greater than 140 (top number) or greater than 90 (bottom number)  ?Stomach pain under your ribs in the middle or right side ?Headaches or seeing spots ?Feeling nauseated or throwing up ?Swelling in face and hands ? ?Zone 3: EMERGENCY  ?Seek immediate medical care if you have any of the following:  ?BP reading is greater than160 (top number) or greater than 110 (bottom number) ?Severe headaches not improving with Tylenol ?Serious difficulty catching your breath ?Any worsening symptoms from Zone 2  ? ?Braxton Hicks Contractions ?Contractions of the uterus can occur throughout pregnancy, but they are not always a sign that you are in labor. You may have practice contractions called Braxton Hicks contractions. These false labor contractions are sometimes confused with true labor. ?What are [place] contractions? ?Braxton Hicks contractions are tightening movements that occur in the muscles of the uterus before labor. Unlike true labor contractions, these contractions do not result in opening (dilation) and thinning of the cervix. Toward the end of pregnancy (32-34 weeks), Braxton Hicks contractions can happen more often and may become stronger. These contractions are sometimes difficult to tell apart from true labor because they can be very uncomfortable. You should not feel embarrassed if you go to the hospital with false labor. ?Sometimes, the only way to tell if you are in true labor is for your health care provider to look for changes in the cervix. The health care provider will do a physical exam and may monitor your contractions. If you are not in true labor, the exam should show that your cervix is not dilating and your water has not broken. ?If there are no other health problems associated with your  pregnancy, it is completely safe for you to be sent home with false labor. You may continue to have Braxton Hicks contractions until you go into true labor. ?How to tell the difference between true labor and false labor ?True labor ?Contractions last 30-70 seconds. ?Contractions become very regular. ?Discomfort is usually felt in the top of the uterus, and it spreads to the lower abdomen and low back. ?Contractions do not go away with walking. ?Contractions usually become more intense and increase in frequency. ?The cervix dilates and gets thinner. ?False labor ?Contractions are usually shorter and not as strong as true labor contractions. ?Contractions are usually irregular. ?Contractions are often felt in the front of the lower abdomen and in the groin. ?Contractions may go away when you walk around or change positions while lying down. ?Contractions get weaker and are shorter-lasting as time goes on. ?The cervix usually does not dilate or become thin. ?Follow these instructions at home: ? ?Take over-the-counter and prescription medicines only as told by your health care provider. ?Keep up with your usual exercises and follow other instructions from your health care provider. ?Eat and drink lightly if you think  you are going into labor. ?If Braxton Hicks contractions are making you uncomfortable: ?Change your position from lying down or resting to walking, or change from walking to resting. ?Sit and rest in a tub of warm water. ?Drink enough fluid to keep your urine pale yellow. Dehydration may cause these contractions. ?Do slow and deep breathing several times an hour. ?Keep all follow-up prenatal visits as told by your health care provider. This is important. ?Contact a health care provider if: ?You have a fever. ?You have continuous pain in your abdomen. ?Get help right away if: ?Your contractions become stronger, more regular, and closer together. ?You have fluid leaking or gushing from your vagina. ?You pass  blood-tinged mucus (bloody show). ?You have bleeding from your vagina. ?You have low back pain that you never had before. ?You feel your baby?s head pushing down and causing pelvic pressure. ?Your baby is not

## 2021-05-05 NOTE — Progress Notes (Signed)
? ?HIGH-RISK PREGNANCY VISIT ?Patient name: Megan Houston MRN [id number]  Date of birth: 11-29-1990 ?Chief Complaint:   ?Routine Prenatal Visit and Cultures ? ?History of Present Illness:   ?Megan Houston is a 31 y.o. G33P0000 female at 32w3dwith an Estimated Date of Delivery: 05/30/21 being seen today for ongoing management of a high-risk pregnancy complicated by gestational hypertension currently on no meds- dx today.   ? ?Today she reports no complaints. Denies ha, visual changes, ruq/epigastric pain, n/v.   Contractions: Irritability. Vag. Bleeding: None.  Movement: Present. denies leaking of fluid.  ? ?[facility] 03/08/2021 11/15/2020 04/01/2020 07/11/2017  ?Decreased Interest 0 0 1 0  ?Down, Depressed, Hopeless 0 0 1 0  ?PHQ - 2 Score 0 0 2 0  ?Altered sleeping '2 2 1 '$ -  ?Tired, decreased energy '3 2 1 '$ -  ?Change in appetite 0 0 1 -  ?Feeling bad or failure about yourself  0 0 1 -  ?Trouble concentrating 0 0 1 -  ?Moving slowly or fidgety/restless 0 0 1 -  ?Suicidal thoughts 0 0 0 -  ?PHQ-9 Score '5 4 8 '$ -  ?Some encounter information is confidential and restricted. Go to Review Flowsheets activity to see all data.  ? ?  ?GAD 7 : Generalized Anxiety Score 03/08/2021 11/15/2020 04/01/2020  ?Nervous, Anxious, on Edge 0 0 1  ?Control/stop worrying 0 0 1  ?Worry too much - different things 0 0 1  ?Trouble relaxing 0 0 1  ?Restless 0 0 1  ?Easily annoyed or irritable 0 0 1  ?Afraid - awful might happen 0 0 1  ?Total GAD 7 Score 0 0 7  ? ? ? ?Review of Systems:   ?Pertinent items are noted in HPI ?Denies abnormal vaginal discharge w/ itching/odor/irritation, headaches, visual changes, shortness of breath, chest pain, abdominal pain, severe nausea/vomiting, or problems with urination or bowel movements unless otherwise stated above. ?Pertinent History Reviewed:  ?Reviewed past medical,surgical, social, obstetrical and family history.  ?Reviewed problem list, medications and allergies. ?Physical Assessment:   ? ?Vitals:  ? 05/05/21 0835 05/05/21 0840  ?BP: (!) 145/91 139/88  ?Pulse: (!) 102 (!) 107  ?Weight: 280 lb (127 kg)   ?Body mass index is 45.89 kg/m?. ?     ?     Physical Examination:  ? General appearance: alert, well appearing, and in no distress ? Mental status: alert, oriented to person, place, and time ? Skin: warm & dry  ? Extremities: Edema: Trace  ?  Cardiovascular: normal heart rate noted ? Respiratory: normal respiratory effort, no distress ? Abdomen: gravid, soft, non-tender ? Pelvic: Cervical exam performed  Dilation: Closed Effacement (%): Thick Station: -3 ? ?Fetal Status: Fetal Heart Rate (bpm): 140 Fundal Height: 35 cm Movement: Present Presentation: Vertex ? ?Fetal Surveillance Testing today: NST: FHR baseline 140 bpm, Variability: moderate, Accelerations:present, Decelerations:  Absent= Cat 1/reactive ?Toco: none   ? ?Chaperone: AGlenard HaringNeas   ? ?Results for orders placed or performed in visit on 05/05/21 (from the past 24 hour(s))  ?POC Urinalysis Dipstick OB  ? Collection Time: 05/05/21  8:39 AM  ?Result Value Ref Range  ? Color, UA    ? Clarity, UA    ? Glucose, UA Negative Negative  ? Bilirubin, UA    ? Ketones, UA neg   ? Spec Grav, UA    ? Blood, UA neg   ? pH, UA    ? POC,PROTEIN,UA Negative Negative, Trace, Small (1+), Moderate (  2+), Large (3+), 4+  ? Urobilinogen, UA    ? Nitrite, UA neg   ? Leukocytes, UA Negative Negative  ? Appearance    ? Odor    ?  ?Assessment & Plan:  ?High-risk pregnancy: G1P0000 at 58w3dwith an Estimated Date of Delivery: 05/30/21  ? ?1) GHTN, dx today, asymptomatic, no proteinuria, will check labs. IOL 3/21 @ [facility],  IOL form faxed and orders placed  ? ?Meds: No orders of the defined types were placed in this encounter. ? ? ?Labs/procedures today: GBS, GC/CT, SVE, and NST ? ?Treatment Plan:  IOL 3/21 ? ?Reviewed: Preterm labor symptoms and general obstetric precautions including but not limited to vaginal bleeding, contractions, leaking of fluid and fetal movement  were reviewed in detail with the patient.  All questions were answered. Does have home bp cuff. Office bp cuff given: not applicable. Check bp daily, let uKoreaknow if consistently >150 and/or >95. ? ?Follow-up: Return for cancel future appts. ? ? ?Future Appointments  ?Date Time Provider [place] ?05/09/2021 12:00 AM [facility] SCHED ROOM [facility] None  ? ? ?Orders Placed This Encounter  ?Procedures  ? Strep Gp B NAA  ? CBC  ? Comprehensive metabolic panel  ? Protein / creatinine ratio, urine  ? POC Urinalysis Dipstick OB  ? ?[place]rena [place]?05/05/2021 ?9:54 AM  ?

## 2021-05-06 ENCOUNTER — Other Ambulatory Visit: Payer: Self-pay | Admitting: Advanced Practice Midwife

## 2021-05-06 LAB — COMPREHENSIVE METABOLIC PANEL
ALT: 14 IU/L (ref 0–32)
AST: 16 IU/L (ref 0–40)
Albumin/Globulin Ratio: 1.3 (ref 1.2–2.2)
Albumin: 3.5 g/dL — ABNORMAL LOW (ref 3.8–4.8)
Alkaline Phosphatase: 163 IU/L — ABNORMAL HIGH (ref 44–121)
BUN/Creatinine Ratio: 10 (ref 9–23)
BUN: 6 mg/dL (ref 6–20)
Bilirubin Total: <0.2 mg/dL (ref 0.0–1.2)
CO2: 22 mmol/L (ref 20–29)
Calcium: 9.4 mg/dL (ref 8.7–10.2)
Chloride: 105 mmol/L (ref 96–106)
Creatinine, Ser: 0.6 mg/dL (ref 0.57–1.00)
Globulin, Total: 2.6 g/dL (ref 1.5–4.5)
Glucose: 71 mg/dL (ref 70–99)
Potassium: 4.7 mmol/L (ref 3.5–5.2)
Sodium: 138 mmol/L (ref 134–144)
Total Protein: 6.1 g/dL (ref 6.0–8.5)
eGFR: 122 mL/min/{1.73_m2} (ref >59)

## 2021-05-06 LAB — CBC
Hematocrit: 37.7 % (ref 34.0–46.6)
Hemoglobin: 12.2 g/dL (ref 11.1–15.9)
MCH: 27.4 pg (ref 26.6–33.0)
MCHC: 32.4 g/dL (ref 31.5–35.7)
MCV: 85 fL (ref 79–97)
Platelets: 324 10*3/uL (ref 150–450)
RBC: 4.46 x10E6/uL (ref 3.77–5.28)
RDW: 13.7 % (ref 11.7–15.4)
WBC: 18.5 10*3/uL — ABNORMAL HIGH (ref 3.4–10.8)

## 2021-05-06 LAB — PROTEIN / CREATININE RATIO, URINE
Creatinine, Urine: 116.2 mg/dL
Protein, Ur: 23 mg/dL
Protein/Creat Ratio: 198 mg/g creat (ref 0–200)

## 2021-05-07 LAB — STREP GP B NAA: Strep Gp B NAA: NEGATIVE

## 2021-05-08 ENCOUNTER — Encounter (HOSPITAL_COMMUNITY): Payer: Self-pay | Admitting: Obstetrics & Gynecology

## 2021-05-08 LAB — CERVICOVAGINAL ANCILLARY ONLY
Chlamydia: NEGATIVE
Comment: NEGATIVE
Comment: NORMAL
Neisseria Gonorrhea: NEGATIVE

## 2021-05-09 ENCOUNTER — Inpatient Hospital Stay (HOSPITAL_COMMUNITY): Payer: BC Managed Care – PPO

## 2021-05-09 ENCOUNTER — Inpatient Hospital Stay (HOSPITAL_COMMUNITY)
Admission: AD | Admit: 2021-05-09 | Discharge: 2021-05-12 | DRG: 788 | Disposition: A | Discharge status: Home / Self Care | Payer: BC Managed Care – PPO | Attending: Obstetrics and Gynecology | Admitting: Obstetrics and Gynecology

## 2021-05-09 ENCOUNTER — Other Ambulatory Visit: Payer: Self-pay

## 2021-05-09 ENCOUNTER — Encounter (HOSPITAL_COMMUNITY): Payer: Self-pay | Admitting: Obstetrics & Gynecology

## 2021-05-09 DIAGNOSIS — L301 Dyshidrosis [pompholyx]: Secondary | ICD-10-CM | POA: Diagnosis not present

## 2021-05-09 DIAGNOSIS — Z419 Encounter for procedure for purposes other than remedying health state, unspecified: Secondary | ICD-10-CM | POA: Diagnosis not present

## 2021-05-09 DIAGNOSIS — O99214 Obesity complicating childbirth: Secondary | ICD-10-CM | POA: Diagnosis present

## 2021-05-09 DIAGNOSIS — O99019 Anemia complicating pregnancy, unspecified trimester: Secondary | ICD-10-CM | POA: Diagnosis not present

## 2021-05-09 DIAGNOSIS — Z1389 Encounter for screening for other disorder: Secondary | ICD-10-CM | POA: Diagnosis not present

## 2021-05-09 DIAGNOSIS — Z3A37 37 weeks gestation of pregnancy: Secondary | ICD-10-CM | POA: Diagnosis not present

## 2021-05-09 DIAGNOSIS — Z9189 Other specified personal risk factors, not elsewhere classified: Secondary | ICD-10-CM | POA: Diagnosis not present

## 2021-05-09 DIAGNOSIS — H04552 Acquired stenosis of left nasolacrimal duct: Secondary | ICD-10-CM | POA: Diagnosis not present

## 2021-05-09 DIAGNOSIS — Z885 Allergy status to narcotic agent status: Secondary | ICD-10-CM | POA: Diagnosis not present

## 2021-05-09 DIAGNOSIS — Z00121 Encounter for routine child health examination with abnormal findings: Secondary | ICD-10-CM | POA: Diagnosis not present

## 2021-05-09 DIAGNOSIS — Z23 Encounter for immunization: Secondary | ICD-10-CM

## 2021-05-09 DIAGNOSIS — O134 Gestational [pregnancy-induced] hypertension without significant proteinuria, complicating childbirth: Principal | ICD-10-CM | POA: Diagnosis present

## 2021-05-09 DIAGNOSIS — Z6841 Body Mass Index (BMI) 40.0 and over, adult: Secondary | ICD-10-CM | POA: Diagnosis not present

## 2021-05-09 DIAGNOSIS — O1415 Severe pre-eclampsia, complicating the puerperium: Secondary | ICD-10-CM | POA: Diagnosis present

## 2021-05-09 DIAGNOSIS — R7309 Other abnormal glucose: Secondary | ICD-10-CM | POA: Diagnosis not present

## 2021-05-09 DIAGNOSIS — R011 Cardiac murmur, unspecified: Secondary | ICD-10-CM | POA: Diagnosis not present

## 2021-05-09 DIAGNOSIS — Z7982 Long term (current) use of aspirin: Secondary | ICD-10-CM | POA: Diagnosis not present

## 2021-05-09 DIAGNOSIS — Z00111 Health examination for newborn 8 to 28 days old: Secondary | ICD-10-CM | POA: Diagnosis not present

## 2021-05-09 DIAGNOSIS — L02426 Furuncle of left lower limb: Secondary | ICD-10-CM | POA: Diagnosis not present

## 2021-05-09 DIAGNOSIS — Z3403 Encounter for supervision of normal first pregnancy, third trimester: Principal | ICD-10-CM

## 2021-05-09 DIAGNOSIS — G4489 Other headache syndrome: Secondary | ICD-10-CM | POA: Diagnosis not present

## 2021-05-09 DIAGNOSIS — O1092 Unspecified pre-existing hypertension complicating childbirth: Secondary | ICD-10-CM | POA: Diagnosis not present

## 2021-05-09 DIAGNOSIS — B9689 Other specified bacterial agents as the cause of diseases classified elsewhere: Secondary | ICD-10-CM | POA: Diagnosis not present

## 2021-05-09 DIAGNOSIS — O139 Gestational [pregnancy-induced] hypertension without significant proteinuria, unspecified trimester: Secondary | ICD-10-CM | POA: Diagnosis present

## 2021-05-09 DIAGNOSIS — F3342 Major depressive disorder, recurrent, in full remission: Secondary | ICD-10-CM | POA: Diagnosis present

## 2021-05-09 DIAGNOSIS — Z98891 History of uterine scar from previous surgery: Secondary | ICD-10-CM

## 2021-05-09 DIAGNOSIS — Z87891 Personal history of nicotine dependence: Secondary | ICD-10-CM

## 2021-05-09 DIAGNOSIS — Z2839 Other underimmunization status: Secondary | ICD-10-CM

## 2021-05-09 DIAGNOSIS — O0990 Supervision of high risk pregnancy, unspecified, unspecified trimester: Secondary | ICD-10-CM

## 2021-05-09 DIAGNOSIS — R030 Elevated blood-pressure reading, without diagnosis of hypertension: Secondary | ICD-10-CM | POA: Diagnosis present

## 2021-05-09 DIAGNOSIS — D649 Anemia, unspecified: Secondary | ICD-10-CM | POA: Diagnosis not present

## 2021-05-09 DIAGNOSIS — E119 Type 2 diabetes mellitus without complications: Secondary | ICD-10-CM | POA: Diagnosis not present

## 2021-05-09 DIAGNOSIS — L308 Other specified dermatitis: Secondary | ICD-10-CM | POA: Diagnosis not present

## 2021-05-09 DIAGNOSIS — O9902 Anemia complicating childbirth: Secondary | ICD-10-CM | POA: Diagnosis present

## 2021-05-09 DIAGNOSIS — R519 Headache, unspecified: Secondary | ICD-10-CM | POA: Diagnosis not present

## 2021-05-09 DIAGNOSIS — G43109 Migraine with aura, not intractable, without status migrainosus: Secondary | ICD-10-CM | POA: Diagnosis not present

## 2021-05-09 DIAGNOSIS — R202 Paresthesia of skin: Secondary | ICD-10-CM | POA: Diagnosis not present

## 2021-05-09 DIAGNOSIS — Z789 Other specified health status: Secondary | ICD-10-CM | POA: Diagnosis not present

## 2021-05-09 DIAGNOSIS — O09899 Supervision of other high risk pregnancies, unspecified trimester: Secondary | ICD-10-CM

## 2021-05-09 DIAGNOSIS — Z0389 Encounter for observation for other suspected diseases and conditions ruled out: Secondary | ICD-10-CM | POA: Diagnosis not present

## 2021-05-09 DIAGNOSIS — J029 Acute pharyngitis, unspecified: Secondary | ICD-10-CM | POA: Diagnosis not present

## 2021-05-09 DIAGNOSIS — E039 Hypothyroidism, unspecified: Secondary | ICD-10-CM | POA: Diagnosis not present

## 2021-05-09 DIAGNOSIS — R42 Dizziness and giddiness: Secondary | ICD-10-CM | POA: Diagnosis not present

## 2021-05-09 DIAGNOSIS — Q381 Ankyloglossia: Secondary | ICD-10-CM | POA: Diagnosis not present

## 2021-05-09 DIAGNOSIS — Z3400 Encounter for supervision of normal first pregnancy, unspecified trimester: Secondary | ICD-10-CM

## 2021-05-09 HISTORY — DX: Gestational (pregnancy-induced) hypertension without significant proteinuria, unspecified trimester: O13.9

## 2021-05-09 HISTORY — DX: Premenstrual dysphoric disorder: F32.81

## 2021-05-09 HISTORY — DX: Severe pre-eclampsia, complicating the puerperium: O14.15

## 2021-05-09 HISTORY — DX: Benign lipomatous neoplasm of skin and subcutaneous tissue of trunk: D17.1

## 2021-05-09 HISTORY — DX: Major depressive disorder, single episode, unspecified: F32.9

## 2021-05-09 LAB — RPR: RPR Ser Ql: NONREACTIVE

## 2021-05-09 LAB — COMPREHENSIVE METABOLIC PANEL
ALT: 19 U/L (ref 0–44)
AST: 27 U/L (ref 15–41)
Albumin: 2.6 g/dL — ABNORMAL LOW (ref 3.5–5.0)
Alkaline Phosphatase: 127 U/L — ABNORMAL HIGH (ref 38–126)
Anion gap: 8 (ref 5–15)
BUN: 11 mg/dL (ref 6–20)
CO2: 24 mmol/L (ref 22–32)
Calcium: 9.4 mg/dL (ref 8.9–10.3)
Chloride: 104 mmol/L (ref 98–111)
Creatinine, Ser: 0.71 mg/dL (ref 0.44–1.00)
GFR, Estimated: >60 mL/min (ref >60)
Glucose, Bld: 99 mg/dL (ref 70–99)
Potassium: 4.1 mmol/L (ref 3.5–5.1)
Sodium: 136 mmol/L (ref 135–145)
Total Bilirubin: 0.4 mg/dL (ref 0.3–1.2)
Total Protein: 5.8 g/dL — ABNORMAL LOW (ref 6.5–8.1)

## 2021-05-09 LAB — TYPE AND SCREEN
ABO/RH(D): O POS
Antibody Screen: NEGATIVE

## 2021-05-09 LAB — URINALYSIS, ROUTINE W REFLEX MICROSCOPIC
Bacteria, UA: NONE SEEN
Bilirubin Urine: NEGATIVE
Glucose, UA: NEGATIVE mg/dL
Ketones, ur: NEGATIVE mg/dL
Leukocytes,Ua: NEGATIVE
Nitrite: NEGATIVE
Protein, ur: NEGATIVE mg/dL
Specific Gravity, Urine: 1.008 (ref 1.005–1.030)
pH: 7 (ref 5.0–8.0)

## 2021-05-09 LAB — CBC
HCT: 36.4 % (ref 36.0–46.0)
Hemoglobin: 11.8 g/dL — ABNORMAL LOW (ref 12.0–15.0)
MCH: 27.8 pg (ref 26.0–34.0)
MCHC: 32.4 g/dL (ref 30.0–36.0)
MCV: 85.6 fL (ref 80.0–100.0)
Platelets: 298 10*3/uL (ref 150–400)
RBC: 4.25 MIL/uL (ref 3.87–5.11)
RDW: 14.4 % (ref 11.5–15.5)
WBC: 16.4 10*3/uL — ABNORMAL HIGH (ref 4.0–10.5)
nRBC: 0 % (ref 0.0–0.2)

## 2021-05-09 LAB — PROTEIN / CREATININE RATIO, URINE
Creatinine, Urine: 80.38 mg/dL
Protein Creatinine Ratio: 0.1 mg/mg{Cre} (ref 0.00–0.15)
Total Protein, Urine: 8 mg/dL

## 2021-05-09 MED ORDER — TERBUTALINE SULFATE 1 MG/ML IJ SOLN
0.2500 mg | Freq: Once | INTRAMUSCULAR | Status: AC | PRN
Start: 2021-05-09 — End: 2021-05-10
  Administered 2021-05-10: 0.25 mg via SUBCUTANEOUS

## 2021-05-09 MED ORDER — HYDRALAZINE HCL 20 MG/ML IJ SOLN: 10.0000 mg | INTRAMUSCULAR | Status: DC | PRN

## 2021-05-09 MED ORDER — OXYTOCIN BOLUS FROM INFUSION: 333.0000 mL | Freq: Once | INTRAVENOUS | Status: DC

## 2021-05-09 MED ORDER — FLEET ENEMA 7-19 GM/118ML RE ENEM: 1.0000 | ENEMA | RECTAL | Status: DC | PRN

## 2021-05-09 MED ORDER — OXYTOCIN-SODIUM CHLORIDE 30-0.9 UT/500ML-% IV SOLN
1.0000 m[IU]/min | INTRAVENOUS | Status: DC
  Administered 2021-05-09: 2 m[IU]/min via INTRAVENOUS
  Filled 2021-05-09: qty 500

## 2021-05-09 MED ORDER — SOD CITRATE-CITRIC ACID 500-334 MG/5ML PO SOLN
30.0000 mL | ORAL | Status: DC | PRN
  Administered 2021-05-10: 30 mL via ORAL
  Filled 2021-05-09: qty 30

## 2021-05-09 MED ORDER — ACETAMINOPHEN 325 MG PO TABS: 650.0000 mg | ORAL_TABLET | ORAL | Status: DC | PRN

## 2021-05-09 MED ORDER — OXYTOCIN-SODIUM CHLORIDE 30-0.9 UT/500ML-% IV SOLN
2.5000 [IU]/h | INTRAVENOUS | Status: DC
Start: 2021-05-09 — End: 2021-05-10

## 2021-05-09 MED ORDER — LIDOCAINE HCL (PF) 1 % IJ SOLN: 30.0000 mL | INTRAMUSCULAR | Status: DC | PRN

## 2021-05-09 MED ORDER — LABETALOL HCL 5 MG/ML IV SOLN: 80.0000 mg | INTRAVENOUS | Status: DC | PRN

## 2021-05-09 MED ORDER — LABETALOL HCL 5 MG/ML IV SOLN: 40.0000 mg | INTRAVENOUS | Status: DC | PRN

## 2021-05-09 MED ORDER — HYDROXYZINE HCL 50 MG PO TABS
50.0000 mg | ORAL_TABLET | Freq: Four times a day (QID) | ORAL | Status: DC | PRN
  Administered 2021-05-09: 50 mg via ORAL
  Filled 2021-05-09: qty 1

## 2021-05-09 MED ORDER — MISOPROSTOL 50MCG HALF TABLET
50.0000 ug | ORAL_TABLET | ORAL | Status: DC | PRN
  Administered 2021-05-09: 50 ug via BUCCAL
  Filled 2021-05-09: qty 1

## 2021-05-09 MED ORDER — ZOLPIDEM TARTRATE 5 MG PO TABS
5.0000 mg | ORAL_TABLET | Freq: Every evening | ORAL | Status: DC | PRN
Start: 2021-05-09 — End: 2021-05-10
  Administered 2021-05-09: 5 mg via ORAL
  Filled 2021-05-09: qty 1

## 2021-05-09 MED ORDER — ONDANSETRON HCL 4 MG/2ML IJ SOLN
4.0000 mg | Freq: Four times a day (QID) | INTRAMUSCULAR | Status: DC | PRN
Start: 2021-05-09 — End: 2021-05-10
  Administered 2021-05-10: 4 mg via INTRAVENOUS
  Filled 2021-05-09: qty 2

## 2021-05-09 MED ORDER — LACTATED RINGERS IV SOLN
500.0000 mL | INTRAVENOUS | Status: DC | PRN
  Administered 2021-05-09: 1000 mL via INTRAVENOUS
  Administered 2021-05-10: 999 mL via INTRAVENOUS

## 2021-05-09 MED ORDER — MISOPROSTOL 25 MCG QUARTER TABLET
25.0000 ug | ORAL_TABLET | ORAL | Status: DC | PRN
  Administered 2021-05-09 (×2): 25 ug via VAGINAL
  Filled 2021-05-09 (×2): qty 1

## 2021-05-09 MED ORDER — TERBUTALINE SULFATE 1 MG/ML IJ SOLN
0.2500 mg | Freq: Once | INTRAMUSCULAR | Status: AC | PRN
  Administered 2021-05-10: 0.25 mg via SUBCUTANEOUS
  Filled 2021-05-09: qty 1

## 2021-05-09 MED ORDER — FENTANYL CITRATE (PF) 100 MCG/2ML IJ SOLN
100.0000 ug | INTRAMUSCULAR | Status: DC | PRN
  Administered 2021-05-09 – 2021-05-10 (×3): 100 ug via INTRAVENOUS
  Filled 2021-05-09 (×4): qty 2

## 2021-05-09 MED ORDER — LABETALOL HCL 5 MG/ML IV SOLN
20.0000 mg | INTRAVENOUS | Status: DC | PRN
Start: 2021-05-09 — End: 2021-05-10

## 2021-05-09 MED ORDER — LACTATED RINGERS IV SOLN
INTRAVENOUS | Status: DC
  Administered 2021-05-10 (×2): 125 mL via INTRAVENOUS

## 2021-05-09 NOTE — Progress Notes (Signed)
Patient ID: Megan Houston, female   DOB: [birth year], 31 y.o.   MRN: [id number] ? ?Patient doing well. ?BP (!) 150/86   Pulse 99   Temp 98 ?F (36.7 ?C) (Oral)   Resp 18   Ht 5' 5.5" (1.664 m)   Wt 129 kg   LMP 08/23/2020   BMI 46.61 kg/m?  ? ?Dilation: 1 ?Effacement (%): Thick ?Cervical Position: Anterior ?Station: -3 ?Presentation: Vertex ?Exam by:: Dr Nehemiah Settle ? ?Foley balloon placed ?Oral cytotec until balloon out, then start pitocin. ? ?Truett Mainland, DO ? ?

## 2021-05-09 NOTE — Progress Notes (Signed)
Patient ID: Megan Houston, female   DOB: [birth year], 31 y.o.   MRN: [id number] ? ?Foley balloon out at 1500. Patient doing well. ? ?BP 139/85   Pulse 90   Temp 97.9 ?F (36.6 ?C) (Oral)   Resp 18   Ht 5' 5.5" (1.664 m)   Wt 129 kg   LMP 08/23/2020   BMI 46.61 kg/m?  ? ?Dilation: 4 ?Effacement (%): Thick ?Cervical Position: Anterior ?Station: -3 ?Presentation: Vertex ?Exam by:: Megan Greathouse, RN ? ?Start pitocin ? ?Truett Mainland, DO ? ?

## 2021-05-09 NOTE — Progress Notes (Signed)
Patient Vitals for the past 4 hrs: ? BP Temp Temp src Pulse Resp  ?05/09/21 0629 134/80 98 ?F (36.7 ?C) Oral 91 18  ?05/09/21 0610 -- -- -- -- 18  ? ?Pt got 2nd cytotec about an hour ago.  Frustrated because she is so tired, ambien didn't help at all.  Will try to help her get a good nap after she eats breakfast w/IV fentanyl.  Pt very agreeable to this. FHR Cat 1.  ?

## 2021-05-09 NOTE — Progress Notes (Signed)
Patient ID: Megan Houston, female   DOB: 09/18/1990, 31 y.o.   MRN: [id number] [name] is a 31 y.o. G1P0000 33w0dadmitted for induction of labor due to gestational hypertension  ? ?Subjective: ?feeling contractions some, but not uncomfortable and denies headache, visual changes, ruq/epigastric pain, n/v ? ?Objective: ?BP 120/80 (BP Location: Right Arm)   Pulse 86   Temp 98.3 ?F (36.8 ?C) (Oral)   Resp 18   Ht 5' 5.5" (1.664 m)   Wt 129 kg   LMP 08/23/2020   SpO2 99%   BMI 46.61 kg/m?  ?No intake/output data recorded. ? ?FHR baseline 135 bpm, Variability: moderate, Accelerations:present, Decelerations:  Absent ?Toco: irregular  ? ?SVE:   Dilation: 4 ?Effacement (%): 50 ?Station: -3 ?Exam by:: Megan Houston ? ?Pitocin @ 10 mu/min ? ?Labs: ?Lab Results  ?Component Value Date  ? WBC 16.4 (H) 05/09/2021  ? HGB 11.8 (L) 05/09/2021  ? HCT 36.4 05/09/2021  ? MCV 85.6 05/09/2021  ? PLT 298 05/09/2021  ? ? ?Assessment / Plan: ?IOL d/t GHTN, s/p cytotec x3 and foley bulb, pitocin started at 1630, continue to increase to achieve adequate labor, plan AROM when vtx lower ? ?Labor: early ?Fetal Wellbeing:  Category I ?Pain Control:  labor support without medications ?Pre-eclampsia: asymptomatic, bp's stable, and labs stable ?I/D:  GBS neg ?Anticipated MOD: NSVB ? ?Megan Houston ?05/09/2021, 9:02 PM  ?

## 2021-05-09 NOTE — H&P (Addendum)
OBSTETRIC ADMISSION HISTORY AND PHYSICAL ? ?Megan Houston is a 31 y.o. female G1P0000 with IUP at 16w0dby LMP presenting for IOL for gHTN. She reports +FMs, No LOF, no VB, no blurry vision, headaches or peripheral edema, and RUQ pain.  She plans on breast feeding. She request withdrawal method for birth control. ?[facility] ? ?Dating: By LMP --->  Estimated Date of Delivery: 05/30/21 ? ?Sono:   ? ?'@[redacted]w[redacted]d'$ , CWD, normal anatomy, Cephalic presentation, anterior placental lie, 424g, 68% EFW ? ? ?Prenatal History/Complications:  ?Gestational Hypertension ?MDD ?Rubella non-immune ? ? ?Past Medical History: ?Past Medical History:  ?Diagnosis Date  ? Anxiety   ? Asthma   ? Bipolar disorder ([place]   ? Depression   ? Heart murmur   ? Lipoma of back   ? MDD (major depressive disorder)   ? PMDD (premenstrual dysphoric disorder)   ? Pregnancy induced hypertension   ? ? ?Past Surgical History: ?Past Surgical History:  ?Procedure Laterality Date  ? LIPOMA EXCISION N/A 01/01/2020  ? Procedure: EXCISION of LIPOMA from BACK;  Surgeon: JAviva Signs MD;  Location: [facility];  Service: General;  Laterality: N/A;  ? ? ?Obstetrical History: ?OB History   ? ? Gravida  ?1  ? Para  ?0  ? Term  ?0  ? Preterm  ?0  ? AB  ?0  ? Living  ?0  ?  ? ? SAB  ?0  ? IAB  ?0  ? Ectopic  ?0  ? Multiple  ?0  ? Live Births  ?0  ?   ?  ?  ? ? ?Social History ?Social History  ? ?Socioeconomic History  ? Marital status: Married  ?  Spouse name: Not on file  ? Number of children: 0  ? Years of education: Not on file  ? Highest education level: Some college, no degree  ?Occupational History  ? Not on file  ?Tobacco Use  ? Smoking status: Former  ?  Packs/day: 0.25  ?  Years: 4.00  ?  Pack years: 1.00  ?  Types: Cigarettes  ?  Quit date: 05/27/2014  ?  Years since quitting: 6.9  ? Smokeless tobacco: Never  ?Vaping Use  ? Vaping Use: Never used  ?Substance and Sexual Activity  ? Alcohol use: Not Currently  ?  Comment: rarely  ?  Drug use: No  ? Sexual activity: Yes  ?  Birth control/protection: None  ?Other Topics Concern  ? Not on file  ?Social History Narrative  ? Not on file  ? ?Social Determinants of Health  ? ?Financial Resource Strain: Low Risk   ? Difficulty of Paying Living Expenses: Not very hard  ?Food Insecurity: No Food Insecurity  ? Worried About RCharity fundraiserin the Last Year: Never true  ? Ran Out of Food in the Last Year: Never true  ?Transportation Needs: No Transportation Needs  ? Lack of Transportation (Medical): No  ? Lack of Transportation (Non-Medical): No  ?Physical Activity: Insufficiently Active  ? Days of Exercise per Week: 3 days  ? Minutes of Exercise per Session: 20 min  ?Stress: No Stress Concern Present  ? Feeling of Stress : Only a little  ?Social Connections: Moderately Isolated  ? Frequency of Communication with Friends and Family: Twice a week  ? Frequency of Social Gatherings with Friends and Family: Once a week  ? Attends Religious Services: Never  ? Active Member of Clubs or Organizations:  No  ? Attends Archivist Meetings: Never  ? Marital Status: Married  ? ? ?Family History: ?Family History  ?Problem Relation Age of Onset  ? Diabetes Mother   ? Hypertension Mother   ? Bipolar disorder Mother   ? Alcohol abuse Mother   ? Drug abuse Mother   ? Cancer Mother   ?     cervical  ? OCD Father   ? Anxiety disorder Father   ? Diabetes Father   ? Hypertension Father   ? Bipolar disorder Sister   ? Drug abuse Sister   ? Alcohol abuse Sister   ? Hypertension Paternal Aunt   ? Diabetes Paternal Aunt   ? Diabetes Maternal Grandmother   ? Bipolar disorder Maternal Grandmother   ? Anxiety disorder Maternal Grandmother   ? Schizophrenia Maternal Grandmother   ? Liver disease Paternal Grandmother   ? Hypertension Other   ? Diabetes Other   ? ? ?Allergies: ?Allergies  ?Allergen Reactions  ? Codeine Nausea And Vomiting  ? Hydrocodone-Acetaminophen Nausea And Vomiting  ? ? ?Medications Prior to Admission   ?Medication Sig Dispense Refill Last Dose  ? aspirin 81 MG EC tablet Take 1 tablet (81 mg total) by mouth daily. Swallow whole. 90 tablet 3 Past Week  ? esomeprazole (NEXIUM) 20 MG capsule Take 20 mg by mouth daily at 12 noon.   05/08/2021 at 0830  ? MAGNESIUM PO Take by mouth.   05/08/2021 at 0830  ? Prenatal Vit-Fe Fumarate-FA (PRENATAL MULTIVITAMIN) TABS tablet Take 1 tablet by mouth daily at 12 noon.   05/08/2021 at 0830  ? ? ? ?Review of Systems  ? ?All systems reviewed and negative except as stated in HPI ? ?Blood pressure 126/73, pulse 92, temperature 97.8 ?F (36.6 ?C), temperature source Oral, resp. rate 18, height 5' 5.5" (1.664 m), weight 129 kg, last menstrual period 08/23/2020. ?General appearance: alert, cooperative, and appears stated age ?Lungs: easy work of breathing on room air ?Heart: normal rate, warm extremities ?Abdomen: soft, non-tender; gravid ?Extremities: no lower extremity edema ?Presentation: cephalic ?Fetal monitoring: Baseline: 130 bpm, Variability: Good {> 6 bpm), Accelerations: Reactive, and Decelerations: Absent ?Uterine activity: Occasional  ?Dilation: 1 ?Effacement (%): Thick ?Station: -3, -2 ?Exam by:: MScott, RN ? ? ?Prenatal labs: ?ABO, Rh: --/--/O POS (03/21 0130) ?Antibody: NEG (03/21 0130) ?Rubella: 0.91 (09/27 1121) ?RPR: Non Reactive (01/18 0813)  ?HBsAg: Negative (09/27 1121)  ?HIV: Non Reactive (01/18 0813)  ?GBS: Negative/-- (03/17 1515)  ?2 hr Glucola normal ?Genetic screening  NT/IT negative, Panorama: low-risk female, carrier screen negative, [place]/Hgb Elec negative ?Anatomy US normal ? ?Prenatal Transfer Tool  ?Maternal Diabetes: No ?Genetic Screening: Normal ?Maternal Ultrasounds/Referrals: Normal ?Fetal Ultrasounds or other Referrals:  None ?Maternal Substance Abuse:  No ?Significant Maternal Medications:  Meds include: Nexium ?Significant Maternal Lab Results: Group B Strep negative ? ?Results for orders placed or performed during the hospital encounter of 05/09/21 (from  the past 24 hour(s))  ?CBC  ? Collection Time: 05/09/21  1:30 AM  ?Result Value Ref Range  ? WBC 16.4 (H) 4.0 - 10.5 K/uL  ? RBC 4.25 3.87 - 5.11 MIL/uL  ? Hemoglobin 11.8 (L) 12.0 - 15.0 g/dL  ? HCT 36.4 36.0 - 46.0 %  ? MCV 85.6 80.0 - 100.0 fL  ? MCH 27.8 26.0 - 34.0 pg  ? MCHC 32.4 30.0 - 36.0 g/dL  ? RDW 14.4 11.5 - 15.5 %  ? Platelets 298 150 - 400 K/uL  ? nRBC 0.0  0.0 - 0.2 %  ?Comprehensive metabolic panel  ? Collection Time: 05/09/21  1:30 AM  ?Result Value Ref Range  ? Sodium 136 135 - 145 mmol/L  ? Potassium 4.1 3.5 - 5.1 mmol/L  ? Chloride 104 98 - 111 mmol/L  ? CO2 24 22 - 32 mmol/L  ? Glucose, Bld 99 70 - 99 mg/dL  ? BUN 11 6 - 20 mg/dL  ? Creatinine, Ser 0.71 0.44 - 1.00 mg/dL  ? Calcium 9.4 8.9 - 10.3 mg/dL  ? Total Protein 5.8 (L) 6.5 - 8.1 g/dL  ? Albumin 2.6 (L) 3.5 - 5.0 g/dL  ? AST 27 15 - 41 U/L  ? ALT 19 0 - 44 U/L  ? Alkaline Phosphatase 127 (H) 38 - 126 U/L  ? Total Bilirubin 0.4 0.3 - 1.2 mg/dL  ? GFR, Estimated >60 >60 mL/min  ? Anion gap 8 5 - 15  ?Type and screen  ? Collection Time: 05/09/21  1:30 AM  ?Result Value Ref Range  ? ABO/RH(D) O POS   ? Antibody Screen NEG   ? Sample Expiration    ?  05/12/2021,2359 ?Performed at [facility], [place] [address] [place], [place] [address] ?  ? ? ?Patient Active Problem List  ? Diagnosis Date Noted  ? Gestational hypertension 05/09/2021  ? Asymptomatic bacteriuria 11/21/2020  ? Rubella non-immune status, antepartum 11/17/2020  ? Supervision of normal first pregnancy 11/15/2020  ? Lipoma of back   ? MDD (major depressive disorder), recurrent, in full remission ([place]) 02/02/2019  ? Anxiety 12/15/2018  ? PMDD (premenstrual dysphoric disorder) 09/16/2018  ? CARDIAC MURMUR 01/04/2010  ? ELEVATED BP READING WITHOUT DX HYPERTENSION 01/04/2010  ? ? ?Assessment/Plan:  ?Megan Houston is a 31 y.o. G1P0000 at 37w0dhere for IOL for gHTN. ? ?#Labor: Beginning induction with Cytotec x 1 at 0200. Will plan for recheck at 0600 and consider foley balloon  at that time. ?#Pain: Medications PRN, considering epidural ?#FWB: Category I  ?#ID:  GBS Negative ?#MOF: Breast ?#MOC: Withdrawal method ?#Circ:  Yes ? ?#Gestational Hypertension: Asymptomatic. Bps mild

## 2021-05-10 ENCOUNTER — Encounter (HOSPITAL_COMMUNITY): Payer: Self-pay | Admitting: Obstetrics & Gynecology

## 2021-05-10 ENCOUNTER — Inpatient Hospital Stay (HOSPITAL_COMMUNITY): Payer: BC Managed Care – PPO | Admitting: Anesthesiology

## 2021-05-10 ENCOUNTER — Encounter (HOSPITAL_COMMUNITY)
Admission: AD | Disposition: A | Discharge status: Home / Self Care | Payer: Self-pay | Source: Home / Self Care | Attending: Obstetrics and Gynecology

## 2021-05-10 ENCOUNTER — Other Ambulatory Visit: Payer: Self-pay

## 2021-05-10 DIAGNOSIS — O134 Gestational [pregnancy-induced] hypertension without significant proteinuria, complicating childbirth: Secondary | ICD-10-CM | POA: Diagnosis not present

## 2021-05-10 DIAGNOSIS — Z3A37 37 weeks gestation of pregnancy: Secondary | ICD-10-CM | POA: Diagnosis not present

## 2021-05-10 LAB — CBC WITH DIFFERENTIAL/PLATELET
Abs Immature Granulocytes: 0.2 10*3/uL — ABNORMAL HIGH (ref 0.00–0.07)
Basophils Absolute: 0 10*3/uL (ref 0.0–0.1)
Basophils Relative: 0 %
Eosinophils Absolute: 0 10*3/uL (ref 0.0–0.5)
Eosinophils Relative: 0 %
HCT: 33.7 % — ABNORMAL LOW (ref 36.0–46.0)
Hemoglobin: 11 g/dL — ABNORMAL LOW (ref 12.0–15.0)
Immature Granulocytes: 1 %
Lymphocytes Relative: 2 %
Lymphs Abs: 0.5 10*3/uL — ABNORMAL LOW (ref 0.7–4.0)
MCH: 28.2 pg (ref 26.0–34.0)
MCHC: 32.6 g/dL (ref 30.0–36.0)
MCV: 86.4 fL (ref 80.0–100.0)
Monocytes Absolute: 0.4 10*3/uL (ref 0.1–1.0)
Monocytes Relative: 2 %
Neutro Abs: 23.9 10*3/uL — ABNORMAL HIGH (ref 1.7–7.7)
Neutrophils Relative %: 95 %
Platelets: 274 10*3/uL (ref 150–400)
RBC: 3.9 MIL/uL (ref 3.87–5.11)
RDW: 14.2 % (ref 11.5–15.5)
WBC: 25 10*3/uL — ABNORMAL HIGH (ref 4.0–10.5)
nRBC: 0 % (ref 0.0–0.2)

## 2021-05-10 LAB — CBC
HCT: 35.2 % — ABNORMAL LOW (ref 36.0–46.0)
Hemoglobin: 11.7 g/dL — ABNORMAL LOW (ref 12.0–15.0)
MCH: 28.5 pg (ref 26.0–34.0)
MCHC: 33.2 g/dL (ref 30.0–36.0)
MCV: 85.6 fL (ref 80.0–100.0)
Platelets: 243 10*3/uL (ref 150–400)
RBC: 4.11 MIL/uL (ref 3.87–5.11)
RDW: 14.3 % (ref 11.5–15.5)
WBC: 21.2 10*3/uL — ABNORMAL HIGH (ref 4.0–10.5)
nRBC: 0 % (ref 0.0–0.2)

## 2021-05-10 SURGERY — Surgical Case
Anesthesia: Epidural

## 2021-05-10 MED ORDER — EPHEDRINE 5 MG/ML INJ: 10.0000 mg | INTRAVENOUS | Status: DC | PRN

## 2021-05-10 MED ORDER — ENOXAPARIN SODIUM 60 MG/0.6ML IJ SOSY
60.0000 mg | PREFILLED_SYRINGE | INTRAMUSCULAR | Status: DC
  Administered 2021-05-11 – 2021-05-12 (×2): 60 mg via SUBCUTANEOUS
  Filled 2021-05-10 (×2): qty 0.6

## 2021-05-10 MED ORDER — TERBUTALINE SULFATE 1 MG/ML IJ SOLN
0.2500 mg | Freq: Once | INTRAMUSCULAR | Status: AC
  Administered 2021-05-10: 0.25 mg via SUBCUTANEOUS

## 2021-05-10 MED ORDER — FUROSEMIDE 20 MG PO TABS
20.0000 mg | ORAL_TABLET | Freq: Every day | ORAL | Status: DC
  Administered 2021-05-11 – 2021-05-12 (×2): 20 mg via ORAL
  Filled 2021-05-10 (×2): qty 1

## 2021-05-10 MED ORDER — ZOLPIDEM TARTRATE 5 MG PO TABS: 5.0000 mg | ORAL_TABLET | Freq: Every evening | ORAL | Status: DC | PRN

## 2021-05-10 MED ORDER — ACETAMINOPHEN 10 MG/ML IV SOLN
INTRAVENOUS | Status: DC | PRN
  Administered 2021-05-10: 1000 mg via INTRAVENOUS

## 2021-05-10 MED ORDER — KETOROLAC TROMETHAMINE 30 MG/ML IJ SOLN
30.0000 mg | Freq: Four times a day (QID) | INTRAMUSCULAR | Status: DC | PRN
  Administered 2021-05-10: 30 mg via INTRAVENOUS

## 2021-05-10 MED ORDER — DEXAMETHASONE SODIUM PHOSPHATE 10 MG/ML IJ SOLN
INTRAMUSCULAR | Status: AC
  Filled 2021-05-10: qty 1

## 2021-05-10 MED ORDER — ACETAMINOPHEN 500 MG PO TABS
1000.0000 mg | ORAL_TABLET | Freq: Four times a day (QID) | ORAL | Status: DC
  Filled 2021-05-10: qty 2

## 2021-05-10 MED ORDER — SUCCINYLCHOLINE CHLORIDE 200 MG/10ML IV SOSY
PREFILLED_SYRINGE | INTRAVENOUS | Status: DC | PRN
  Administered 2021-05-10: 120 mg via INTRAVENOUS

## 2021-05-10 MED ORDER — MIDAZOLAM HCL 2 MG/2ML IJ SOLN
INTRAMUSCULAR | Status: AC
  Filled 2021-05-10: qty 2

## 2021-05-10 MED ORDER — FENTANYL CITRATE (PF) 250 MCG/5ML IJ SOLN
INTRAMUSCULAR | Status: DC | PRN
  Administered 2021-05-10: 250 ug via INTRAVENOUS

## 2021-05-10 MED ORDER — ONDANSETRON HCL 4 MG/2ML IJ SOLN: 4.0000 mg | Freq: Three times a day (TID) | INTRAMUSCULAR | Status: DC | PRN

## 2021-05-10 MED ORDER — SIMETHICONE 80 MG PO CHEW
80.0000 mg | CHEWABLE_TABLET | Freq: Three times a day (TID) | ORAL | Status: DC
  Administered 2021-05-10 – 2021-05-12 (×4): 80 mg via ORAL
  Filled 2021-05-10 (×4): qty 1

## 2021-05-10 MED ORDER — NALOXONE HCL 0.4 MG/ML IJ SOLN: 0.4000 mg | INTRAMUSCULAR | Status: DC | PRN

## 2021-05-10 MED ORDER — MEPERIDINE HCL 25 MG/ML IJ SOLN
INTRAMUSCULAR | Status: AC
Start: 2021-05-10 — End: ?
  Filled 2021-05-10: qty 1

## 2021-05-10 MED ORDER — IBUPROFEN 600 MG PO TABS
600.0000 mg | ORAL_TABLET | Freq: Four times a day (QID) | ORAL | Status: DC
  Administered 2021-05-10 – 2021-05-12 (×8): 600 mg via ORAL
  Filled 2021-05-10 (×8): qty 1

## 2021-05-10 MED ORDER — KETOROLAC TROMETHAMINE 30 MG/ML IJ SOLN: 30.0000 mg | Freq: Four times a day (QID) | INTRAMUSCULAR | Status: DC | PRN

## 2021-05-10 MED ORDER — LACTATED RINGERS AMNIOINFUSION
INTRAVENOUS | Status: DC
Start: 2021-05-10 — End: 2021-05-10
  Administered 2021-05-10: 150 mL via INTRAUTERINE

## 2021-05-10 MED ORDER — KETOROLAC TROMETHAMINE 30 MG/ML IJ SOLN
INTRAMUSCULAR | Status: AC
  Filled 2021-05-10: qty 1

## 2021-05-10 MED ORDER — SODIUM CHLORIDE 0.9 % IR SOLN
Status: DC | PRN
  Administered 2021-05-10: 1

## 2021-05-10 MED ORDER — EPHEDRINE 5 MG/ML INJ
10.0000 mg | INTRAVENOUS | Status: DC | PRN
  Administered 2021-05-10: 10 mg via INTRAVENOUS
  Filled 2021-05-10: qty 5

## 2021-05-10 MED ORDER — METOCLOPRAMIDE HCL 5 MG/ML IJ SOLN
INTRAMUSCULAR | Status: AC
  Filled 2021-05-10: qty 2

## 2021-05-10 MED ORDER — PROPOFOL 10 MG/ML IV BOLUS
INTRAVENOUS | Status: AC
  Filled 2021-05-10: qty 20

## 2021-05-10 MED ORDER — SODIUM CHLORIDE 0.9% FLUSH: 3.0000 mL | INTRAVENOUS | Status: DC | PRN

## 2021-05-10 MED ORDER — PHENYLEPHRINE 40 MCG/ML (10ML) SYRINGE FOR IV PUSH (FOR BLOOD PRESSURE SUPPORT)
80.0000 ug | PREFILLED_SYRINGE | INTRAVENOUS | Status: DC | PRN
  Administered 2021-05-10: 80 ug via INTRAVENOUS
  Filled 2021-05-10: qty 10

## 2021-05-10 MED ORDER — DIPHENHYDRAMINE HCL 25 MG PO CAPS: 25.0000 mg | ORAL_CAPSULE | ORAL | Status: DC | PRN

## 2021-05-10 MED ORDER — SCOPOLAMINE 1 MG/3DAYS TD PT72
1.0000 | MEDICATED_PATCH | Freq: Once | TRANSDERMAL | Status: DC
  Administered 2021-05-10: 1.5 mg via TRANSDERMAL

## 2021-05-10 MED ORDER — OXYTOCIN-SODIUM CHLORIDE 30-0.9 UT/500ML-% IV SOLN
2.5000 [IU]/h | INTRAVENOUS | Status: AC
  Administered 2021-05-10: 2.5 [IU]/h via INTRAVENOUS
  Filled 2021-05-10: qty 500

## 2021-05-10 MED ORDER — DIPHENHYDRAMINE HCL 50 MG/ML IJ SOLN: 12.5000 mg | INTRAMUSCULAR | Status: DC | PRN

## 2021-05-10 MED ORDER — LIDOCAINE-EPINEPHRINE (PF) 2 %-1:200000 IJ SOLN
INTRAMUSCULAR | Status: DC | PRN
Start: 2021-05-10 — End: 2021-05-10
  Administered 2021-05-10: 5 mL via EPIDURAL

## 2021-05-10 MED ORDER — WITCH HAZEL-GLYCERIN EX PADS: 1.0000 "application " | MEDICATED_PAD | CUTANEOUS | Status: DC | PRN

## 2021-05-10 MED ORDER — DEXTROSE 5 % IV SOLN
INTRAVENOUS | Status: DC | PRN
  Administered 2021-05-10: 3 g via INTRAVENOUS

## 2021-05-10 MED ORDER — MORPHINE SULFATE (PF) 0.5 MG/ML IJ SOLN
INTRAMUSCULAR | Status: AC
  Filled 2021-05-10: qty 10

## 2021-05-10 MED ORDER — DIPHENHYDRAMINE HCL 25 MG PO CAPS: 25.0000 mg | ORAL_CAPSULE | Freq: Four times a day (QID) | ORAL | Status: DC | PRN

## 2021-05-10 MED ORDER — SENNOSIDES-DOCUSATE SODIUM 8.6-50 MG PO TABS
2.0000 | ORAL_TABLET | ORAL | Status: DC
  Administered 2021-05-11: 2 via ORAL
  Filled 2021-05-10: qty 2

## 2021-05-10 MED ORDER — ONDANSETRON HCL 4 MG/2ML IJ SOLN
INTRAMUSCULAR | Status: AC
  Filled 2021-05-10: qty 2

## 2021-05-10 MED ORDER — MEASLES, MUMPS & RUBELLA VAC IJ SOLR
0.5000 mL | Freq: Once | INTRAMUSCULAR | Status: AC
Start: 2021-05-11 — End: 2021-05-12
  Administered 2021-05-12: 0.5 mL via SUBCUTANEOUS
  Filled 2021-05-10: qty 0.5

## 2021-05-10 MED ORDER — DIBUCAINE (PERIANAL) 1 % EX OINT: 1.0000 "application " | TOPICAL_OINTMENT | CUTANEOUS | Status: DC | PRN

## 2021-05-10 MED ORDER — FENTANYL-BUPIVACAINE-NACL 0.5-0.125-0.9 MG/250ML-% EP SOLN
12.0000 mL/h | EPIDURAL | Status: DC | PRN
  Administered 2021-05-10: 12 mL/h via EPIDURAL
  Filled 2021-05-10: qty 250

## 2021-05-10 MED ORDER — MENTHOL 3 MG MT LOZG
1.0000 | LOZENGE | OROMUCOSAL | Status: DC | PRN
  Administered 2021-05-11: 3 mg via ORAL
  Filled 2021-05-10 (×2): qty 9

## 2021-05-10 MED ORDER — MIDAZOLAM HCL 2 MG/2ML IJ SOLN
INTRAMUSCULAR | Status: DC | PRN
  Administered 2021-05-10: 2 mg via INTRAVENOUS

## 2021-05-10 MED ORDER — LIDOCAINE HCL (PF) 1 % IJ SOLN
INTRAMUSCULAR | Status: DC | PRN
  Administered 2021-05-10 (×2): 5 mL via EPIDURAL

## 2021-05-10 MED ORDER — FENTANYL CITRATE (PF) 250 MCG/5ML IJ SOLN
INTRAMUSCULAR | Status: AC
  Filled 2021-05-10: qty 5

## 2021-05-10 MED ORDER — SCOPOLAMINE 1 MG/3DAYS TD PT72
MEDICATED_PATCH | TRANSDERMAL | Status: AC
  Filled 2021-05-10: qty 1

## 2021-05-10 MED ORDER — LACTATED RINGERS IV SOLN: INTRAVENOUS | Status: DC | PRN

## 2021-05-10 MED ORDER — OXYCODONE HCL 5 MG PO TABS: 5.0000 mg | ORAL_TABLET | ORAL | Status: DC | PRN

## 2021-05-10 MED ORDER — STERILE WATER FOR IRRIGATION IR SOLN
Status: DC | PRN
  Administered 2021-05-10: 1

## 2021-05-10 MED ORDER — PROPOFOL 10 MG/ML IV BOLUS
INTRAVENOUS | Status: DC | PRN
  Administered 2021-05-10: 150 mg via INTRAVENOUS

## 2021-05-10 MED ORDER — ONDANSETRON HCL 4 MG/2ML IJ SOLN: 4.0000 mg | Freq: Once | INTRAMUSCULAR | Status: DC | PRN

## 2021-05-10 MED ORDER — LACTATED RINGERS IV SOLN: INTRAVENOUS | Status: DC

## 2021-05-10 MED ORDER — PHENYLEPHRINE 40 MCG/ML (10ML) SYRINGE FOR IV PUSH (FOR BLOOD PRESSURE SUPPORT)
80.0000 ug | PREFILLED_SYRINGE | INTRAVENOUS | Status: AC | PRN
  Administered 2021-05-10 (×3): 80 ug via INTRAVENOUS

## 2021-05-10 MED ORDER — SUCCINYLCHOLINE CHLORIDE 200 MG/10ML IV SOSY
PREFILLED_SYRINGE | INTRAVENOUS | Status: AC
  Filled 2021-05-10: qty 10

## 2021-05-10 MED ORDER — DEXAMETHASONE SODIUM PHOSPHATE 10 MG/ML IJ SOLN
INTRAMUSCULAR | Status: DC | PRN
  Administered 2021-05-10: 10 mg via INTRAVENOUS

## 2021-05-10 MED ORDER — ACETAMINOPHEN 500 MG PO TABS
1000.0000 mg | ORAL_TABLET | Freq: Four times a day (QID) | ORAL | Status: DC
  Administered 2021-05-10 – 2021-05-12 (×8): 1000 mg via ORAL
  Filled 2021-05-10 (×7): qty 2

## 2021-05-10 MED ORDER — ONDANSETRON HCL 4 MG/2ML IJ SOLN
INTRAMUSCULAR | Status: DC | PRN
  Administered 2021-05-10: 4 mg via INTRAMUSCULAR

## 2021-05-10 MED ORDER — TERBUTALINE SULFATE 1 MG/ML IJ SOLN
INTRAMUSCULAR | Status: AC
  Filled 2021-05-10: qty 1

## 2021-05-10 MED ORDER — COCONUT OIL OIL
1.0000 "application " | TOPICAL_OIL | Status: DC | PRN
  Administered 2021-05-11: 1 via TOPICAL

## 2021-05-10 MED ORDER — SIMETHICONE 80 MG PO CHEW
80.0000 mg | CHEWABLE_TABLET | ORAL | Status: DC | PRN
  Administered 2021-05-11: 80 mg via ORAL

## 2021-05-10 MED ORDER — OXYTOCIN-SODIUM CHLORIDE 30-0.9 UT/500ML-% IV SOLN
INTRAVENOUS | Status: DC | PRN
  Administered 2021-05-10: 400 mL via INTRAVENOUS

## 2021-05-10 MED ORDER — PRENATAL MULTIVITAMIN CH
1.0000 | ORAL_TABLET | Freq: Every day | ORAL | Status: DC
  Administered 2021-05-11 – 2021-05-12 (×2): 1 via ORAL
  Filled 2021-05-10 (×2): qty 1

## 2021-05-10 MED ORDER — NALOXONE HCL 4 MG/10ML IJ SOLN
1.0000 ug/kg/h | INTRAVENOUS | Status: DC | PRN
  Filled 2021-05-10: qty 5

## 2021-05-10 MED ORDER — MEPERIDINE HCL 25 MG/ML IJ SOLN
6.2500 mg | INTRAMUSCULAR | Status: DC | PRN
  Administered 2021-05-10: 6.25 mg via INTRAVENOUS

## 2021-05-10 MED ORDER — LACTATED RINGERS IV SOLN
500.0000 mL | Freq: Once | INTRAVENOUS | Status: AC
  Administered 2021-05-10: 500 mL via INTRAVENOUS

## 2021-05-10 MED ORDER — TRANEXAMIC ACID 1000 MG/10ML IV SOLN
INTRAVENOUS | Status: DC | PRN
  Administered 2021-05-10: 1000 mg via INTRAVENOUS

## 2021-05-10 MED ORDER — MORPHINE SULFATE (PF) 0.5 MG/ML IJ SOLN
INTRAMUSCULAR | Status: DC | PRN
  Administered 2021-05-10: 3 mg via EPIDURAL

## 2021-05-10 SURGICAL SUPPLY — 36 items
ADH SKN CLS LQ APL DERMABOND (GAUZE/BANDAGES/DRESSINGS) ×1
APL SKNCLS STERI-STRIP NONHPOA (GAUZE/BANDAGES/DRESSINGS) ×1
BENZOIN TINCTURE PRP APPL 2/3 (GAUZE/BANDAGES/DRESSINGS) ×3 IMPLANT
CHLORAPREP W/TINT 26ML (MISCELLANEOUS) ×6 IMPLANT
CLAMP CORD UMBIL (MISCELLANEOUS) IMPLANT
CLOTH BEACON ORANGE TIMEOUT ST (SAFETY) ×3 IMPLANT
DERMABOND ADHESIVE PROPEN (GAUZE/BANDAGES/DRESSINGS) ×1
DERMABOND ADVANCED .7 DNX6 (GAUZE/BANDAGES/DRESSINGS) IMPLANT
DRSG OPSITE POSTOP 4X10 (GAUZE/BANDAGES/DRESSINGS) ×3 IMPLANT
ELECT REM PT RETURN 9FT ADLT (ELECTROSURGICAL) ×2
ELECTRODE REM PT RTRN 9FT ADLT (ELECTROSURGICAL) ×2 IMPLANT
EXTRACTOR VACUUM M CUP 4 TUBE (SUCTIONS) IMPLANT
GLOVE BIOGEL PI IND STRL 7.0 (GLOVE) ×4 IMPLANT
GLOVE BIOGEL PI IND STRL 7.5 (GLOVE) ×4 IMPLANT
GLOVE BIOGEL PI INDICATOR 7.0 (GLOVE) ×2
GLOVE BIOGEL PI INDICATOR 7.5 (GLOVE) ×2
GLOVE ECLIPSE 7.5 STRL STRAW (GLOVE) ×3 IMPLANT
GOWN STRL REUS W/TWL LRG LVL3 (GOWN DISPOSABLE) ×9 IMPLANT
KIT ABG SYR 3ML LUER SLIP (SYRINGE) IMPLANT
NDL HYPO 25X5/8 SAFETYGLIDE (NEEDLE) IMPLANT
NEEDLE HYPO 25X5/8 SAFETYGLIDE (NEEDLE) IMPLANT
NS IRRIG 1000ML POUR BTL (IV SOLUTION) ×3 IMPLANT
PACK C SECTION WH (CUSTOM PROCEDURE TRAY) ×3 IMPLANT
PAD OB MATERNITY 4.3X12.25 (PERSONAL CARE ITEMS) ×3 IMPLANT
PENCIL SMOKE EVAC W/HOLSTER (ELECTROSURGICAL) ×3 IMPLANT
RTRCTR C-SECT PINK 25CM LRG (MISCELLANEOUS) ×3 IMPLANT
STRIP CLOSURE SKIN 1/2X4 (GAUZE/BANDAGES/DRESSINGS) ×3 IMPLANT
SUT VIC AB 0 CT1 36 (SUTURE) ×3 IMPLANT
SUT VIC AB 0 CTX 36 (SUTURE) ×4
SUT VIC AB 0 CTX36XBRD ANBCTRL (SUTURE) ×4 IMPLANT
SUT VIC AB 2-0 CT1 27 (SUTURE) ×2
SUT VIC AB 2-0 CT1 TAPERPNT 27 (SUTURE) ×2 IMPLANT
SUT VIC AB 4-0 KS 27 (SUTURE) ×3 IMPLANT
TOWEL OR 17X24 6PK STRL BLUE (TOWEL DISPOSABLE) ×3 IMPLANT
TRAY FOLEY W/BAG SLVR 14FR LF (SET/KITS/TRAYS/PACK) ×3 IMPLANT
WATER STERILE IRR 1000ML POUR (IV SOLUTION) ×3 IMPLANT

## 2021-05-10 NOTE — Progress Notes (Signed)
Called to evaluate patient with repetitive late deceleration. ?Patient reports feeling well and is comfortable with epidural. She denies chest pain or lightheadedness ? ?Blood pressure (!) 159/95, pulse (!) 123, temperature 97.7 ?F (36.5 ?C), temperature source Oral, resp. rate 16, height 5' 5.5" (1.664 m), weight 129 kg, last menstrual period 08/23/2020, SpO2 100 %. ? ?SVE 5/70/-2 ?FHT baseline 140 min-mod variability, no accels, occasional late decels ?Toco: ctx q 3-5 minutes ? ?A/P 31 yo G1P0 [facility]- patient with hypotension following epidural, s/p 3 doses of phenylephrine ?- terbutaline given ?- External resuscitative measures taken with IV fluid bolus and patient repositioning ?- FSE applied ?- will continue to manage BP to return it to her baseline 140-150/8090 ?- Plan of care discussed with the patient ?

## 2021-05-10 NOTE — Anesthesia Procedure Notes (Signed)
Epidural ?Patient location during procedure: OB ?Start time: 05/10/2021 6:32 AM ? ?Preanesthetic Checklist ?Completed: patient identified, IV checked, site marked, risks and benefits discussed, surgical consent, monitors and equipment checked, pre-op evaluation and timeout performed ? ?Epidural ?Patient position: sitting ?Prep: DuraPrep and site prepped and draped ?Patient monitoring: continuous pulse ox and blood pressure ?Approach: midline ?Location: L3-L4 ?Injection technique: LOR air ? ?Needle:  ?Needle type: Tuohy  ?Needle gauge: 17 G ?Needle length: 9 cm and 9 ?Needle insertion depth: 8 cm ?Catheter type: closed end flexible ?Catheter size: 19 Gauge ?Catheter at skin depth: 13 cm ?Test dose: negative and Other ? ?Assessment ?Events: blood not aspirated, injection not painful, no injection resistance, no paresthesia and negative IV test ? ?Additional Notes ?Patient identified. Risks and benefits discussed including failed block, incomplete  ?Pain control, post dural puncture headache, nerve damage, paralysis, blood pressure ?Changes, nausea, vomiting, reactions to medications-both toxic and allergic and post ?Partum back pain. All questions were answered. Patient expressed understanding and wished to proceed. Sterile technique was used throughout procedure. Epidural site was ?Dressed with sterile barrier dressing. No paresthesias, signs of intravascular injection ?Or signs of intrathecal spread were encountered.  ?Patient was more comfortable after the epidural was dosed. ?Please see RN's note for documentation of vital signs and FHR which are stable. ?Reason for block:procedure for pain ? ? ? ?

## 2021-05-10 NOTE — Progress Notes (Signed)
Patient ID: Megan Houston, female   DOB: December 15, 1990, 31 y.o.   MRN: [id number] ?Megan Houston is a 31 y.o. G1P0000 at 51w1dadmitted for induction of labor due to gestational hypertension  ? ?Subjective: ?uncomfortable w/ contractions ? ?Objective: ?BP 123/72   Pulse (!) 107   Temp 98.1 ?F (36.7 ?C) (Oral)   Resp 17   Ht 5' 5.5" (1.664 m)   Wt 129 kg   LMP 08/23/2020   SpO2 99%   BMI 46.61 kg/m?  ?No intake/output data recorded. ? ?FHR baseline 135 bpm, Variability: moderate, Accelerations: occasional Decelerations: absent ?Toco: q 1-382ms  ? ?SVE:   Dilation: 4 ?Effacement (%): 50 ?Station: -3 ?Exam by:: Megan Houston ?AROM small amt clear fluid, IUPC placed w/o difficulty, amnioinfusion started  ?Pitocin off ? ?Labs: ?Lab Results  ?Component Value Date  ? WBC 16.4 (H) 05/09/2021  ? HGB 11.8 (L) 05/09/2021  ? HCT 36.4 05/09/2021  ? MCV 85.6 05/09/2021  ? PLT 298 05/09/2021  ? ? ?Assessment / Plan: ?IOL d/t GHTN, s/p cytotec x 3 and foley bulb, pit was started @ 1630 and increased to 12, started having decels, some lates- pit cut in 1/2 to 37m70min at 2244, decels did not resolve, so pit stopped at 2315. Strip improved, then decels returned, so now AROM'd w/ IUPC, amnioinfusion (300m59mlus, 150ml67m, and terbutaline given @ 0150. Strip improved, will monitor and restart pitocin prn ? ?Labor: s/p cervical ripening ?Fetal Wellbeing:  Category II ?Pain Control:  labor support without medications ?Pre-eclampsia: asymptomatic, bp's stable, and labs stable ?I/D:  GBS neg ?Anticipated MOD: hopeful for vaginal birth ? ?Megan Houston ?05/10/2021, 0215 [phone]

## 2021-05-10 NOTE — Anesthesia Postprocedure Evaluation (Signed)
Anesthesia Post Note ? ?Patient: Megan Houston ? ?Procedure(s) Performed: CESAREAN SECTION ? ?  ? ?Patient location during evaluation: PACU ?Anesthesia Type: Epidural ?Level of consciousness: awake and alert ?Pain management: pain level controlled ?Vital Signs Assessment: post-procedure vital signs reviewed and stable ?Respiratory status: spontaneous breathing, nonlabored ventilation, respiratory function stable and patient connected to nasal cannula oxygen ?Cardiovascular status: blood pressure returned to baseline and stable ?Postop Assessment: no apparent nausea or vomiting ?Anesthetic complications: no ? ? ?No notable events documented. ? ?Last Vitals:  ?Vitals:  ? 05/10/21 1320 05/10/21 1430  ?BP: 119/72 115/66  ?Pulse: 95 97  ?Resp: 17 17  ?Temp: 36.7 ?C 36.8 ?C  ?SpO2: 96% 97%  ?  ?Last Pain:  ?Vitals:  ? 05/10/21 1430  ?TempSrc: Oral  ?PainSc:   ? ?Pain Goal: Patients Stated Pain Goal: 2 (05/09/21 1915) ? ?  ?  ?  ?  ?  ?  ?  ? ?Santa Lighter ? ? ? ? ?

## 2021-05-10 NOTE — Transfer of Care (Signed)
Immediate Anesthesia Transfer of Care Note ? ?Patient: Megan Houston ? ?Procedure(s) Performed: CESAREAN SECTION ? ?Patient Location: PACU ? ?Anesthesia Type:General and Epidural ? ?Level of Consciousness: awake, alert  and oriented ? ?Airway & Oxygen Therapy: Patient Spontanous Breathing ? ?Post-op Assessment: Report given to RN and Post -op Vital signs reviewed and stable ? ?Post vital signs: Reviewed and stable ? ?Last Vitals:  ?Vitals Value Taken Time  ?BP    ?Temp    ?Pulse 131 05/10/21 1000  ?Resp 27 05/10/21 1000  ?SpO2 100 % 05/10/21 1000  ?Vitals shown include unvalidated device data. ? ?Last Pain:  ?Vitals:  ? 05/10/21 0830  ?TempSrc:   ?PainSc: 0-No pain  ?   ? ?Patients Stated Pain Goal: 2 (05/09/21 1915) ? ?Complications: No notable events documented. ?

## 2021-05-10 NOTE — Progress Notes (Addendum)
Patient is a 31 yo g1 @ 37+1 admitted for IOL for gHTN. Labor course complicated by persistent category 2 tracing (late decelerations), SVE at around 7:30 this morning 5 cm dilated. Has been off pitocin since around 11 pm last night. Has received terbutaline x3, most recently at 8:00 AM for recurrent late decelerations. I was called to room for prolonged deceleration to the 70s that did not respond to position changes. Verbal consent to proceed with cesarean section was obtained. At about the five minute mark in the decel I called a code cesarean. The time was 8:31 AM. Prior to prep, circulating nurse unable to auscultate fetal heart tones. Incision time was 8:39 AM. Delivery was at 8:40 AM. I did de-brief with patient and partner in the PACU after surgery, answering all questions. ?

## 2021-05-10 NOTE — Lactation Note (Signed)
This note was copied from a baby's chart. ?Lactation Consultation Note ? ?Patient Name: Boy Keyana Guevara ?Today's Date: 05/10/2021 ?Reason for consult: Initial assessment;1st time breastfeeding;Early term 37-38.6wks ?Age:33 hours ?P1, ETI female infant. ?Per mom, infant will breastfeed for up to 1 hour. ?LC did not observe latch at this time, per mom, infant BF for 40 minutes at 1720 pm infant was asleep in basinet. ?Per mom, she feels a tug with latch. ?LC discussed due infant being ETI, limit feedings to 30 minutes or less to prevent infant burning  to many calories at a feeding, mom report some feedings have been 1 hour.  ?LC discussed do not let infant hang out or sleep at breast, he should actively be feeding with suck and swallows. ?Mom has been latching infant on both breast during a feeding. ?[place] set mom up with DEBP, mom was pumping when LC in room and colostrum was being expressed in breast flanges mom will finger feed or spoon feed colostrum to infant after latching infant at the breast. ?Mom knows breastfeeding is safe at room temperature up to 4 hours. ?Mom shown how to use DEBP & how to disassemble, clean, & reassemble parts.  ?Mom made aware of O/P services, breastfeeding support groups, community resources, and our phone # for post-discharge questions.   ? ?Mom's plan: ?1.Mom will continue to breastfeed infant according to hunger cues, 8 to 12+ or more times within 24 hours, skin to skin, limit feedings to 30 minutes or less ?2.Mom knows to pump every 3 hours for 15 minutes on initial setting and give infant back any EBM by finger feeding or spoon. ?3. Mom knows to call [facility] if she has any BF questions, concerns or need latch assistance.   ?Maternal Data ?Has patient been taught Hand Expression?: Yes ?Does the patient have breastfeeding experience prior to this delivery?: No ? ?Feeding ?Mother's Current Feeding Choice: Breast Milk ? ?LATCH Score ?Latch: Grasps breast easily, tongue down, lips flanged,  rhythmical sucking. ? ?Audible Swallowing: A few with stimulation ? ?Type of Nipple: Everted at rest and after stimulation ? ?Comfort (Breast/Nipple): Soft / non-tender ? ?Hold (Positioning): Assistance needed to correctly position infant at breast and maintain latch. ? ?LATCH Score: 8 ? ? ?Lactation Tools Discussed/Used ?Tools: Pump ?Breast pump type: Double-Electric Breast Pump ?Pump Education: Setup, frequency, and cleaning;Milk Storage ?Reason for Pumping: Infant is ETI infant, C/S delivery, P1 ?Pumping frequency: Mom knows to pump every 3 hours for 15 minutes on inital setting ? ?Interventions ?Interventions: Breast feeding basics reviewed;Skin to skin;Breast compression;Position options;Expressed milk;Hand express;DEBP;Education;LC Services brochure ? ?Discharge ?  ? ?Consult Status ?Consult Status: Follow-up ?Date: 05/11/21 ?Follow-up type: In-patient ? ? ? ?Vicente Serene ?05/10/2021, 6:39 PM ? ? ? ?

## 2021-05-10 NOTE — Discharge Summary (Signed)
? ?  Postpartum Discharge Summary ? ? ?   ?Patient Name: Megan Houston ?DOB: [birth year] ?MRN: [id number] ? ?Date of admission: 05/09/2021 ?Delivery date:05/10/2021  ?Delivering provider: Throop, [place]  ?Date of discharge: 05/12/2021 ? ?Admitting diagnosis: Gestational hypertension [O13.9] ?Intrauterine pregnancy: [redacted]w[redacted]d    ?Secondary diagnosis:  Principal Problem: ?  Gestational hypertension ?Active Problems: ?  MDD (major depressive disorder), recurrent, in full [place] ?  Supervision of normal first pregnancy ?  Rubella non-immune status, antepartum ?  S/P emergency cesarean section ? ?Additional problems: None    ?Discharge diagnosis: Term Pregnancy Delivered and Gestational Hypertension                                              ?Post partum procedures:   ?Augmentation: AROM, Pitocin, and Cytotec ?Complications: fetal bradycardia ? ?Hospital course: Induction of Labor With Cesarean Section   ?31y.o. yo G1P1001 at 380w1das admitted to the hospital 05/09/2021 for induction of labor. Patient had a labor course significant as she presented for IOL, received cytotec x3, a foley balloon, and was briefly on pitocin. She had a Cat II tracing with variable and lates and her pitocin was stopped. [facility] efforts fetus remained cat II and ended up having recurrent lates and ultimately had fetal terminal bradycardia. The patient went for cesarean section due to  fetal intolerance of labor . Delivery details are as follows: ?Membrane Rupture Time/Date: 1:21 AM ,05/10/2021   ?Delivery Method:C-Section, Low Transverse  ?Details of operation can be found in separate operative Note.  Patient had an uncomplicated postpartum course. She is ambulating, tolerating a regular diet, passing flatus, and urinating well.  Patient is discharged home in stable condition on 05/12/21.     ? ?Newborn Data: ?Birth date:05/10/2021  ?Birth time:8:40 AM  ?Gender:Female  ?Living  status:Living  ?Apgars:3 ,5  ?Weight:2722 g                               ? ?Magnesium Sulfate received: No ?BMZ received: No ?Rhophylac:N/A ?MMR:N/A ?T-DaP:Given prenatally ?Flu: Given prenatally ?Transfusion:No ? ?Physical exam  ?Vitals:  ? 05/11/21 1110 05/11/21 1300 05/11/21 2128 05/12/21 0500  ?BP: (!) 103/58 127/64 121/89 127/76  ?Pulse: 66 89 89 85  ?Resp: _0 ?Temp: 98.5 ?F (36.9 ?C) 98.4 ?F (36.9 ?C)  98.6 ?F (37 ?C)  ?TempSrc: Oral Oral  Oral  ?SpO2:  97%    ?Weight:      ?Height:      ? ?General: alert, cooperative, and no distress ?Lochia: appropriate ?Uterine Fundus: firm ?Incision: Healing well with no significant drainage, honeycomb dressing in place ?DVT Evaluation: No cords or calf tenderness. ?Calf/Ankle edema is present ?Labs: ?Lab Results  ?Component Value Date  ? WBC 25.0 (H) 05/10/2021  ? HGB 9.4 (L) 05/11/2021  ? HCT 33.7 (L) 05/10/2021  ? MCV 86.4 05/10/2021  ? PLT 274 05/10/2021  ? ? ?  Latest Ref Rng & Units 05/09/2021  ?  1:30 AM  ?CMP  ?Glucose 70 - 99 mg/dL 99    ?BUN 6 - 20 mg/dL 11    ?Creatinine 0.44 - 1.00 mg/dL 0.71    ?Sodium 135 - 145 mmol/L 136    ?Potassium 3.5 - 5.1 mmol/L 4.1    ?  Chloride 98 - 111 mmol/L 104    ?CO2 22 - 32 mmol/L 24    ?Calcium 8.9 - 10.3 mg/dL 9.4    ?Total Protein 6.5 - 8.1 g/dL 5.8    ?Total Bilirubin 0.3 - 1.2 mg/dL 0.4    ?Alkaline Phos 38 - 126 U/L 127    ?AST 15 - 41 U/L 27    ?ALT 0 - 44 U/L 19    ? ?Edinburgh Score: ? ?  05/10/2021  ? 12:15 PM  ?Flavia Shipper Postnatal Depression Scale Screening Tool  ?I have been able to laugh and see the funny side of things. 0  ?I have looked forward with enjoyment to things. 0  ?I have blamed myself unnecessarily when things went wrong. 0  ?I have been anxious or worried for no good reason. 0  ?I have felt scared or panicky for no good reason. 0  ?Things have been getting on top of me. 0  ?I have been so unhappy that I have had difficulty sleeping. 0  ?I have felt sad or miserable. 0  ?I have been so unhappy  that I have been crying. 0  ?The thought of harming myself has occurred to me. 0  ?Edinburgh Postnatal Depression Scale Total 0  ? ? ? ?After visit meds:  ?Allergies as of 05/12/2021   ? ?   Reactions  ? Codeine Nausea And Vomiting  ? Hydrocodone-acetaminophen Nausea And Vomiting  ? ?  ? ?  ?Medication List  ?  ? ?STOP taking these medications   ? ?aspirin 81 MG EC tablet ?  ?esomeprazole 20 MG capsule ?Commonly known as: [place] ?  ?MAGNESIUM PO ?  ? ?  ? ?TAKE these medications   ? ?furosemide 20 MG tablet ?Commonly known as: LASIX ?Take 1 tablet (20 mg total) by mouth daily. ?  ?ibuprofen 600 MG tablet ?Commonly known as: ADVIL ?Take 1 tablet (600 mg total) by mouth every 6 (six) hours. ?  ?oxyCODONE 5 MG immediate release tablet ?Commonly known as: Oxy IR/ROXICODONE ?Take 1 tablet (5 mg total) by mouth every 4 (four) hours as needed for severe pain. ?  ?prenatal multivitamin Tabs tablet ?Take 1 tablet by mouth daily at 12 noon. ?  ? ?  ? ? ? ?Discharge home in stable condition ?Infant Feeding: Breast ?Infant Disposition:home with mother ?Discharge instruction: per After Visit Summary and Postpartum booklet. ?Activity: Advance as tolerated. Pelvic rest for 6 weeks.  ?Diet: routine diet ?Future Appointments: ?Future Appointments  ?Date Time Provider [place]  ?05/17/2021  9:50 AM Myrtis Ser, CNM [facility]  ?05/30/2021 10:10 AM Roma Schanz, CNM [facility]  ? ?Follow up Visit: ?Message sent to [facility] by Dr. Cy Blamer on 05/10/2021 ? ?Please schedule this patient for a In person postpartum visit in 4 weeks with the following provider: MD. ?Additional Postpartum F/U:Incision check 1 week and BP check 1 week  ?High risk pregnancy complicated by: HTN ?Delivery mode:  C-Section, Low Transverse  ?Anticipated Birth Control:  Unsure ? ? ?05/12/2021 ?Christin Fudge, CNM ? ? ? ?

## 2021-05-10 NOTE — Anesthesia Preprocedure Evaluation (Addendum)
Anesthesia Evaluation  ?Patient identified by MRN, date of birth, ID band ?Patient awake ? ? ? ?Reviewed: ?Allergy & Precautions, Patient's Chart, lab work & pertinent test results ? ?Airway ?Mallampati: II ? ?TM Distance: >3 FB ?Neck ROM: Full ? ? ? Dental ?no notable dental hx. ? ?  ?Pulmonary ?asthma , former smoker,  ?  ?Pulmonary exam normal ? ? ? ? ? ? ? Cardiovascular ?hypertension, Normal cardiovascular exam ? ?PIH ?  ?Neuro/Psych ?PSYCHIATRIC DISORDERS Anxiety Depression Bipolar Disorder negative neurological ROS ?   ? GI/Hepatic ?Neg liver ROS, GERD  Medicated,  ?Endo/Other  ?Morbid obesity ? Renal/GU ?negative Renal ROS  ?negative genitourinary ?  ?Musculoskeletal ?negative musculoskeletal ROS ?(+)  ? Abdominal ?(+) + obese,   ?Peds ? Hematology ? ?(+) Blood dyscrasia, anemia ,   ?Anesthesia Other Findings ? ? Reproductive/Obstetrics ?(+) Pregnancy ?PIH ? ?  ? ? ? ? ? ? ? ? ? ? ? ? ? ?  ?  ? ? ? ? ? ? ? ? ?Anesthesia Physical ?Anesthesia Plan ? ?ASA: 3 and emergent ? ?Anesthesia Plan: Epidural  ? ?Post-op Pain Management:   ? ?Induction:  ? ?PONV Risk Score and Plan:  ? ?Airway Management Planned: Natural Airway ? ?Additional Equipment:  ? ?Intra-op Plan:  ? ?Post-operative Plan:  ? ?Informed Consent: I have reviewed the patients History and Physical, chart, labs and discussed the procedure including the risks, benefits and alternatives for the proposed anesthesia with the patient or authorized representative who has indicated his/her understanding and acceptance.  ? ? ? ? ? ?Plan Discussed with: Anesthesiologist ? ?Anesthesia Plan Comments: (EMERGENT EPIDURAL to C-SECTION)  ? ? ? ? ? ?Anesthesia Quick Evaluation ? ?

## 2021-05-10 NOTE — Progress Notes (Signed)
Patient ID: [name], female   DOB: April 09, 1990, 31 y.o.   MRN: [id number] ?JAZLEN OGARRO is a 31 y.o. G1P0000 at 56w1dadmitted for induction of labor due to gestational hypertension  ? ?Subjective: ?Just got epidural ? ?Objective: ?BP 107/60   Pulse (!) 107   Temp 97.7 ?F (36.5 ?C) (Oral)   Resp 16   Ht 5' 5.5" (1.664 m)   Wt 129 kg   LMP 08/23/2020   SpO2 99%   BMI 46.61 kg/m?  ?No intake/output data recorded. ? ?FHR baseline 140 bpm, Variability: min Accelerations:not present, Decelerations:  Present  variables, lates ?Toco: q 1-326ms  ? ?SVE:   Dilation: 5 ?Effacement (%): 70 ?Station: -2 ?Exam by:: DiBurney GauzeN ? ?Pitocin off ? ?Labs: ?Lab Results  ?Component Value Date  ? WBC 21.2 (H) 05/10/2021  ? HGB 11.7 (L) 05/10/2021  ? HCT 35.2 (L) 05/10/2021  ? MCV 85.6 05/10/2021  ? PLT 243 05/10/2021  ? ? ?Assessment / Plan: ?IOL d/t GHTN, s/p cytotec x 3, foley bulb, pit off since 2315, AROM/IUPC/amnioinfusion and terb @ 0150, strip improved and then began having some decels just before getting the epidura. Now having lates and deep variables. Dr. CoElly Modenaalled to assess. RN to give terb again and phenylephrine. Dr. CoElly Modenaalled to come to room at 0703 ? ?Labor: early ?Fetal Wellbeing:  Category II ?Pain Control:  epidural ?Pre-eclampsia: asymptomatic, labs stable, and bp's a little low for her ?I/D:  GBS neg ?Anticipated MOD: TBD ? ?KiBenitezWHNP-BC ?05/10/2021, 6:56 AM  ?

## 2021-05-10 NOTE — Progress Notes (Signed)
Patient ID: Megan Houston, female   DOB: 1990-02-23, 31 y.o.   MRN: [id number] ?Megan Houston is a 31 y.o. G1P0000 at 58w1dadmitted for induction of labor due to gestational hypertension  ? ?Subjective: ?uncomfortable w/ contractions and requesting epidural ? ?Objective: ?BP (!) 150/71   Pulse 84   Temp 97.7 ?F (36.5 ?C) (Oral)   Resp 16   Ht 5' 5.5" (1.664 m)   Wt 129 kg   LMP 08/23/2020   SpO2 99%   BMI 46.61 kg/m?  ?No intake/output data recorded. ? ?FHR baseline 145 bpm, Variability: moderate, Accelerations: occasional; Decelerations:  Present  mild variables, occ lates ?Toco: q 1-333ms  ? ?SVE:   Dilation: 5 ?Effacement (%): 70 ?Station: -2 ?Exam by:: DiBurney GauzeN ? ?Pitocin off ? ?Labs: ?Lab Results  ?Component Value Date  ? WBC 21.2 (H) 05/10/2021  ? HGB 11.7 (L) 05/10/2021  ? HCT 35.2 (L) 05/10/2021  ? MCV 85.6 05/10/2021  ? PLT 243 05/10/2021  ? ? ?Assessment / Plan: ?IOL d/t GHTN, s/p cytotec x3, foley bulb, pit off since 2315 d/t decels and IUPC/amnioinfusion and terb given earlier. Strip better, now some decels, but overall reassuring. Making some cervical change on her own. Requesting epidural. Will reassess after epidural.  ? ?Labor: early ?Fetal Wellbeing:  Category II ?Pain Control:  requesting epidural ?Pre-eclampsia: asymptomatic, bp's stable, and labs stable ?I/D:  GBS neg ?Anticipated MOD: hopeful for vaginal birth ? ?KiHooper BayWHNP-BC ?05/10/2021, 6:08 AM  ?

## 2021-05-10 NOTE — Anesthesia Procedure Notes (Signed)
Procedure Name: Intubation ?Date/Time: 05/10/2021 8:39 AM ?Performed by: Santa Lighter, MD ?Pre-anesthesia Checklist: Patient identified, Emergency Drugs available, Suction available and Patient being monitored ?Patient Re-evaluated:Patient Re-evaluated prior to induction ?Oxygen Delivery Method: Circle system utilized ?Preoxygenation: Pre-oxygenation with 100% oxygen ?Induction Type: IV induction ?Ventilation: Mask ventilation without difficulty ?Laryngoscope Size: Glidescope and 3 ?Grade View: Grade I ?Tube type: Oral ?Tube size: 7.0 mm ?Number of attempts: 1 ?Airway Equipment and Method: Stylet and Oral airway ?Placement Confirmation: ETT inserted through vocal cords under direct vision, positive ETCO2 and breath sounds checked- equal and bilateral ?Secured at: 22 cm ?Tube secured with: Tape ?Dental Injury: Teeth and Oropharynx as per pre-operative assessment  ? ? ? ? ?

## 2021-05-10 NOTE — Op Note (Addendum)
Cesarean Section Operative Report ? ?[place] ? ?05/09/2021 - 05/10/2021 ? ?Indications: fetal indications ? ?Pre-operative Diagnosis: STAT primary low transverse cesarean section ? ?Post-operative Diagnosis: Same  ? ?Surgeon: Surgeon(s) and Role: ?   * Georgene Kopper, Ailene Rud, MD - Primary ?   Renard Matter, MD - Fellow  ? ?Attending Attestation: I was present and scrubbed for the entire procedure.  ? ?Anesthesia: general  ?  ?Estimated Blood Loss: 361 ml ? ?Total IV Fluids: 2000 ml LR ? ?Urine Output:: 1100 ml clear yellow urine ? ?Specimens: none ? ?Findings: Viable female infant in cephalic presentation; Apgars 3/5/8; weight pending; arterial cord pH 7.02;  clear amniotic fluid; intact placenta with three vessel cord; normal uterus, fallopian tubes and ovaries bilaterally. ? ?Baby condition / location:  Couplet care / Skin to Skin  ? ?Complications: no complications ? ?Indications: ?Megan Houston is a 31 y.o. G1P0000 with an IUP 2w1dpresenting for IOL. STAT c/s for prolonged late deceleration in setting of persistent category 2 tracing. See progress note for further discussion. ? ?Due to the emergent nature of the procedure, brief verbal consent obtained from the patient ? ?Procedure Details:  ?The patient was taken back to the operative suite where the abdomen was prepped with iodine. Cefazolin 3 g and azithromycin 500 mg verbally ordered. ? ?A time out was held and the above information confirmed.  ? ?After induction of anesthesia, a Pfannenstiel incision was made and carried down through the subcutaneous tissue to the fascia. Fascial incision was made and bluntly extended transversely. The fascia was separated from the underlying rectus tissue superiorly and inferiorly. The peritoneum was identified and bluntly entered and extended longitudinally. Bladder blade was placed. A low transverse uterine incision was made and extended bluntly. Delivered from cephalic presentation was a viable infant with Apgars  and weight as above.  Tone was poor and there was no spontaneous movement or breathing so cord was promptly cut and infant handed to the NICU team. The umbilical cord was clamped and cut cord blood was obtained for evaluation. Cord ph was sent. The uterus was exteriorized and bladder blade placed. Prophylactic tranexamic acid was ordered. The placenta was removed Intact and appeared normal. The uterine outline, tubes and ovaries appeared normal.The uterine incision was closed with running locked sutures of 0-Vicryl with an imbricating layer of the same.   Hemostasis was observed after placement of one figure of eight 0 vicryl suture.  The rectus muscles were examined and hemostasis observed. The fascia was then reapproximated with running sutures of 0-Vicryl. The subcuticular closure was performed using 2-0 plain gut. The skin was closed with 4-0 Vicryl. ? ?Instrument, sponge, and needle counts were correct prior the abdominal closure and were correct at the conclusion of the case.  ? ? ? ?Disposition: PACU - hemodynamically stable.  ? ?Maternal Condition: stable  ? ? ? ? ? ?Signed: ?NEnnis Forts3/22/2023 9:11 AM ? ? ?

## 2021-05-11 ENCOUNTER — Encounter (HOSPITAL_COMMUNITY): Payer: Self-pay | Admitting: Obstetrics and Gynecology

## 2021-05-11 DIAGNOSIS — Z98891 History of uterine scar from previous surgery: Secondary | ICD-10-CM

## 2021-05-11 LAB — HEMOGLOBIN: Hemoglobin: 9.4 g/dL — ABNORMAL LOW (ref 12.0–15.0)

## 2021-05-11 LAB — BIRTH TISSUE RECOVERY COLLECTION (PLACENTA DONATION)

## 2021-05-11 MED ORDER — ONDANSETRON HCL 4 MG PO TABS
4.0000 mg | ORAL_TABLET | Freq: Three times a day (TID) | ORAL | Status: DC | PRN
  Administered 2021-05-11 – 2021-05-12 (×2): 4 mg via ORAL
  Filled 2021-05-11 (×2): qty 1

## 2021-05-11 MED ORDER — OXYCODONE HCL 5 MG PO TABS
5.0000 mg | ORAL_TABLET | ORAL | Status: DC | PRN
  Administered 2021-05-11 – 2021-05-12 (×4): 5 mg via ORAL
  Filled 2021-05-11 (×4): qty 1

## 2021-05-11 MED ORDER — HYDROCODONE-ACETAMINOPHEN 5-325 MG PO TABS: 1.0000 | ORAL_TABLET | Freq: Four times a day (QID) | ORAL | Status: DC | PRN

## 2021-05-11 MED ORDER — FERROUS SULFATE 325 (65 FE) MG PO TABS
325.0000 mg | ORAL_TABLET | ORAL | Status: DC
  Administered 2021-05-11: 325 mg via ORAL
  Filled 2021-05-11: qty 1

## 2021-05-11 NOTE — Addendum Note (Signed)
Addendum  created 05/11/21 1126 by Flossie Dibble, CRNA  ? Intraprocedure Event edited  ?  ?

## 2021-05-11 NOTE — Lactation Note (Signed)
This note was copied from a baby's chart. ?Lactation Consultation Note ? ?Patient Name: Megan Houston ?Today's Date: 05/11/2021 ?Reason for consult: Follow-up assessment;Mother's request;Early term 37-38.6wks;Infant weight loss;Breastfeeding assistance;Other (Comment) (PIH ( Lasix) Anemia ( Ferrous sulfate)) ?Age:33 hours ? ?LC on arrival noted infant latched in cradle position. We relatched him, STS with loose blanket covering both Mom and baby in football. Infant showing signs of milk transfer in midst of feeding.  ?Mom nipple soreness but nipples intact with no signs of compression. Mom using coconut oil for nipple care.  ?Mom declined use of DEBP and did not want to use a bottle at this time.  ? ?Infant adequate urine and stool output 3 urine and 5 stool since birth.  ?Plan 1. To feed based on cues 8-12x 24hr period. Mom to offer breasts and look for signs of milk transfer with breast compression.  ?2. Mom also offer EBM on spoon 5-7 ml if infant not latching then try a latch.  ?All questions answered at the end of the visit.  ? ?Maternal Data ?Has patient been taught Hand Expression?: Yes ? ?Feeding ?Mother's Current Feeding Choice: Breast Milk ? ?LATCH Score ?Latch: Repeated attempts needed to sustain latch, nipple held in mouth throughout feeding, stimulation needed to elicit sucking reflex. ? ?Audible Swallowing: Spontaneous and intermittent ? ?Type of Nipple: Everted at rest and after stimulation ? ?Comfort (Breast/Nipple): Soft / non-tender ? ?Hold (Positioning): Assistance needed to correctly position infant at breast and maintain latch. ? ?LATCH Score: 8 ? ? ?Lactation Tools Discussed/Used ?Tools: Coconut oil ? ?Interventions ?Interventions: Breast feeding basics reviewed;Assisted with latch;Skin to skin;Breast massage;Breast compression;Adjust position;Position options;Expressed milk;Education;Pace feeding;LC Magazine features editor;Infant Driven Feeding Algorithm education ? ?Discharge ?Pump:  Personal ?[place] Program: No ? ?Consult Status ?Consult Status: Follow-up ?Date: 05/12/21 ?Follow-up type: In-patient ? ? ? ?Yasiel Goyne  Nicholson-Springer ?05/11/2021, 6:19 PM ? ? ? ?

## 2021-05-11 NOTE — Progress Notes (Signed)
POSTPARTUM PROGRESS NOTE ? ?POD #1 ? ?Subjective: ? ?Megan Houston is a 31 y.o. G1P1001 s/p pLTCS at [redacted]w[redacted]d  She reports she doing well. No acute events overnight. She reports she is doing well. She denies any problems with ambulating, voiding or po intake. Denies nausea or vomiting. She has not yet passed flatus. Pain is moderately controlled.  Lochia is appropriate. ? ?Objective: ?Blood pressure 115/65, pulse 83, temperature 98.1 ?F (36.7 ?C), temperature source Oral, resp. rate 16, height 5' 5.5" (1.664 m), weight 129 kg, last menstrual period 08/23/2020, SpO2 99 %, unknown if currently breastfeeding. ? ?Physical Exam:  ?General: alert, cooperative and no distress ?Chest: no respiratory distress ?Heart:regular rate, distal pulses intact ?Abdomen: soft, nontender,  ?Uterine Fundus: firm, appropriately tender ?DVT Evaluation: No calf swelling or tenderness ?Extremities: some lower extremity edema bilaterally ?Skin: warm, dry; incision clean/dry/intact w/ honeycomb dressing in place ? ?Recent Labs  ?  05/10/21 ?0790203/22/23 ?1031 05/11/21 ?0453  ?HGB 11.7* 11.0* 9.4*  ?HCT 35.2* 33.7*  --   ? ? ?Assessment/Plan: ?Megan HENDERSHOTTis a 31y.o. G1P1001 s/p pLTCS at 360w1dor fetal bradycardia. ? ?POD#1 - Doing welll; pain moderately controlled, will discuss options to add on different medication says she is allergic to oxycodone. H/H appropriate ? Routine postpartum care ? OOB, ambulated ? Lovenox for VTE prophylaxis ?Anemia: asymptomatic 11>9.4 ? Start po ferrous sulfate BID ?Contraception: None (withdrawal) ?Feeding: Breast ? ?Dispo: Plan for discharge likely tomorrow. ? ? LOS: 2 days  ? ?Megan Houston ?FM PGY-1 ?05/11/2021, 7:45 AM  ?

## 2021-05-11 NOTE — Clinical Social Work Maternal (Signed)
?CLINICAL SOCIAL WORK MATERNAL/CHILD NOTE ? ?Patient Details  ?Name: Megan Houston ?MRN: [id number] ?Date of Birth: 07/12/1990 ? ?Date:  05/11/2021 ? ?Clinical Social Worker Initiating Note:  Meenakshi Sazama, LCSW Date/Time: Initiated:  05/11/21/1015    ? ?Child's Name:  Megan Houston  ? ?Biological Parents:  Mother, Father (MOB: Megan Houston 04/15/1988, FOB: Megan Houston 02-25-1989)  ? ?Need for Interpreter:  None  ? ?Reason for Referral:  Behavioral Health Concerns  ? ?Address:  [address] ?[place] [address]  ?  ?Phone number:  [phone] (home)    ? ?Additional phone number:  ? ?Household Members/Support Persons (HM/SP):   Household Member/Support Person 1 ? ? ?HM/SP Name Relationship DOB or Age  ?HM/SP -1 Megan Houston Spouse 02-25-1989  ?HM/SP -2        ?HM/SP -3        ?HM/SP -4        ?HM/SP -5        ?HM/SP -6        ?HM/SP -7        ?HM/SP -8        ? ? ?Natural Supports (not living in the home):     ? ?Professional Supports: None  ? ?Employment: Full-time  ? ?Type of Work: [employer]  ? ?Education:  Some College  ? ?Homebound arranged:   ? ?Financial Resources:  Private Insurance   ? ?Other Resources:  WIC, Food Stamps    ? ?Cultural/Religious Considerations Which May Impact Care:   ? ?Strengths:  Ability to meet basic needs  , Home prepared for child  , Pediatrician chosen  ? ?Psychotropic Medications:        ? ?Pediatrician:    [place] ? ?Pediatrician List:  ? ?[place]    ?[place]    ?[place]    ?[place] Other ([facility])  ? County    ?[place]    ? ? ?Pediatrician Fax Number:   ? ?Risk Factors/Current Problems:  None  ? ?Cognitive State:  Able to Concentrate  , Alert    ? ?Mood/Affect:  Bright  , Calm  , Relaxed  , Interested    ? ?CSW Assessment: CSW received consult for hx of Bipolar II, Anxiety and Major Depression.  CSW met with MOB to offer support and complete assessment.  ? ?CSW met with MOB at  bedside and introduced CSW role.  CSW observed MOB in bed, infant asleep in bassinet and FOB present at bedside. MOB presented calm and engaged well with CSW during the visit. MOB gave CSW permission to complete the assessment with FOB present. CSW inquired how MOB has felt since giving birth. MOB reported feeling good, tired and expressed that she has been trying to take naps. MOB shared the labor and delivery was very "traumatic" since she had to rushed in for a c-section and she was under anesthesia for the procedure. CSW acknowledged MOB emotions and concerns. CSW inquired how MOB felt emotionally during the pregnancy. MOB reported that she felt well and felt a decrease in her anxiety. CSW inquired about MOB mental health history. MOB reported she has a history of generalized anxiety disorder, PMDD and depression. MOB denied Bipolar II and explained that the diagnosis is incorrect. MOB reported she was misdiagnosed in 2016 and has the psychiatrist to remove the diagnosis from her medical record. MOB reported that she has not had any symptoms related to Bipolar. MOB reported she was prescribed Lamictal   and stopped taking it about 2-3 years ago. MOB reported she took Buspirone prior to pregnancy which she felt was helpful. MOB reported she saw a therapist early in the pregnancy which she felt was helpful. MOB reported she has access to medication and a therapist, if needed. MOB identified FOB as her primary support. CSW assessed MOB for safety. MOB reported no thoughts of harm to self and others.  ? ?CSW provided education regarding the baby blues period vs. perinatal mood disorders, discussed treatment and gave resources for mental health follow up if concerns arise.  CSW recommended MOB complete a self-evaluation during the postpartum time period using the New Mom Checklist from Postpartum Progress and encouraged MOB to contact a medical professional if symptoms are noted at any time. MOB reported she feels  comfortable reaching out to her provider if she has concerns.  ? ?MOB reported that she has essential items for the infant including a bassinet where the infant will sleep. CSW provided review of Sudden Infant Death Syndrome (SIDS) precautions. MOB reported understanding. MOB has chosen [facility] for the infant's follow up care. CSW assessed MOB for additional needs. MOB reported no further need.  ? ?CSW identifies no further need for intervention and no barriers to discharge at this time. ? ?CSW Plan/Description:  Perinatal Mood and Anxiety Disorder (PMADs) Education, No Further Intervention Required/No Barriers to Discharge, Sudden Infant Death Syndrome (SIDS) Education  ? ? ?[name] A Prakriti Carignan, LCSW ?05/11/2021, 1:55 PM ?

## 2021-05-12 ENCOUNTER — Other Ambulatory Visit (HOSPITAL_COMMUNITY): Payer: Self-pay

## 2021-05-12 ENCOUNTER — Encounter: Payer: Self-pay | Admitting: Women's Health

## 2021-05-12 ENCOUNTER — Encounter: Payer: BC Managed Care – PPO | Admitting: Obstetrics & Gynecology

## 2021-05-12 MED ORDER — IBUPROFEN 600 MG PO TABS
600.0000 mg | ORAL_TABLET | Freq: Four times a day (QID) | ORAL | 0 refills | Status: DC
  Filled 2021-05-12: qty 30, 8d supply, fill #0

## 2021-05-12 MED ORDER — OXYCODONE HCL 5 MG PO TABS
5.0000 mg | ORAL_TABLET | ORAL | 0 refills | Status: DC | PRN
  Filled 2021-05-12: qty 20, 4d supply, fill #0

## 2021-05-12 MED ORDER — FUROSEMIDE 20 MG PO TABS
20.0000 mg | ORAL_TABLET | Freq: Every day | ORAL | 0 refills | Status: DC
Start: 2021-05-12 — End: 2021-05-16
  Filled 2021-05-12: qty 5, 5d supply, fill #0

## 2021-05-12 NOTE — Lactation Note (Addendum)
This note was copied from a baby's chart. ?Lactation Consultation Note ? ?Patient Name: Megan Houston ?Today's Date: 05/12/2021 ?Reason for consult: Follow-up assessment;1st time breastfeeding;Early term 31 years;Infant < 6lbs ?Age:31 years ? ?P1, [redacted]w[redacted]d 7.83% weight loss.  Voids/stools WNL. Baby recently breastfed for 25 min and is sleeping on mother's chest.  Mother's nipples are sore.  Mother has coconut oil.  Suggest alternating coconut oil and comfort gels.  Observed feeding.  Baby latched with ease.  Intermittent swallows observed. Mother requested using NS but once applied, mother chose not to use it.  ?Mother will call LC to assist with pumping. ?Plan: ?Offer breast when baby cues that he/she is hungry, or awaken baby for feeding at 3- 3.5 hrs. ? Breast feed baby, asking for help prn.  Limit to 30 mins so not to overtire baby. ? Pump both breasts 15-20 minutes on initiation setting, adding breast massage and hand expression to collect as much colostrum as possible to feed baby. ? Feed baby volume pumped after breastfeeding per LPTI volume guidelines increasing per day of life and as baby desires.  ? ?Reviewed engorgement care and monitoring voids/stools. ? ?Feeding ?Mother's Current Feeding Choice: Breast Milk ? ? ?Lactation Tools Discussed/Used ?Tools: Pump;Coconut oil;Comfort gels ?Breast pump type: Double-Electric Breast Pump ?Reason for Pumping: supplementation and stimulation ?Pumping frequency: Recommend q 3 hours ? ?Interventions ?Interventions: Breast feeding basics reviewed;Comfort gels;Education;DEBP ? ?Discharge ?Discharge Education: Engorgement and breast care;Warning signs for feeding baby ?Pump: DEBP (Medela Max Flow) ? ?Consult Status ?Consult Status: Follow-up ?Date: 05/12/21 ?Follow-up type: In-patient ? ? ? ?BVivianne MasterBoschen ?05/12/2021, 8:44 AM ? ? ? ?

## 2021-05-14 ENCOUNTER — Other Ambulatory Visit: Payer: Self-pay

## 2021-05-14 ENCOUNTER — Telehealth: Payer: Self-pay | Admitting: Obstetrics & Gynecology

## 2021-05-14 ENCOUNTER — Encounter (HOSPITAL_COMMUNITY): Payer: Self-pay | Admitting: Obstetrics & Gynecology

## 2021-05-14 ENCOUNTER — Inpatient Hospital Stay (HOSPITAL_COMMUNITY)
Admission: AD | Admit: 2021-05-14 | Discharge: 2021-05-16 | DRG: 776 | Disposition: A | Discharge status: Home / Self Care | Payer: BC Managed Care – PPO | Attending: Obstetrics and Gynecology | Admitting: Obstetrics and Gynecology

## 2021-05-14 DIAGNOSIS — Z98891 History of uterine scar from previous surgery: Principal | ICD-10-CM

## 2021-05-14 DIAGNOSIS — Q828 Other specified congenital malformations of skin: Secondary | ICD-10-CM | POA: Diagnosis not present

## 2021-05-14 DIAGNOSIS — O1415 Severe pre-eclampsia, complicating the puerperium: Principal | ICD-10-CM | POA: Diagnosis present

## 2021-05-14 DIAGNOSIS — Z00110 Health examination for newborn under 8 days old: Secondary | ICD-10-CM | POA: Diagnosis not present

## 2021-05-14 DIAGNOSIS — Z9189 Other specified personal risk factors, not elsewhere classified: Secondary | ICD-10-CM | POA: Diagnosis not present

## 2021-05-14 DIAGNOSIS — R030 Elevated blood-pressure reading, without diagnosis of hypertension: Secondary | ICD-10-CM | POA: Diagnosis not present

## 2021-05-14 LAB — URINALYSIS, ROUTINE W REFLEX MICROSCOPIC
Bacteria, UA: NONE SEEN
Bilirubin Urine: NEGATIVE
Glucose, UA: NEGATIVE mg/dL
Ketones, ur: NEGATIVE mg/dL
Leukocytes,Ua: NEGATIVE
Nitrite: NEGATIVE
Protein, ur: NEGATIVE mg/dL
Specific Gravity, Urine: 1.002 — ABNORMAL LOW (ref 1.005–1.030)
pH: 8 (ref 5.0–8.0)

## 2021-05-14 LAB — COMPREHENSIVE METABOLIC PANEL
ALT: 27 U/L (ref 0–44)
AST: 34 U/L (ref 15–41)
Albumin: 2.4 g/dL — ABNORMAL LOW (ref 3.5–5.0)
Alkaline Phosphatase: 92 U/L (ref 38–126)
Anion gap: 9 (ref 5–15)
BUN: 8 mg/dL (ref 6–20)
CO2: 24 mmol/L (ref 22–32)
Calcium: 8.7 mg/dL — ABNORMAL LOW (ref 8.9–10.3)
Chloride: 109 mmol/L (ref 98–111)
Creatinine, Ser: 0.75 mg/dL (ref 0.44–1.00)
GFR, Estimated: >60 mL/min (ref >60)
Glucose, Bld: 95 mg/dL (ref 70–99)
Potassium: 3.9 mmol/L (ref 3.5–5.1)
Sodium: 142 mmol/L (ref 135–145)
Total Bilirubin: 0.4 mg/dL (ref 0.3–1.2)
Total Protein: 5.2 g/dL — ABNORMAL LOW (ref 6.5–8.1)

## 2021-05-14 LAB — CBC WITH DIFFERENTIAL/PLATELET
Abs Immature Granulocytes: 0.12 10*3/uL — ABNORMAL HIGH (ref 0.00–0.07)
Basophils Absolute: 0 10*3/uL (ref 0.0–0.1)
Basophils Relative: 0 %
Eosinophils Absolute: 0.2 10*3/uL (ref 0.0–0.5)
Eosinophils Relative: 2 %
HCT: 28.5 % — ABNORMAL LOW (ref 36.0–46.0)
Hemoglobin: 8.9 g/dL — ABNORMAL LOW (ref 12.0–15.0)
Immature Granulocytes: 1 %
Lymphocytes Relative: 11 %
Lymphs Abs: 1.3 10*3/uL (ref 0.7–4.0)
MCH: 27.6 pg (ref 26.0–34.0)
MCHC: 31.2 g/dL (ref 30.0–36.0)
MCV: 88.2 fL (ref 80.0–100.0)
Monocytes Absolute: 0.7 10*3/uL (ref 0.1–1.0)
Monocytes Relative: 6 %
Neutro Abs: 9.1 10*3/uL — ABNORMAL HIGH (ref 1.7–7.7)
Neutrophils Relative %: 80 %
Platelets: 330 10*3/uL (ref 150–400)
RBC: 3.23 MIL/uL — ABNORMAL LOW (ref 3.87–5.11)
RDW: 14.3 % (ref 11.5–15.5)
WBC: 11.3 10*3/uL — ABNORMAL HIGH (ref 4.0–10.5)
nRBC: 0 % (ref 0.0–0.2)

## 2021-05-14 MED ORDER — COCONUT OIL OIL
1.0000 "application " | TOPICAL_OIL | Status: DC | PRN
  Administered 2021-05-14: 1 via TOPICAL

## 2021-05-14 MED ORDER — PRENATAL MULTIVITAMIN CH
1.0000 | ORAL_TABLET | Freq: Every day | ORAL | Status: DC
  Administered 2021-05-14 – 2021-05-15 (×2): 1 via ORAL
  Filled 2021-05-14 (×3): qty 1

## 2021-05-14 MED ORDER — HYDRALAZINE HCL 20 MG/ML IJ SOLN: 10.0000 mg | INTRAMUSCULAR | Status: DC | PRN

## 2021-05-14 MED ORDER — ONDANSETRON HCL 4 MG/2ML IJ SOLN: 4.0000 mg | Freq: Four times a day (QID) | INTRAMUSCULAR | Status: DC | PRN

## 2021-05-14 MED ORDER — MAGNESIUM SULFATE 40 GM/1000ML IV SOLN
2.0000 g/h | INTRAVENOUS | Status: AC
  Administered 2021-05-14 – 2021-05-15 (×2): 2 g/h via INTRAVENOUS
  Filled 2021-05-14 (×3): qty 1000

## 2021-05-14 MED ORDER — LABETALOL HCL 5 MG/ML IV SOLN
20.0000 mg | INTRAVENOUS | Status: DC | PRN
  Administered 2021-05-14: 20 mg via INTRAVENOUS
  Filled 2021-05-14: qty 4

## 2021-05-14 MED ORDER — ZOLPIDEM TARTRATE 5 MG PO TABS: 5.0000 mg | ORAL_TABLET | Freq: Every evening | ORAL | Status: DC | PRN

## 2021-05-14 MED ORDER — FUROSEMIDE 40 MG PO TABS
20.0000 mg | ORAL_TABLET | Freq: Two times a day (BID) | ORAL | Status: DC
  Administered 2021-05-14 – 2021-05-15 (×3): 20 mg via ORAL
  Filled 2021-05-14 (×5): qty 1

## 2021-05-14 MED ORDER — LACTATED RINGERS IV SOLN: INTRAVENOUS | Status: DC

## 2021-05-14 MED ORDER — DOCUSATE SODIUM 100 MG PO CAPS
100.0000 mg | ORAL_CAPSULE | Freq: Two times a day (BID) | ORAL | Status: DC
  Administered 2021-05-14 – 2021-05-15 (×4): 100 mg via ORAL
  Filled 2021-05-14 (×4): qty 1

## 2021-05-14 MED ORDER — ONDANSETRON HCL 4 MG PO TABS: 4.0000 mg | ORAL_TABLET | Freq: Four times a day (QID) | ORAL | Status: DC | PRN

## 2021-05-14 MED ORDER — IBUPROFEN 600 MG PO TABS
600.0000 mg | ORAL_TABLET | Freq: Four times a day (QID) | ORAL | Status: DC | PRN
  Administered 2021-05-14 – 2021-05-16 (×5): 600 mg via ORAL
  Filled 2021-05-14 (×4): qty 1

## 2021-05-14 MED ORDER — LABETALOL HCL 5 MG/ML IV SOLN
40.0000 mg | INTRAVENOUS | Status: DC | PRN
  Administered 2021-05-14: 40 mg via INTRAVENOUS
  Filled 2021-05-14: qty 8

## 2021-05-14 MED ORDER — ALUM & MAG HYDROXIDE-SIMETH 200-200-20 MG/5ML PO SUSP: 30.0000 mL | ORAL | Status: DC | PRN

## 2021-05-14 MED ORDER — ENOXAPARIN SODIUM 60 MG/0.6ML IJ SOSY
60.0000 mg | PREFILLED_SYRINGE | INTRAMUSCULAR | Status: DC
  Administered 2021-05-14 – 2021-05-15 (×2): 60 mg via SUBCUTANEOUS
  Filled 2021-05-14 (×3): qty 0.6

## 2021-05-14 MED ORDER — CYCLOBENZAPRINE HCL 10 MG PO TABS
10.0000 mg | ORAL_TABLET | Freq: Three times a day (TID) | ORAL | Status: DC | PRN
  Administered 2021-05-14 – 2021-05-15 (×2): 10 mg via ORAL
  Filled 2021-05-14 (×2): qty 1

## 2021-05-14 MED ORDER — NIFEDIPINE ER OSMOTIC RELEASE 30 MG PO TB24
30.0000 mg | ORAL_TABLET | Freq: Every day | ORAL | Status: DC
  Administered 2021-05-14: 30 mg via ORAL
  Filled 2021-05-14: qty 1

## 2021-05-14 MED ORDER — OXYCODONE HCL 5 MG PO TABS
10.0000 mg | ORAL_TABLET | Freq: Four times a day (QID) | ORAL | Status: DC | PRN
Start: 2021-05-14 — End: 2021-05-16

## 2021-05-14 MED ORDER — BISACODYL 5 MG PO TBEC
5.0000 mg | DELAYED_RELEASE_TABLET | Freq: Every day | ORAL | Status: DC | PRN
  Administered 2021-05-15: 5 mg via ORAL
  Filled 2021-05-14 (×2): qty 1

## 2021-05-14 MED ORDER — MAGNESIUM SULFATE BOLUS VIA INFUSION
4.0000 g | Freq: Once | INTRAVENOUS | Status: AC
  Administered 2021-05-14: 4 g via INTRAVENOUS
  Filled 2021-05-14: qty 1000

## 2021-05-14 MED ORDER — LABETALOL HCL 5 MG/ML IV SOLN: 80.0000 mg | INTRAVENOUS | Status: DC | PRN

## 2021-05-14 MED ORDER — LACTATED RINGERS IV SOLN: INTRAVENOUS | Status: AC

## 2021-05-14 MED ORDER — NIFEDIPINE ER OSMOTIC RELEASE 60 MG PO TB24
60.0000 mg | ORAL_TABLET | Freq: Every day | ORAL | Status: DC
Start: 2021-05-15 — End: 2021-05-16
  Administered 2021-05-15 – 2021-05-16 (×2): 60 mg via ORAL
  Filled 2021-05-14 (×3): qty 1

## 2021-05-14 MED ORDER — MAGNESIUM HYDROXIDE 400 MG/5ML PO SUSP
30.0000 mL | Freq: Every day | ORAL | Status: DC | PRN
  Filled 2021-05-14: qty 30

## 2021-05-14 MED ORDER — NIFEDIPINE ER OSMOTIC RELEASE 30 MG PO TB24
30.0000 mg | ORAL_TABLET | Freq: Once | ORAL | Status: AC
Start: 2021-05-15 — End: 2021-05-14
  Administered 2021-05-14: 30 mg via ORAL
  Filled 2021-05-14: qty 1

## 2021-05-14 NOTE — MAU Note (Signed)
Pt reports to mau with c/o elevated bp at home.  Pt denies headache or visual changes.  Denies any other complaints at this time.  ?

## 2021-05-14 NOTE — Telephone Encounter (Signed)
? ? ? ?  Faculty Practice OB/GYN Physician Phone Call Documentation ? ?I received a phone call from [facility] about elevated BP for [employer].  She is POD#4 s/p LTCS, has GHTN. No antihypertensive meds were indicated on given at discharge. Today, her BP was 179/119, repeat 15 minutes late was 196/125.  No symptoms reported.  Patient was told to go the [facility] MAU immediately for evaluation and management.  MAU staff notified. ? ? ? ?Verita Schneiders, MD, FACOG ?Obstetrician Social research officer, government, Faculty Practice ?[facility] [place] ?

## 2021-05-14 NOTE — H&P (Signed)
Megan Houston is a 31 y.o. female presenting for elevated blood pressure. Patient reports last night she felt funny - was experiencing tingling in her legs and just felt off. She thought it was the oxycodone she was discharged home with and decided not to take any more after last night. Patient denies numbness or tingling at this time. Patient states she was prompted multiple times by the [employer] to take her blood pressure today, which she finally did, and found that after taking it 4 times it was in the severe range all 4 times. She spoke with the [facility] team and was advised to come to MAU for further evaluation. Patient denies HA, vision changes, chest pain, SOB, nausea, vomiting or other symptoms and reports she feels normal at this time. Patient is breastfeeding and has her baby with her in MAU. On arrival to MAU, pt found to have severe range pressures and labetalol protocol was ordered along with magnesium. Patient in agreement with plan for admission to [facility].  ? ?Patient was discharged home on 05/12/2021 s/p a STAT primary C/S for prolonged late deceleration with persistent category 2 tracing on 05/10/2021. ? ?OB History   ? ? Gravida  ?1  ? Para  ?1  ? Term  ?1  ? Preterm  ?0  ? AB  ?0  ? Living  ?1  ?  ? ? SAB  ?0  ? IAB  ?0  ? Ectopic  ?0  ? Multiple  ?0  ? Live Births  ?1  ?   ?  ?  ? ?Past Medical History:  ?Diagnosis Date  ? Anxiety   ? Asthma   ? Bipolar disorder ([place])   ? Depression   ? Heart murmur   ? Lipoma of back   ? MDD (major depressive disorder)   ? PMDD (premenstrual dysphoric disorder)   ? Pregnancy induced hypertension   ? Severe preeclampsia, postpartum condition 05/09/2021  ? ?Past Surgical History:  ?Procedure Laterality Date  ? CESAREAN SECTION N/A 05/10/2021  ? Procedure: CESAREAN SECTION;  Surgeon: Gwynne Edinger, MD;  Location: [facility] LD ORS;  Service: Obstetrics;  Laterality: N/A;  ? LIPOMA EXCISION N/A 01/01/2020  ? Procedure: EXCISION of LIPOMA from BACK;  Surgeon:  Aviva Signs, MD;  Location: [facility] ORS;  Service: General;  Laterality: N/A;  ? ?Family History: family history includes Alcohol abuse in her mother and sister; Anxiety disorder in her father and maternal grandmother; Bipolar disorder in her maternal grandmother, mother, and sister; Cancer in her mother; Diabetes in her father, maternal grandmother, mother, paternal aunt, and another family member; Drug abuse in her mother and sister; Hypertension in her father, mother, paternal aunt, and another family member; Liver disease in her paternal grandmother; OCD in her father; Schizophrenia in her maternal grandmother. ?Social History:  reports that she quit smoking about 6 years ago. Her smoking use included cigarettes. She has a 1.00 pack-year smoking history. She has never used smokeless tobacco. She reports that she does not currently use alcohol. She reports that she does not use drugs. ? ? ?  ?Maternal Diabetes: No ?Genetic Screening: Normal ?Maternal Ultrasounds/Referrals: Normal ?Fetal Ultrasounds or other Referrals:  None ?Maternal Substance Abuse:  No ?Significant Maternal Medications:  None ?Significant Maternal Lab Results:  None ?Other Comments:  None ? ?Review of Systems  ?Constitutional:  Negative for chills, diaphoresis, fatigue and fever.  ?Eyes:  Negative for visual disturbance.  ?Respiratory:  Negative for shortness of breath.   ?  Cardiovascular:  Negative for chest pain.  ?Gastrointestinal:  Negative for abdominal pain, constipation, diarrhea, nausea and vomiting.  ?Genitourinary:  Negative for dysuria, flank pain, frequency, pelvic pain, urgency and vaginal discharge.  ?Neurological:  Negative for dizziness, weakness, light-headedness and headaches.  ?Maternal Medical History:  ?Reason for admission: Nausea. ? ? ?  ?Blood pressure (!) 178/106, pulse 92, temperature 98.1 ?F (36.7 ?C), temperature source Oral, resp. rate 17, SpO2 97 %, unknown if currently breastfeeding. ? ?Patient Vitals for the past 24  hrs: ? BP Temp Temp src Pulse Resp SpO2  ?05/14/21 1301 (!) 178/106 -- -- 92 -- --  ?05/14/21 1246 (!) 185/98 -- -- -- -- --  ?05/14/21 1243 (!) 185/98 -- -- 91 -- --  ?05/14/21 1241 -- 98.1 ?F (36.7 ?C) Oral 92 17 97 %  ? ?Exam ?Physical Exam ?Vitals and nursing note reviewed.  ?Constitutional:   ?   General: She is not in acute distress. ?   Appearance: Normal appearance. She is not ill-appearing, toxic-appearing or diaphoretic.  ?HENT:  ?   Head: Normocephalic and atraumatic.  ?Pulmonary:  ?   Effort: Pulmonary effort is normal.  ?Neurological:  ?   Mental Status: She is alert.  ?Psychiatric:     ?   Mood and Affect: Mood normal.     ?   Behavior: Behavior normal.     ?   Thought Content: Thought content normal.     ?   Judgment: Judgment normal.  ?  ?Prenatal labs: ?ABO, Rh: --/--/O POS (03/21 0130) ?Antibody: NEG (03/21 0130) ?Rubella: 0.91 (09/27 1121) ?RPR: NON REACTIVE (03/21 0130)  ?HBsAg: Negative (09/27 1121)  ?HIV: Non Reactive (01/18 0813)  ?GBS: Negative/-- (03/17 1515)  ? ?Assessment/Plan: ?Severe postpartum preeclampsia ?Labetalol protocol entered ?Magnesium orders entered ? ?[name] ?05/14/2021, 1:27 PM ? ? ? ? ?

## 2021-05-14 NOTE — Plan of Care (Signed)
?  Problem: Education: ?Goal: Knowledge of General Education information will improve ?Description: Including pain rating scale, medication(s)/side effects and non-pharmacologic comfort measures ?Outcome: Completed/Met ?  ?

## 2021-05-15 DIAGNOSIS — Z9189 Other specified personal risk factors, not elsewhere classified: Secondary | ICD-10-CM | POA: Diagnosis not present

## 2021-05-15 DIAGNOSIS — Q828 Other specified congenital malformations of skin: Secondary | ICD-10-CM | POA: Diagnosis not present

## 2021-05-15 DIAGNOSIS — Z00110 Health examination for newborn under 8 days old: Secondary | ICD-10-CM | POA: Diagnosis not present

## 2021-05-15 LAB — COMPREHENSIVE METABOLIC PANEL
ALT: 29 U/L (ref 0–44)
AST: 32 U/L (ref 15–41)
Albumin: 2.6 g/dL — ABNORMAL LOW (ref 3.5–5.0)
Alkaline Phosphatase: 97 U/L (ref 38–126)
Anion gap: 9 (ref 5–15)
BUN: 6 mg/dL (ref 6–20)
CO2: 27 mmol/L (ref 22–32)
Calcium: 8 mg/dL — ABNORMAL LOW (ref 8.9–10.3)
Chloride: 104 mmol/L (ref 98–111)
Creatinine, Ser: 0.65 mg/dL (ref 0.44–1.00)
GFR, Estimated: >60 mL/min (ref >60)
Glucose, Bld: 103 mg/dL — ABNORMAL HIGH (ref 70–99)
Potassium: 3.7 mmol/L (ref 3.5–5.1)
Sodium: 140 mmol/L (ref 135–145)
Total Bilirubin: 0.4 mg/dL (ref 0.3–1.2)
Total Protein: 5.8 g/dL — ABNORMAL LOW (ref 6.5–8.1)

## 2021-05-15 LAB — CBC WITH DIFFERENTIAL/PLATELET
Abs Immature Granulocytes: 0.15 10*3/uL — ABNORMAL HIGH (ref 0.00–0.07)
Basophils Absolute: 0 10*3/uL (ref 0.0–0.1)
Basophils Relative: 0 %
Eosinophils Absolute: 0.2 10*3/uL (ref 0.0–0.5)
Eosinophils Relative: 1 %
HCT: 29.8 % — ABNORMAL LOW (ref 36.0–46.0)
Hemoglobin: 9.4 g/dL — ABNORMAL LOW (ref 12.0–15.0)
Immature Granulocytes: 1 %
Lymphocytes Relative: 17 %
Lymphs Abs: 2 10*3/uL (ref 0.7–4.0)
MCH: 27.4 pg (ref 26.0–34.0)
MCHC: 31.5 g/dL (ref 30.0–36.0)
MCV: 86.9 fL (ref 80.0–100.0)
Monocytes Absolute: 0.9 10*3/uL (ref 0.1–1.0)
Monocytes Relative: 7 %
Neutro Abs: 8.9 10*3/uL — ABNORMAL HIGH (ref 1.7–7.7)
Neutrophils Relative %: 74 %
Platelets: 354 10*3/uL (ref 150–400)
RBC: 3.43 MIL/uL — ABNORMAL LOW (ref 3.87–5.11)
RDW: 14.4 % (ref 11.5–15.5)
WBC: 12.1 10*3/uL — ABNORMAL HIGH (ref 4.0–10.5)
nRBC: 0 % (ref 0.0–0.2)

## 2021-05-15 NOTE — Progress Notes (Signed)
? ? ?Water engineer OB/GYN Attending Note ? ?Subjective:  ?Had severe range BP overnight, received another dose of Procardia XL 30.  Daily dose increased to 60 mg daily.  Still on magnesium sulfate infusion, up at 1300 today. ? ?Patient denies any headaches, visual symptoms, RUQ/epigastric pain or other concerning symptoms.  ? ?Admitted on 05/14/2021 for Hypertension in pregnancy, preeclampsia, severe, postpartum condition. ?   ?Objective:  ?Blood pressure (!) 151/83, pulse 80, temperature 97.6 ?F (36.4 ?C), temperature source Oral, resp. rate 16, SpO2 99 %, unknown if currently breastfeeding. ?Patient Vitals for the past 24 hrs: ? BP Temp Temp src Pulse Resp SpO2  ?05/15/21 0507 -- -- -- -- 16 --  ?05/15/21 0353 (!) 151/83 97.6 ?F (36.4 ?C) Oral 80 15 99 %  ?05/15/21 0300 -- -- -- -- 16 --  ?05/15/21 0101 -- -- -- -- 18 --  ?05/15/21 0029 133/73 -- -- 92 -- --  ?05/15/21 0018 -- -- -- -- 20 --  ?05/14/21 2312 (!) 143/77 -- -- 93 -- --  ?05/14/21 2301 (!) 162/87 -- -- 89 -- --  ?05/14/21 2248 (!) 159/81 97.9 ?F (36.6 ?C) Oral 88 16 99 %  ?05/14/21 2149 (!) 145/78 97.8 ?F (36.6 ?C) Oral 87 18 98 %  ?05/14/21 2049 127/80 97.9 ?F (36.6 ?C) Oral 83 18 100 %  ?05/14/21 1948 133/76 97.8 ?F (36.6 ?C) Oral 88 20 99 %  ?05/14/21 1847 129/76 -- -- 82 17 99 %  ?05/14/21 1746 133/79 -- -- 94 18 98 %  ?05/14/21 1648 137/77 -- -- 94 17 99 %  ?05/14/21 1547 (!) 148/84 -- -- 87 18 99 %  ?05/14/21 1448 133/83 97.8 ?F (36.6 ?C) Oral 84 17 98 %  ?05/14/21 1433 (!) 145/84 97.8 ?F (36.6 ?C) Oral 89 17 98 %  ?05/14/21 1413 (!) 150/79 98.6 ?F (37 ?C) Oral 82 19 99 %  ?05/14/21 1346 (!) 155/91 -- -- 84 -- --  ?05/14/21 1332 (!) 166/87 -- -- 90 -- --  ?05/14/21 1301 (!) 178/106 -- -- 92 -- --  ?05/14/21 1300 -- -- -- -- -- 97 %  ?05/14/21 1255 -- -- -- -- -- 98 %  ?05/14/21 1250 -- -- -- -- -- 97 %  ?05/14/21 1246 (!) 185/98 -- -- -- -- --  ?05/14/21 1245 -- -- -- -- -- 98 %  ?05/14/21 1243 (!) 185/98 -- -- 91 -- --  ?05/14/21 1241 --  98.1 ?F (36.7 ?C) Oral 92 17 97 %  ? ?Gen: NAD ?HENT: Normocephalic, atraumatic ?Lungs: Normal respiratory effort ?Heart: Regular rate noted ?Abdomen: NT, soft, incision C/D/I ?Cervix: Deferred ?Ext: 2+ DTRs, no edema, no cyanosis, negative Homan's sign ? ? ?Labs: ? ?  Latest Ref Rng & Units 05/15/2021  ?  4:30 AM 05/14/2021  ?  1:20 PM 05/09/2021  ?  1:30 AM  ?CMP  ?Glucose 70 - 99 mg/dL 103   95   99    ?BUN 6 - 20 mg/dL '6   8   11    '$ ?Creatinine 0.44 - 1.00 mg/dL 0.65   0.75   0.71    ?Sodium 135 - 145 mmol/L 140   142   136    ?Potassium 3.5 - 5.1 mmol/L 3.7   3.9   4.1    ?Chloride 98 - 111 mmol/L 104   109   104    ?CO2 22 - 32 mmol/L '27   24   24    '$ ?Calcium 8.9 -  10.3 mg/dL 8.0   8.7   9.4    ?Total Protein 6.5 - 8.1 g/dL 5.8   5.2   5.8    ?Total Bilirubin 0.3 - 1.2 mg/dL 0.4   0.4   0.4    ?Alkaline Phos 38 - 126 U/L 97   92   127    ?AST 15 - 41 U/L 32   34   27    ?ALT 0 - 44 U/L '29   27   19    '$ ? ? ?  Latest Ref Rng & Units 05/15/2021  ?  4:30 AM 05/14/2021  ?  1:20 PM 05/11/2021  ?  4:53 AM  ?CBC  ?WBC 4.0 - 10.5 K/uL 12.1   11.3     ?Hemoglobin 12.0 - 15.0 g/dL 9.4   8.9   9.4    ?Hematocrit 36.0 - 46.0 % 29.8   28.5     ?Platelets 150 - 400 K/uL 354   330     ? ? ?Assessment & Plan:  ?31 y.o. G1P1001 s/p LTCS on 05/10/21, readmitted for postpartum severe preeclampsia ?- Continue magnesium sulfate for 24 hours as ordered ?- Continue Procardia XL 60 mg po daily and Lasix 20 mg po bid as ordered ?- Stable labs ?- Continue close observation. ? ? ?Verita Schneiders, MD, FACOG ?Obstetrician Social research officer, government, Faculty Practice ?[facility] [place] ? ? ? ? ?

## 2021-05-16 ENCOUNTER — Other Ambulatory Visit (HOSPITAL_COMMUNITY): Payer: Self-pay

## 2021-05-16 DIAGNOSIS — Z00110 Health examination for newborn under 8 days old: Secondary | ICD-10-CM | POA: Diagnosis not present

## 2021-05-16 DIAGNOSIS — Z9189 Other specified personal risk factors, not elsewhere classified: Secondary | ICD-10-CM | POA: Diagnosis not present

## 2021-05-16 MED ORDER — NIFEDIPINE ER 60 MG PO TB24
60.0000 mg | ORAL_TABLET | Freq: Every day | ORAL | 0 refills | Status: DC
  Filled 2021-05-16: qty 30, 30d supply, fill #0

## 2021-05-16 MED ORDER — FUROSEMIDE 20 MG PO TABS
20.0000 mg | ORAL_TABLET | Freq: Two times a day (BID) | ORAL | 0 refills | Status: DC
  Filled 2021-05-16: qty 6, 3d supply, fill #0

## 2021-05-16 NOTE — Discharge Summary (Signed)
Physician Discharge Summary  ?Patient ID: ?Megan Houston ?MRN: [id number] ?DOB/AGE: [birth year] 31 y.o. ? ?Admit date: 05/14/2021 ?Discharge date: 05/16/2021 ? ?Admission Diagnoses: Postpartum Severe Preeclampsia ? ?Discharge Diagnoses:  ?Principal Problem: ?  Severe preeclampsia, postpartum condition ? ? ?Discharged Condition: good ? ?Hospital Course: Pt was admitted with above Dx. Received magnesium x 24 abd started on Procardia. BP responded well. Pt was ambulating, tolerating diet and good oral pain control. ?Felt amendable for discharge home ? ?Consults: None ? ?Significant Diagnostic Studies: lab ? ?Treatments: IV hydration and magnesium ? ?Discharge Exam: ?Blood pressure 134/82, pulse 92, temperature 98.5 ?F (36.9 ?C), temperature source Oral, resp. rate 18, SpO2 100 %, unknown if currently breastfeeding. ?Lungs clear Heart RRR ?Abd soft, + BS, U-2, incision healing well ?GU deferred ?Ext trace edema ? ?Disposition: Discharge disposition: 01-Home or Self Care ? ? ? ? ? ? ?Discharge Instructions   ? ? Activity as tolerated   Complete by: As directed ?  ? Call MD for:   Complete by: As directed ?  ? BP > 160/110  ? Call MD for:  difficulty breathing, headache or visual disturbances   Complete by: As directed ?  ? Call MD for:  extreme fatigue   Complete by: As directed ?  ? Call MD for:  hives   Complete by: As directed ?  ? Call MD for:  persistant dizziness or light-headedness   Complete by: As directed ?  ? Call MD for:  persistant nausea and vomiting   Complete by: As directed ?  ? Call MD for:  redness, tenderness, or signs of infection (pain, swelling, redness, odor or green/yellow discharge around incision site)   Complete by: As directed ?  ? Call MD for:  severe uncontrolled pain   Complete by: As directed ?  ? Call MD for:  temperature >100.4   Complete by: As directed ?  ? Diet - low sodium heart healthy   Complete by: As directed ?  ? Sexual activity   Complete by: As directed ?  ? Pelvic rest  ? ?   ? ?Allergies as of 05/16/2021   ? ?   Reactions  ? Codeine Nausea And Vomiting  ? Hydrocodone-acetaminophen Nausea And Vomiting  ? ?  ? ?  ?Medication List  ?  ? ?TAKE these medications   ? ?furosemide 20 MG tablet ?Commonly known as: Lasix ?Take 1 tablet (20 mg total) by mouth 2 (two) times daily for 3 days. ?What changed: when to take this ?  ?ibuprofen 600 MG tablet ?Commonly known as: ADVIL ?Take 1 tablet (600 mg total) by mouth every 6 (six) hours. ?  ?NIFEdipine 60 MG 24 hr tablet ?Commonly known as: ADALAT CC ?Take 1 tablet (60 mg total) by mouth daily. ?  ?oxyCODONE 5 MG immediate release tablet ?Commonly known as: Oxy IR/ROXICODONE ?Take 1 tablet (5 mg total) by mouth every 4 (four) hours as needed for severe pain. ?  ?prenatal multivitamin Tabs tablet ?Take 1 tablet by mouth daily at 12 noon. ?  ? ?  ? ? Follow-up Information   ? ? [facility] Follow up.   ?Specialty: Obstetrics and Gynecology ?Why: Pt has appt tomorrow ?Contact information: ?[address] [place] ?[phone] ? ?  ?  ? ?  ?  ? ?  ? ? ?Signed: ?Chancy Milroy ?05/16/2021, 8:58 AM ? ? ?

## 2021-05-17 ENCOUNTER — Encounter: Payer: Self-pay | Admitting: Advanced Practice Midwife

## 2021-05-17 ENCOUNTER — Other Ambulatory Visit: Payer: Self-pay

## 2021-05-17 ENCOUNTER — Ambulatory Visit (INDEPENDENT_AMBULATORY_CARE_PROVIDER_SITE_OTHER): Payer: BC Managed Care – PPO | Admitting: Advanced Practice Midwife

## 2021-05-17 VITALS — BP 136/83 | HR 109 | Wt 270.0 lb

## 2021-05-17 DIAGNOSIS — Z029 Encounter for administrative examinations, unspecified: Secondary | ICD-10-CM

## 2021-05-17 DIAGNOSIS — Z09 Encounter for follow-up examination after completed treatment for conditions other than malignant neoplasm: Secondary | ICD-10-CM

## 2021-05-17 MED ORDER — NIFEDIPINE ER OSMOTIC RELEASE 60 MG PO TB24: 60.0000 mg | ORAL_TABLET | Freq: Every day | ORAL | 3 refills | Status: DC

## 2021-05-17 NOTE — Progress Notes (Signed)
? ?GYN VISIT ?Patient name: Megan Houston MRN [id number]  Date of birth: Aug 07, 1990 ?Chief Complaint:   ?Routine Post Op ? ?History of Present Illness:   ?Megan Houston is a 31 y.o. G32P1001 Caucasian female being seen today for wound check s/p pLTCS 7d ago, and BP eval s/p being readmitted 3/26-3/28 for PP severe pre-e requiring mag sulfate and being d/c on Procardia XL '60mg'$  and Lasix '20mg'$  bid x 3d.    ?No LMP recorded. ?The current method of family planning is abstinence.  ?Last pap Feb 2022. Results were: NILM w/ HRHPV negative ? ? ?  03/08/2021  ?  8:46 AM 11/15/2020  ? 10:15 AM 10/20/2020  ?  3:11 PM 09/29/2020  ?  7:48 AM 07/21/2020  ?  3:17 PM  ?Depression screen PHQ 2/9  ?Decreased Interest 0 0     ?Down, Depressed, Hopeless 0 0     ?PHQ - 2 Score 0 0     ?Altered sleeping 2 2     ?Tired, decreased energy 3 2     ?Change in appetite 0 0     ?Feeling bad or failure about yourself  0 0     ?Trouble concentrating 0 0     ?Moving slowly or fidgety/restless 0 0     ?Suicidal thoughts 0 0     ?PHQ-9 Score 5 4     ?Difficult doing work/chores       ?  ? Information is confidential and restricted. Go to Review Flowsheets to unlock data.  ? ?  ? ?  03/08/2021  ?  8:46 AM 11/15/2020  ? 10:15 AM 04/01/2020  ? 10:52 AM  ?GAD 7 : Generalized Anxiety Score  ?Nervous, Anxious, on Edge 0 0 1  ?Control/stop worrying 0 0 1  ?Worry too much - different things 0 0 1  ?Trouble relaxing 0 0 1  ?Restless 0 0 1  ?Easily annoyed or irritable 0 0 1  ?Afraid - awful might happen 0 0 1  ?Total GAD 7 Score 0 0 7  ? ? ? ?Review of Systems:   ?Pertinent items are noted in HPI ?Denies fever/chills, dizziness, headaches, visual disturbances, fatigue, shortness of breath, chest pain, abdominal pain, vomiting, abnormal vaginal discharge/itching/odor/irritation, problems with periods, bowel movements, urination, or intercourse unless otherwise stated above.  ?Pertinent History Reviewed:  ?Reviewed past medical,surgical, social, obstetrical and  family history.  ?Reviewed problem list, medications and allergies. ?Physical Assessment:  ? ?Vitals:  ? 05/17/21 1006  ?BP: 136/83  ?Pulse: (!) 109  ?Weight: 270 lb (122.5 kg)  ?Body mass index is 44.25 kg/m?. ? ?     Physical Examination:  ? General appearance: alert, well appearing, and in no distress ? Mental status: alert, oriented to person, place, and time ? Skin: warm & dry  ? Cardiovascular: normal heart rate noted ? Respiratory: normal respiratory effort, no distress ? Abdomen: soft, appropriately tender; incision well-approximated and healing; no erythema or drainage ? Pelvic: examination not indicated ? Extremities: 1+ edema ? ? ?No results found for this or any previous visit (from the past 24 hour(s)).  ?Assessment & Plan:  ?1) s/p pLTCS x 7d> incision healing ? ?2) s/p readmit for severe PP pre-e> BP decently controlled on ProcardiaXL '60mg'$  and Lasix '20mg'$  bid x 3d; rev'd s/s of worsening condition and when to call; refills given on Procardia ? ?Meds:  ?Meds ordered this encounter  ?Medications  ? NIFEdipine (PROCARDIA XL) 60 MG 24 hr tablet  ?  Sig: Take 1 tablet (60 mg total) by mouth daily.  ?  Dispense:  30 tablet  ?  Refill:  3  ?  Order Specific Question:   Supervising Provider  ?  AnswerJanyth Pupa [[id number]]  ? ? ?No orders of the defined types were placed in this encounter. ? ? ?Return for As scheduled.(PP visit 05/30/21) ? ?Myrtis Ser CNM ?05/17/2021 ?10:30 AM  ?

## 2021-05-18 ENCOUNTER — Encounter: Payer: Self-pay | Admitting: Women's Health

## 2021-05-18 ENCOUNTER — Telehealth: Payer: Self-pay | Admitting: *Deleted

## 2021-05-18 DIAGNOSIS — Z00111 Health examination for newborn 8 to 28 days old: Secondary | ICD-10-CM | POA: Diagnosis not present

## 2021-05-18 DIAGNOSIS — O1415 Severe pre-eclampsia, complicating the puerperium: Secondary | ICD-10-CM

## 2021-05-18 DIAGNOSIS — Z9189 Other specified personal risk factors, not elsewhere classified: Secondary | ICD-10-CM | POA: Diagnosis not present

## 2021-05-18 MED ORDER — LISINOPRIL 5 MG PO TABS
5.0000 mg | ORAL_TABLET | Freq: Every day | ORAL | 2 refills | Status: DC
Start: 2021-05-18 — End: 2021-05-22

## 2021-05-18 NOTE — Telephone Encounter (Signed)
Pt's BP was 161/94. Pt rechecked BP. It went down to 140/99. Pt had some headache earlier. Pt feels tired and weak. Pt also feels "weird" in center of stomach. Pt is on Procardia. I spoke with Manus Rudd, CNM. Come back Monday for BP check with nurse. Take BP med at least 1 hour before appt. Postpartum visit needs to be around 4/26. Call transferred to front to schedule nurse visit and postpartum visit. JSY ?

## 2021-05-18 NOTE — Telephone Encounter (Signed)
Pt w/ elevated bp's despite procardia '30mg'$  daily, add lisinopril '5mg'$  daily. F/u Monday for bp check w/ nurse, take meds at least 1hr prior to visit.  ?Roma Schanz, CNM, WHNP-BC ?05/18/2021 ?4:31 PM  ?

## 2021-05-19 ENCOUNTER — Encounter: Payer: BC Managed Care – PPO | Admitting: Women's Health

## 2021-05-19 ENCOUNTER — Encounter: Payer: Self-pay | Admitting: Women's Health

## 2021-05-20 ENCOUNTER — Encounter: Payer: Self-pay | Admitting: Obstetrics & Gynecology

## 2021-05-22 ENCOUNTER — Ambulatory Visit (INDEPENDENT_AMBULATORY_CARE_PROVIDER_SITE_OTHER): Payer: BC Managed Care – PPO | Admitting: *Deleted

## 2021-05-22 ENCOUNTER — Other Ambulatory Visit: Payer: Self-pay | Admitting: Women's Health

## 2021-05-22 VITALS — BP 116/72 | HR 112

## 2021-05-22 DIAGNOSIS — Z0130 Encounter for examination of blood pressure without abnormal findings: Secondary | ICD-10-CM

## 2021-05-22 MED ORDER — LISINOPRIL 10 MG PO TABS
10.0000 mg | ORAL_TABLET | Freq: Every day | ORAL | 1 refills | Status: DC
Start: 2021-05-22 — End: 2021-06-14

## 2021-05-22 NOTE — Progress Notes (Addendum)
? ?  NURSE VISIT- BLOOD PRESSURE CHECK ? ?SUBJECTIVE:  ?Megan Houston is a 31 y.o. G57P1001 female here for BP check. She is postpartum, delivery date 05/10/21    ? ?HYPERTENSION ROS: ? ?Postpartum:  ?Severe headaches that don't go away with tylenol/other medicines: No  ?Visual changes (seeing spots/double/blurred vision) No  ?Severe pain under right breast breast or in center of upper chest No  ?Severe nausea/vomiting No  ?Taking medicines as instructed yes ? ? ? ?OBJECTIVE:  ?BP 116/72 (BP Location: Right Arm, Patient Position: Sitting, Cuff Size: Large)   Pulse (!) 112   Breastfeeding Yes   ?Appearance alert, well appearing, and in no distress and oriented to person, place, and time. ? ?ASSESSMENT: ?Postpartum  blood pressure check ? ?PLAN: ?Discussed with Knute Neu, CNM, [place]   ?Recommendations: stop medicine 2 days before next visit(stop Lisinopril only 2 days prior) ?Follow-up: as scheduled. Will send in refill on Lisinopril ? ?Kristeen Miss Cheryll Keisler  ?05/22/2021 ?10:35 AM ? ?Chart reviewed for nurse visit. Agree with plan of care.  ?Roma Schanz, CNM ?05/22/2021 12:09 PM   ? ?

## 2021-05-22 NOTE — Patient Instructions (Signed)
Stop Lisinopril only 2 days prior to postpartum visit ? ? ? ?Tips To Increase Milk Supply ?Lots of water! Enough so that your urine is clear ?Plenty of calories, if you're not getting enough calories, your milk supply can decrease ?Breastfeed/pump often, every 2-3 hours x 20-60mns ?Fenugreek 3 pills 3 times a day, this may make your urine smell like maple syrup ?Mother's Milk Tea ?Lactation cookies, google for the recipe ?Real oatmeal ?Body Armor sports drinks ?Greater Than hydration drink ? ?

## 2021-05-24 DIAGNOSIS — H04552 Acquired stenosis of left nasolacrimal duct: Secondary | ICD-10-CM | POA: Diagnosis not present

## 2021-05-24 DIAGNOSIS — Z00111 Health examination for newborn 8 to 28 days old: Secondary | ICD-10-CM | POA: Diagnosis not present

## 2021-05-25 ENCOUNTER — Encounter: Payer: BC Managed Care – PPO | Admitting: Advanced Practice Midwife

## 2021-05-30 ENCOUNTER — Ambulatory Visit (INDEPENDENT_AMBULATORY_CARE_PROVIDER_SITE_OTHER): Payer: BC Managed Care – PPO | Admitting: Adult Health

## 2021-05-30 ENCOUNTER — Telehealth: Payer: Self-pay | Admitting: Women's Health

## 2021-05-30 ENCOUNTER — Encounter: Payer: Self-pay | Admitting: Women's Health

## 2021-05-30 ENCOUNTER — Encounter: Payer: Self-pay | Admitting: Adult Health

## 2021-05-30 ENCOUNTER — Ambulatory Visit: Payer: BC Managed Care – PPO | Admitting: Women's Health

## 2021-05-30 VITALS — BP 128/82 | HR 110

## 2021-05-30 DIAGNOSIS — N61 Mastitis without abscess: Secondary | ICD-10-CM | POA: Insufficient documentation

## 2021-05-30 MED ORDER — DICLOXACILLIN SODIUM 500 MG PO CAPS: 500.0000 mg | ORAL_CAPSULE | Freq: Four times a day (QID) | ORAL | 0 refills | Status: DC

## 2021-05-30 NOTE — Progress Notes (Signed)
?  Subjective:  ?  ? Patient ID: Megan Houston, female   DOB: [birth year], 31 y.o.   MRN: [id number] ? ?HPI ?Megan Houston is a 31 year old white female,married, G1P1, had C-section 05/10/21 and is worked in for complaints of pain and redness right breast, started last night. ?She is breastfeeding and pumping. ?Lab Results  ?Component Value Date  ? DIAGPAP  04/01/2020  ?  - Negative for intraepithelial lesion or malignancy (NILM)  ? [place] Negative 04/01/2020  ? PCP is Delman Cheadle PA. ? ?Review of Systems ?+Pain and redness right breast  ?  Reviewed past medical,surgical, social and family history. Reviewed medications and allergies.  ?Objective:  ? Physical Exam ?BP 128/82 (BP Location: Right Arm, Patient Position: Sitting, Cuff Size: Normal)   Pulse (!) 110   Breastfeeding Yes   ? Skin warm and dry,  Breasts:no dominate palpable mass, retraction or nipple discharge, but on right breast that is tenderness and firmness and redness from 5-7 0' clock. ? [place] - 05/30/21 1441   ? ?  ? Pregnancy Intention Screening  ? Does the patient want to become pregnant in the next year? No   ? Does the patient's partner want to become pregnant in the next year? No   ? Would the patient like to discuss contraceptive options today? No   ?  ? Contraception Wrap Up  ? Current Method Abstinence   ? End Method Abstinence   ? Contraception Counseling Provided No   ? ?  ?  ? ?  ?  ?   ?Assessment:  ?   ?1. Mastitis, right, acute ?Will rx dicloxacillin ?Meds ordered this encounter  ?Medications  ? dicloxacillin (DYNAPEN) 500 MG capsule  ?  Sig: Take 1 capsule (500 mg total) by mouth 4 (four) times daily.  ?  Dispense:  28 capsule  ?  Refill:  0  ?  Order Specific Question:   Supervising Provider  ?  Answer:   Tania Ade H [[id number]]  ? Can use frozen peas on for 10 mintues then off ?Massage that area ?Continue to feed and bump ?Wear supportive  bra ?Can take tylenol and ibuprofen ?Review handout on mastitis with breastfeeding ? ?    ?Plan:  ?   ?Return appt 06/14/21 with Maudie Mercury for PPV, or sooner if needed  ?   ?

## 2021-06-07 DIAGNOSIS — Z00121 Encounter for routine child health examination with abnormal findings: Secondary | ICD-10-CM | POA: Diagnosis not present

## 2021-06-07 DIAGNOSIS — Q381 Ankyloglossia: Secondary | ICD-10-CM | POA: Diagnosis not present

## 2021-06-14 ENCOUNTER — Ambulatory Visit (INDEPENDENT_AMBULATORY_CARE_PROVIDER_SITE_OTHER): Payer: BC Managed Care – PPO | Admitting: Obstetrics and Gynecology

## 2021-06-14 ENCOUNTER — Encounter: Payer: Self-pay | Admitting: Obstetrics and Gynecology

## 2021-06-14 DIAGNOSIS — O1415 Severe pre-eclampsia, complicating the puerperium: Secondary | ICD-10-CM

## 2021-06-14 MED ORDER — NIFEDIPINE ER OSMOTIC RELEASE 30 MG PO TB24: 30.0000 mg | ORAL_TABLET | Freq: Every day | ORAL | 2 refills | Status: DC

## 2021-06-14 NOTE — Progress Notes (Signed)
? ? ?Post Partum Visit Note ? ?Megan Houston is a 31 y.o. G37P1001 female who presents for a postpartum visit. She is 5 week postpartum following a primary cesarean section.  I have fully reviewed the prenatal and intrapartum course. The delivery was at 32 gestational weeks.  Anesthesia: general. Postpartum course has been complicated by PP preeclampsia that required readmission. Baby is doing well. Baby is feeding by breast. Bleeding  stopped but then occasional spotting . Bowel function is normal. Bladder function is normal. Patient is not sexually active. Contraception method is  none - she had trouble conceiving so she doesn't want anything . Postpartum depression screening: negative. ? ? Upstream - 06/14/21 1006   ? ?  ? Pregnancy Intention Screening  ? Does the patient want to become pregnant in the next year? No   ? Does the patient's partner want to become pregnant in the next year? No   ? Would the patient like to discuss contraceptive options today? No   ?  ? Contraception Wrap Up  ? Current Method Withdrawal or Other Method   ? End Method Withdrawal or Other Method   ? Contraception Counseling Provided No   ? ?  ?  ? ?  ? ?The pregnancy intention screening data noted above was reviewed. Potential methods of contraception were discussed. The patient elected to proceed with Withdrawal or Other Method. ? ? Edinburgh Postnatal Depression Scale - 06/14/21 1011   ? ?  ? Edinburgh Postnatal Depression Scale:  In the Past 7 Days  ? I have been able to laugh and see the funny side of things. 0   ? I have looked forward with enjoyment to things. 0   ? I have blamed myself unnecessarily when things went wrong. 1   ? I have been anxious or worried for no good reason. 0   ? I have felt scared or panicky for no good reason. 0   ? Things have been getting on top of me. 1   ? I have been so unhappy that I have had difficulty sleeping. 0   ? I have felt sad or miserable. 0   ? I have been so unhappy that I have been  crying. 0   ? The thought of harming myself has occurred to me. 0   ? Edinburgh Postnatal Depression Scale Total 2   ? ?  ?  ? ?  ? ? ?Health Maintenance Due  ?Topic Date Due  ? COVID-19 Vaccine (1) Never done  ? ? ?The following portions of the patient's history were reviewed and updated as appropriate: allergies, current medications, past family history, past medical history, past social history, past surgical history, and problem list. ? ?Review of Systems ?Pertinent items are noted in HPI. ? ?Objective:  ?BP 119/75 (BP Location: Left Arm, Patient Position: Sitting, Cuff Size: Large)   Pulse 95   Ht 5' 5.5" (1.664 m)   Wt 253 lb (114.8 kg)   Breastfeeding Yes   BMI 41.46 kg/m?   ? ?General:  alert, cooperative, and no distress  ? Breasts:  normal  ?Lungs: clear to auscultation bilaterally  ?Heart:  regular rate and rhythm  ?Abdomen: soft, non-tender; bowel sounds normal; no masses,  no organomegaly   ?Wound well approximated incision - some suture at incision and this was removed  ?GU exam:  not indicated  ?     ?Assessment:  ? ? There are no diagnoses linked to this encounter. ? ?5w  postpartum exam.  ? ?Plan:  ? ?Essential components of care per ACOG recommendations: ? ?1.  Mood and well being: Patient with negative depression screening today. Reviewed local resources for support.  ?- Patient tobacco use? No.   ?- hx of drug use? No.   ? ?2. Infant care and feeding:  ?-Patient currently breastmilk feeding? Yes. Reviewed importance of draining breast regularly to support lactation.  ?-Social determinants of health (SDOH) reviewed in EPIC. No concerns ? ?3. Sexuality, contraception and birth spacing ?- Patient does not want a pregnancy in the next year.  Desired family size is 2 children.  ?- Reviewed reproductive life planning. Reviewed contraceptive methods based on pt preferences and effectiveness.  She declines birth control ?- Discussed birth spacing of 18 months especially with desire for [place].  ? ?4.  Sleep and fatigue ?-Encouraged family/partner/community support of 4 hrs of uninterrupted sleep to help with mood and fatigue ? ?5. Physical Recovery  ?- Discussed patients delivery and complications. She describes her labor as mixed. ?- Patient had a C-section emergent.  ?- Patient has urinary incontinence? No. ?- Patient is safe to resume physical and sexual activity ? ?6.  Health Maintenance ?- HM due items addressed Yes ?- Last pap smear  ?Diagnosis  ?Date Value Ref Range Status  ?04/01/2020   Final  ? - Negative for intraepithelial lesion or malignancy (NILM)  ? Pap smear not done at today's visit.  ?-Breast Cancer screening indicated? No.  ? ?7. Chronic Disease/Pregnancy Condition follow up: Hypertension - will taper down her procardia to 30 daily. She has stopped the lisinopril. ? ? ?Radene Gunning, MD ?[facility] [place] ?

## 2021-07-12 DIAGNOSIS — Z00121 Encounter for routine child health examination with abnormal findings: Secondary | ICD-10-CM | POA: Diagnosis not present

## 2021-07-12 DIAGNOSIS — Z23 Encounter for immunization: Secondary | ICD-10-CM | POA: Diagnosis not present

## 2021-07-12 DIAGNOSIS — R011 Cardiac murmur, unspecified: Secondary | ICD-10-CM | POA: Diagnosis not present

## 2021-07-17 ENCOUNTER — Encounter: Payer: Self-pay | Admitting: Women's Health

## 2021-08-20 ENCOUNTER — Encounter: Payer: Self-pay | Admitting: Women's Health

## 2021-08-21 ENCOUNTER — Other Ambulatory Visit: Payer: Self-pay | Admitting: Women's Health

## 2021-08-21 MED ORDER — VALACYCLOVIR HCL 1 G PO TABS: 1000.0000 mg | ORAL_TABLET | Freq: Two times a day (BID) | ORAL | 6 refills | Status: DC

## 2021-09-02 ENCOUNTER — Encounter: Payer: Self-pay | Admitting: Women's Health

## 2021-09-04 ENCOUNTER — Other Ambulatory Visit: Payer: Self-pay | Admitting: Obstetrics & Gynecology

## 2021-09-04 DIAGNOSIS — Z9189 Other specified personal risk factors, not elsewhere classified: Secondary | ICD-10-CM

## 2021-09-04 MED ORDER — METOCLOPRAMIDE HCL 10 MG PO TABS: 10.0000 mg | ORAL_TABLET | Freq: Three times a day (TID) | ORAL | 0 refills | Status: DC

## 2021-09-04 NOTE — Progress Notes (Signed)
Rx for Reglan to help with breastfeeding

## 2021-09-13 ENCOUNTER — Other Ambulatory Visit: Payer: Self-pay | Admitting: Obstetrics & Gynecology

## 2021-11-02 DIAGNOSIS — R7309 Other abnormal glucose: Secondary | ICD-10-CM | POA: Diagnosis not present

## 2021-11-02 DIAGNOSIS — O99019 Anemia complicating pregnancy, unspecified trimester: Secondary | ICD-10-CM | POA: Diagnosis not present

## 2021-11-02 DIAGNOSIS — Z6841 Body Mass Index (BMI) 40.0 and over, adult: Secondary | ICD-10-CM | POA: Diagnosis not present

## 2021-11-02 DIAGNOSIS — E039 Hypothyroidism, unspecified: Secondary | ICD-10-CM | POA: Diagnosis not present

## 2021-11-02 DIAGNOSIS — E119 Type 2 diabetes mellitus without complications: Secondary | ICD-10-CM | POA: Diagnosis not present

## 2021-11-02 DIAGNOSIS — Z1389 Encounter for screening for other disorder: Secondary | ICD-10-CM | POA: Diagnosis not present

## 2022-02-23 DIAGNOSIS — L301 Dyshidrosis [pompholyx]: Secondary | ICD-10-CM | POA: Diagnosis not present

## 2022-02-23 DIAGNOSIS — L02426 Furuncle of left lower limb: Secondary | ICD-10-CM | POA: Diagnosis not present

## 2022-02-23 DIAGNOSIS — B9689 Other specified bacterial agents as the cause of diseases classified elsewhere: Secondary | ICD-10-CM | POA: Diagnosis not present

## 2022-02-23 DIAGNOSIS — L308 Other specified dermatitis: Secondary | ICD-10-CM | POA: Diagnosis not present

## 2022-02-24 ENCOUNTER — Ambulatory Visit
Admission: RE | Admit: 2022-02-24 | Discharge: 2022-02-24 | Disposition: A | Discharge status: Home / Self Care | Payer: BC Managed Care – PPO | Source: Ambulatory Visit

## 2022-02-24 VITALS — BP 146/89 | HR 86 | Temp 98.0°F | Resp 16

## 2022-02-24 DIAGNOSIS — R519 Headache, unspecified: Secondary | ICD-10-CM | POA: Diagnosis not present

## 2022-02-24 NOTE — ED Provider Notes (Signed)
[facility]    CSN: [id number] Arrival date & time: 02/24/22  0905      History   Chief Complaint Chief Complaint  Patient presents with   Head Injury    I have pain when touching my right temple.  After touching my right temple I get a headache & my head has a rush of pressure.  Today my face began to tingle & felt like it was going numb. - Entered by patient   Appointment    0900    HPI Megan Houston is a 32 y.o. female.   The history is provided by the patient.   Patient presents for complaints of tingling and numbness in her face that occurred over the past 24 hours.  She states that the episode lasted for a few seconds to minutes, and has since subsided.  She also complains of pain when touching her right temple that is been present over the past several months, most frequently occurring after she had her son.  She states after she touches the right temple, she gets a headache, and feels "a rush of pressure, almost like a sinus infection".  She denies head trauma, fever, chills, ear pain, dizziness, visual changes, weakness, nausea, vomiting, diarrhea, light sensitivity, or sound sensitivity.  She states that over the last 2 weeks, she feels her symptoms have worsened.  Past Medical History:  Diagnosis Date   Anxiety    Asthma    Bipolar disorder ([place])    Depression    Heart murmur    Lipoma of back    MDD (major depressive disorder)    PMDD (premenstrual dysphoric disorder)    Pregnancy induced hypertension    Severe preeclampsia, postpartum condition 05/09/2021    Patient Active Problem List   Diagnosis Date Noted   S/P emergency cesarean section 05/11/2021   Severe preeclampsia, postpartum condition 05/09/2021   Rubella non-immune status, antepartum 11/17/2020   Anxiety 12/15/2018   PMDD (premenstrual dysphoric disorder) 09/16/2018    Past Surgical History:  Procedure Laterality Date   CESAREAN SECTION N/A 05/10/2021   Procedure: CESAREAN  SECTION;  Surgeon: Gwynne Edinger, MD;  Location: [facility];  Service: Obstetrics;  Laterality: N/A;   LIPOMA EXCISION N/A 01/01/2020   Procedure: EXCISION of LIPOMA from BACK;  Surgeon: Aviva Signs, MD;  Location: [facility];  Service: General;  Laterality: N/A;    OB History     Gravida  1   Para  1   Term  1   Preterm  0   AB  0   Living  1      SAB  0   IAB  0   Ectopic  0   Multiple  0   Live Births  1            Home Medications    Prior to Admission medications   Medication Sig Start Date End Date Taking? Authorizing Provider  VITAMIN D, CHOLECALCIFEROL, PO Take by mouth.   Yes [provider]  ibuprofen (ADVIL) 600 MG tablet Take 1 tablet (600 mg total) by mouth every 6 (six) hours. 05/12/21   Cresenzo-Dishmon, Joaquim Lai, CNM  Magnesium 250 MG TABS  12/20/20   [provider]  metoCLOPramide (REGLAN) 10 MG tablet Take 1 tablet (10 mg total) by mouth 3 (three) times daily for 14 days. 09/04/21 09/18/21  Janyth Pupa, DO  NIFEdipine (PROCARDIA-XL/NIFEDICAL-XL) 30 MG 24 hr tablet Take 1 tablet (30 mg total) by mouth  daily. Can increase to twice a day as needed for symptomatic contractions 06/14/21   Radene Gunning, MD  Prenatal Vit-Fe Fumarate-FA (PRENATAL MULTIVITAMIN) TABS tablet Take 1 tablet by mouth daily at 12 noon.    [provider]  valACYclovir (VALTREX) 1000 MG tablet Take 1 tablet (1,000 mg total) by mouth 2 (two) times daily. X 1 day for fever blister 08/21/21   Roma Schanz, CNM    Family History Family History  Problem Relation Age of Onset   Liver disease Paternal Grandmother    Diabetes Maternal Grandmother    Bipolar disorder Maternal Grandmother    Anxiety disorder Maternal Grandmother    Schizophrenia Maternal Grandmother    OCD Father    Anxiety disorder Father    Diabetes Father    Hypertension Father    Diabetes Mother    Hypertension Mother    Bipolar disorder Mother    Alcohol abuse Mother     Drug abuse Mother    Cancer Mother        cervical   Bipolar disorder Sister    Drug abuse Sister    Alcohol abuse Sister    Hypertension Paternal Aunt    Diabetes Paternal Aunt    Hypertension Other    Diabetes Other     Social History Social History   Tobacco Use   Smoking status: Former    Packs/day: 0.25    Years: 4.00    Total pack years: 1.00    Types: Cigarettes    Quit date: 05/27/2014    Years since quitting: 7.7   Smokeless tobacco: Never  Vaping Use   Vaping Use: Never used  Substance Use Topics   Alcohol use: Not Currently    Comment: rarely   Drug use: No     Allergies   Codeine and Hydrocodone-acetaminophen   Review of Systems Review of Systems Per HPI  Physical Exam Triage Vital Signs ED Triage Vitals  Enc Vitals Group     BP 02/24/22 0913 (!) 146/89     Pulse Rate 02/24/22 0913 86     Resp 02/24/22 0913 16     Temp 02/24/22 0913 98 F (36.7 C)     Temp Source 02/24/22 0913 Oral     SpO2 02/24/22 0913 95 %     Weight --      Height --      Head Circumference --      Peak Flow --      Pain Score 02/24/22 0916 0     Pain Loc --      Pain Edu? --      Excl. in [place]? --    No data found.  Updated Vital Signs BP (!) 146/89 (BP Location: Right Arm)   Pulse 86   Temp 98 F (36.7 C) (Oral)   Resp 16   SpO2 95%   Breastfeeding Yes   Visual Acuity Right Eye Distance:   Left Eye Distance:   Bilateral Distance:    Right Eye Near:   Left Eye Near:    Bilateral Near:     Physical Exam Vitals and nursing note reviewed.  Constitutional:      General: She is not in acute distress.    Appearance: Normal appearance.  HENT:     Head: Normocephalic.     Right Ear: Tympanic membrane, ear canal and external ear normal.     Left Ear: Tympanic membrane, ear canal and external ear normal.  Nose: Nose normal.     Mouth/Throat:     Mouth: Mucous membranes are moist.  Eyes:     Extraocular Movements: Extraocular movements intact.      Conjunctiva/sclera: Conjunctivae normal.     Pupils: Pupils are equal, round, and reactive to light.  Cardiovascular:     Rate and Rhythm: Normal rate and regular rhythm.     Pulses: Normal pulses.     Heart sounds: Normal heart sounds.  Pulmonary:     Effort: Pulmonary effort is normal. No respiratory distress.     Breath sounds: Normal breath sounds. No stridor. No wheezing, rhonchi or rales.  Abdominal:     General: Bowel sounds are normal.     Palpations: Abdomen is soft.     Tenderness: There is no abdominal tenderness.  Musculoskeletal:     Cervical back: Normal range of motion.  Lymphadenopathy:     Cervical: No cervical adenopathy.  Neurological:     Mental Status: She is alert and oriented to person, place, and time.     GCS: GCS eye subscore is 4. GCS verbal subscore is 5. GCS motor subscore is 6.     Cranial Nerves: Cranial nerves 2-12 are intact.     Sensory: Sensation is intact.     Motor: Motor function is intact.     Coordination: Coordination is intact.     Gait: Gait is intact.      UC Treatments / Results  Labs (all labs ordered are listed, but only abnormal results are displayed) Labs Reviewed - No data to display  EKG   Radiology No results found.  Procedures Procedures (including critical care time)  Medications Ordered in UC Medications - No data to display  Initial Impression / Assessment and Plan / UC Course  I have reviewed the triage vital signs and the nursing notes.  Pertinent labs & imaging results that were available during my care of the patient were reviewed by me and considered in my medical decision making (see chart for details).  The patient is well-appearing, she is in no acute distress, she is mildly hypertensive, but vital signs are otherwise stable.  Difficult to ascertain the cause of the patient's symptoms.  Patient's neurological exam is normal without residual deficits or concerns noted.  Based on the chronicity of her  symptoms, would recommend that the patient follow-up with her primary care physician.  Patient experience facial numbness and tingling, but that has since improved.  Discussed with patient to continue to monitor symptoms.  Patient was given strict ER precautions to include severe headache, dizziness, visual changes, weakness, inability to walk, or other concerns.  Patient was in agreement with this plan of care.  Patient verbalizes understanding.  All questions were answered.  Patient is stable for discharge.   Final Clinical Impressions(s) / UC Diagnoses   Final diagnoses:  Nonintractable headache, unspecified chronicity pattern, unspecified headache type     Discharge Instructions      Your neurological exam is normal today. As discussed, recommend following up with your primary care physician for further evaluation. Go to the emergency department immediately if you experience severe headache, dizziness, visual changes, weakness, or other concerns.  Follow-up as needed.     ED Prescriptions   None    PDMP not reviewed this encounter.   Tish Men, NP 02/24/22 [phone]

## 2022-02-24 NOTE — Discharge Instructions (Addendum)
Your neurological exam is normal today. As discussed, recommend following up with your primary care physician for further evaluation. Go to the emergency department immediately if you experience severe headache, dizziness, visual changes, weakness, or other concerns.  Follow-up as needed.

## 2022-02-24 NOTE — ED Triage Notes (Signed)
Pt tingling and numbness in face when touch her face x 1 day. Reports pain when touching my right temple after she gave birth to her son, 9 months ago.  After touching  right temple she gets  a headache and feels a " rush of pressure".  Denies dizziness, nausea, vision changes, weakness.

## 2022-02-26 DIAGNOSIS — R519 Headache, unspecified: Secondary | ICD-10-CM | POA: Diagnosis not present

## 2022-02-26 DIAGNOSIS — Z6841 Body Mass Index (BMI) 40.0 and over, adult: Secondary | ICD-10-CM | POA: Diagnosis not present

## 2022-03-21 DIAGNOSIS — J029 Acute pharyngitis, unspecified: Secondary | ICD-10-CM | POA: Diagnosis not present

## 2022-03-21 DIAGNOSIS — Z6841 Body Mass Index (BMI) 40.0 and over, adult: Secondary | ICD-10-CM | POA: Diagnosis not present

## 2022-04-04 NOTE — Progress Notes (Signed)
Referring:  Sharilyn Sites, MD [address] [place],  [place] [address]  PCP: Jake Samples, PA-C  Neurology was asked to evaluate Megan Houston, a 32 year old female for a chief complaint of headaches.  Our recommendations of care will be communicated by shared medical record.    CC:  headaches  History provided from self  HPI:  Medical co-morbidities: anxiety  The patient presents for evaluation of headaches. She has a history of headaches since her 64s. Previously took Topamax but could not tolerate this due to paresthesias. Headaches did improve for several years. However she was recently pregnant and developed pre-eclampsia, which triggered severe headaches. These improved slightly until December of last year. Around that time she started to develop right temporal tenderness which progressed to a severe headache, dizziness, and facial numbness. Headaches and dizziness were constant for one week. She is now having headaches once per week. Headaches are associated with photophobia, phonophobia, and nausea. She will also see lights in her vision like "camera flashes".  She is currently 11 months postpartum and breastfeeding.  Headache History: Onset: December 2023 Triggers: bending forward Aura: spots in vision, face numbness/tingling Location: temples Associated Symptoms:  Photophobia: yes  Phonophobia: yes  Nausea: yes Other symptoms: dizziness (lightheadedness and vertigo) Worse with activity?: yes Duration of headaches: up to 1 week  Migraine days per month: 4 Headache free days per month: 26  Current Treatment: Abortive Tylenol ibuprofen  Preventative none  Prior Therapies                                 Tylenol Ibuprofen Topamax - paresthesias Magnesium (unsure of dose)  LABS: CBC    Component Value Date/Time   WBC 12.1 (H) 05/15/2021 0430   RBC 3.43 (L) 05/15/2021 0430   HGB 9.4 (L) 05/15/2021 0430   HGB 12.2 05/05/2021 0953   HCT 29.8  (L) 05/15/2021 0430   HCT 37.7 05/05/2021 0953   PLT 354 05/15/2021 0430   PLT 324 05/05/2021 0953   MCV 86.9 05/15/2021 0430   MCV 85 05/05/2021 0953   MCH 27.4 05/15/2021 0430   MCHC 31.5 05/15/2021 0430   RDW 14.4 05/15/2021 0430   RDW 13.7 05/05/2021 0953   LYMPHSABS 2.0 05/15/2021 0430   LYMPHSABS 2.2 11/15/2020 1121   MONOABS 0.9 05/15/2021 0430   EOSABS 0.2 05/15/2021 0430   EOSABS 0.1 11/15/2020 1121   BASOSABS 0.0 05/15/2021 0430   BASOSABS 0.0 11/15/2020 1121      Latest Ref Rng & Units 05/15/2021    4:30 AM 05/14/2021    1:20 PM 05/09/2021    1:30 AM  CMP  Glucose 70 - 99 mg/dL 103  95  99   BUN 6 - 20 mg/dL 6  8  11   $ Creatinine 0.44 - 1.00 mg/dL 0.65  0.75  0.71   Sodium 135 - 145 mmol/L 140  142  136   Potassium 3.5 - 5.1 mmol/L 3.7  3.9  4.1   Chloride 98 - 111 mmol/L 104  109  104   CO2 22 - 32 mmol/L 27  24  24   $ Calcium 8.9 - 10.3 mg/dL 8.0  8.7  9.4   Total Protein 6.5 - 8.1 g/dL 5.8  5.2  5.8   Total Bilirubin 0.3 - 1.2 mg/dL 0.4  0.4  0.4   Alkaline Phos 38 - 126 U/L 97  92  127  AST 15 - 41 U/L 32  34  27   ALT 0 - 44 U/L 29  27  19      $ IMAGING:  none  Current Outpatient Medications on File Prior to Visit  Medication Sig Dispense Refill   ibuprofen (ADVIL) 600 MG tablet Take 1 tablet (600 mg total) by mouth every 6 (six) hours. 30 tablet 0   Magnesium 250 MG TABS      Prenatal Vit-Fe Fumarate-FA (PRENATAL MULTIVITAMIN) TABS tablet Take 1 tablet by mouth daily at 12 noon.     valACYclovir (VALTREX) 1000 MG tablet Take 1 tablet (1,000 mg total) by mouth 2 (two) times daily. X 1 day for fever blister 2 tablet 6   VITAMIN D, CHOLECALCIFEROL, PO Take by mouth.     metoCLOPramide (REGLAN) 10 MG tablet Take 1 tablet (10 mg total) by mouth 3 (three) times daily for 14 days. 42 tablet 0   NIFEdipine (PROCARDIA-XL/NIFEDICAL-XL) 30 MG 24 hr tablet Take 1 tablet (30 mg total) by mouth daily. Can increase to twice a day as needed for symptomatic contractions  30 tablet 2   No current facility-administered medications on file prior to visit.     Allergies: Allergies  Allergen Reactions   Codeine Nausea And Vomiting   Hydrocodone-Acetaminophen Nausea And Vomiting    Family History: Migraine or other headaches in the family:  no Aneurysms in a first degree relative:  no Brain tumors in the family:  no Other neurological illness in the family:   no  Past Medical History: Past Medical History:  Diagnosis Date   Anxiety    Asthma    Bipolar disorder ([place])    Depression    Heart murmur    Lipoma of back    MDD (major depressive disorder)    PMDD (premenstrual dysphoric disorder)    Pregnancy induced hypertension    Severe preeclampsia, postpartum condition 05/09/2021    Past Surgical History Past Surgical History:  Procedure Laterality Date   CESAREAN SECTION N/A 05/10/2021   Procedure: CESAREAN SECTION;  Surgeon: Gwynne Edinger, MD;  Location: [facility];  Service: Obstetrics;  Laterality: N/A;   LIPOMA EXCISION N/A 01/01/2020   Procedure: EXCISION of LIPOMA from BACK;  Surgeon: Aviva Signs, MD;  Location: [facility];  Service: General;  Laterality: N/A;    Social History: Social History   Tobacco Use   Smoking status: Former    Packs/day: 0.25    Years: 4.00    Total pack years: 1.00    Types: Cigarettes    Quit date: 05/27/2014    Years since quitting: 7.8   Smokeless tobacco: Never  Vaping Use   Vaping Use: Never used  Substance Use Topics   Alcohol use: Not Currently    Comment: rarely   Drug use: No     ROS: Negative for fevers, chills. Positive for headaches. All other systems reviewed and negative unless stated otherwise in HPI.   Physical Exam:   Vital Signs: BP 135/81   Pulse 84   Ht 5' 5"$  (1.651 m)   Wt 247 lb 9.6 oz (112.3 kg)   BMI 41.20 kg/m  GENERAL: well appearing,in no acute distress,alert SKIN:  Color, texture, turgor normal. No rashes or lesions HEAD:  Normocephalic/atraumatic. CV:   RRR RESP: Normal respiratory effort MSK: +tenderness to palpation over left occiput, neck, or shoulders  NEUROLOGICAL: Mental Status: Alert, oriented to person, place and time,Follows commands Cranial Nerves: PERRL, visual fields intact to confrontation, extraocular  movements intact, facial sensation intact, no facial droop or ptosis, hearing grossly intact, no dysarthria Motor: muscle strength 5/5 both upper and lower extremities,no drift, normal tone Reflexes: 2+ throughout Sensation: intact to light touch all 4 extremities Coordination: Finger-to- nose-finger intact bilaterally Gait: normal-based   IMPRESSION: 32 year old female with a history of anxiety who presents for evaluation of severe headaches and facial numbness. She is 11 months postpartum and currently breastfeeding. Will order MRI/MRV to rule out underlying structural causes of new unilateral headaches postpartum, including CVST. Discussed treatment options in breastfeeding. She is already taking daily magnesium. Will start riboflavin daily for headache prevention. Discussed neck PT and occipital nerve blocks as possible next steps if headaches persist.  PLAN: -MRI, MRV brain -Prevention: Start vitamin B2 200 mg BID, continue daily magnesium -Rescue: Ibuprofen -Next steps: consider neck PT, occipital nerve block   I spent a total of 24 minutes chart reviewing and counseling the patient. Headache education was done. Discussed treatment options including acute medications, natural supplements, and physical therapy. Discussed medication side effects, adverse reactions and drug interactions. Written educational materials and patient instructions outlining all of the above were given.  Follow-up: 7 months   Genia Harold, MD 04/06/2022   9:09 AM

## 2022-04-06 ENCOUNTER — Encounter: Payer: Self-pay | Admitting: Psychiatry

## 2022-04-06 ENCOUNTER — Ambulatory Visit (INDEPENDENT_AMBULATORY_CARE_PROVIDER_SITE_OTHER): Payer: BC Managed Care – PPO | Admitting: Psychiatry

## 2022-04-06 VITALS — BP 135/81 | HR 84 | Ht 65.0 in | Wt 247.6 lb

## 2022-04-06 DIAGNOSIS — G43109 Migraine with aura, not intractable, without status migrainosus: Secondary | ICD-10-CM | POA: Diagnosis not present

## 2022-04-06 DIAGNOSIS — R519 Headache, unspecified: Secondary | ICD-10-CM

## 2022-04-06 DIAGNOSIS — G4489 Other headache syndrome: Secondary | ICD-10-CM | POA: Diagnosis not present

## 2022-04-06 NOTE — Patient Instructions (Addendum)
Plan: MRI and MRV of the brain. You will receive a call to schedule these once your insurance approves them Take magnesium oxide 400 mg daily and vitamin B2 200 mg twice a day for headache prevention  Migraine Aura: Migraines can be preceded by neurological symptoms called auras. Auras can have many different symptoms including vision changes (zigzags, kaleidoscope vision, flashing lights, blurred vision), numbness and tingling (usually on once side of the hand or face), difficulty with language (reading or speaking), and weakness on one side. Many times these symptoms will occur before a migraine headache, but auras can occur without a headache as well. Auras can naturally change over time and may convert from one type to another (people with visual aura may eventually develop a sensory aura, etc).  Migraine and Breastfeeding:  Data is limiting regarding the safety of migraine medications during breastfeeding. These are some of the medications which appear to be safe during lactation.  Rescue Medications  -Ibuprofen is the preferred NSAID to use while breastfeeding -Tylenol -Eletriptan. Exposure to infant can be minimized by discarding breast milk for 24 hours after dose. -Sumatriptan. Exposure to infant can be minimized by discarding breast milk for 12 hours after dose.  Preventive Supplements -Magnesium oxide 400 mg daily -Riboflavin (vitamin B2) 200 mg twice a day  Non-medication options: -Physical therapy for the neck -Occipital nerve blocks -Acupuncture -Biofeedback/Relaxation techniques  Preventive Medications -Verapamil -Gabapentin - monitor for drowsiness -Propranolol, labetalol - monitor for low heart rate, low blood sugar

## 2022-04-16 ENCOUNTER — Telehealth: Payer: Self-pay | Admitting: Psychiatry

## 2022-04-16 NOTE — Telephone Encounter (Signed)
Megan Houston: [id number] exp. 04/16/22-3/26/24[place] medicaid NPR went secondary insurance per NIA, sent to GI [phone]

## 2022-04-18 ENCOUNTER — Emergency Department (HOSPITAL_BASED_OUTPATIENT_CLINIC_OR_DEPARTMENT_OTHER)
Admission: EM | Admit: 2022-04-18 | Discharge: 2022-04-19 | Disposition: A | Discharge status: Home / Self Care | Payer: BC Managed Care – PPO | Attending: Emergency Medicine | Admitting: Emergency Medicine

## 2022-04-18 ENCOUNTER — Encounter: Payer: Self-pay | Admitting: Psychiatry

## 2022-04-18 ENCOUNTER — Other Ambulatory Visit: Payer: Self-pay

## 2022-04-18 ENCOUNTER — Emergency Department (HOSPITAL_BASED_OUTPATIENT_CLINIC_OR_DEPARTMENT_OTHER): Payer: BC Managed Care – PPO

## 2022-04-18 ENCOUNTER — Encounter (HOSPITAL_BASED_OUTPATIENT_CLINIC_OR_DEPARTMENT_OTHER): Payer: Self-pay

## 2022-04-18 DIAGNOSIS — R519 Headache, unspecified: Secondary | ICD-10-CM | POA: Diagnosis not present

## 2022-04-18 DIAGNOSIS — R42 Dizziness and giddiness: Secondary | ICD-10-CM | POA: Diagnosis not present

## 2022-04-18 DIAGNOSIS — R202 Paresthesia of skin: Secondary | ICD-10-CM | POA: Insufficient documentation

## 2022-04-18 DIAGNOSIS — Z0389 Encounter for observation for other suspected diseases and conditions ruled out: Secondary | ICD-10-CM | POA: Diagnosis not present

## 2022-04-18 LAB — BASIC METABOLIC PANEL
Anion gap: 8 (ref 5–15)
BUN: 19 mg/dL (ref 6–20)
CO2: 27 mmol/L (ref 22–32)
Calcium: 9.7 mg/dL (ref 8.9–10.3)
Chloride: 106 mmol/L (ref 98–111)
Creatinine, Ser: 0.88 mg/dL (ref 0.44–1.00)
GFR, Estimated: >60 mL/min (ref >60)
Glucose, Bld: 93 mg/dL (ref 70–99)
Potassium: 3.8 mmol/L (ref 3.5–5.1)
Sodium: 141 mmol/L (ref 135–145)

## 2022-04-18 LAB — CBC WITH DIFFERENTIAL/PLATELET
Abs Immature Granulocytes: 0.03 10*3/uL (ref 0.00–0.07)
Basophils Absolute: 0 10*3/uL (ref 0.0–0.1)
Basophils Relative: 0 %
Eosinophils Absolute: 0.1 10*3/uL (ref 0.0–0.5)
Eosinophils Relative: 2 %
HCT: 42.5 % (ref 36.0–46.0)
Hemoglobin: 13.8 g/dL (ref 12.0–15.0)
Immature Granulocytes: 0 %
Lymphocytes Relative: 27 %
Lymphs Abs: 2.4 10*3/uL (ref 0.7–4.0)
MCH: 27.2 pg (ref 26.0–34.0)
MCHC: 32.5 g/dL (ref 30.0–36.0)
MCV: 83.8 fL (ref 80.0–100.0)
Monocytes Absolute: 0.8 10*3/uL (ref 0.1–1.0)
Monocytes Relative: 9 %
Neutro Abs: 5.5 10*3/uL (ref 1.7–7.7)
Neutrophils Relative %: 62 %
Platelets: 291 10*3/uL (ref 150–400)
RBC: 5.07 MIL/uL (ref 3.87–5.11)
RDW: 13.2 % (ref 11.5–15.5)
WBC: 8.8 10*3/uL (ref 4.0–10.5)
nRBC: 0 % (ref 0.0–0.2)

## 2022-04-18 MED ORDER — IOHEXOL 350 MG/ML SOLN
100.0000 mL | Freq: Once | INTRAVENOUS | Status: AC | PRN
  Administered 2022-04-18: 75 mL via INTRAVENOUS

## 2022-04-18 NOTE — Telephone Encounter (Signed)
I've changed these orders to stat to see if we can get her scheduled sooner. If she continues to have these symptoms and we can't get her to MRI within the next few days, I recommend she go to the emergency room for sooner evaluation

## 2022-04-18 NOTE — Discharge Instructions (Signed)
Please have your self driven directly to the [facility] emergency department at [address] in [place].  You will check into the emergency department and the MRI scan is ordered for your brain.

## 2022-04-18 NOTE — Telephone Encounter (Signed)
[facility] can't get her in sooner right now but will call her if something comes available.

## 2022-04-18 NOTE — ED Notes (Signed)
Reviewed instructions with patient, patient to report to [facility] ed for MRI

## 2022-04-18 NOTE — ED Provider Notes (Signed)
[facility] Provider Note   CSN: [id number] Arrival date & time: 04/18/22  1911     History  Chief Complaint  Patient presents with   Dizziness    Megan Houston is a 32 y.o. female presented to ED with headache and paresthesias.  Patient reports symptoms ongoing for approximately 3 weeks, which she states began near the time of her delivery of her newborn infant.  She says that she experienced preeclampsia during her pregnancy.  But following the delivery of her child she began having intermittent paresthesias in the right of her face and has had near daily headaches on the right side of her head.  She was seen by neurology for these postpartum headaches, ultimately felt the patient may need an MRI and MRV of the brain to evaluate for possible structural causes of unilateral headaches including CVST.  The patient does not have an MRI scheduled until March 15 and is concerned that her headaches are getting more frequent and her symptoms are more persistent, prompting her visit to the ER today.  She says she intermittently sometimes has "spots in my vision".  She also reports that her right toe at times will feel numb.  Denies family history of autoimmune disease, MS, lupus.  She reports she herself had a history of migraines in her early 32s but never had persistent headaches like this or neurological symptoms  HPI     Home Medications Prior to Admission medications   Medication Sig Start Date End Date Taking? Authorizing Provider  Magnesium 250 MG TABS  12/20/20  Yes [provider]  Prenatal Vit-Fe Fumarate-FA (PRENATAL MULTIVITAMIN) TABS tablet Take 1 tablet by mouth daily at 12 noon.   Yes [provider]  VITAMIN D, CHOLECALCIFEROL, PO Take by mouth.   Yes [provider]  ibuprofen (ADVIL) 600 MG tablet Take 1 tablet (600 mg total) by mouth every 6 (six) hours. 05/12/21   Cresenzo-Dishmon, Joaquim Lai, CNM   valACYclovir (VALTREX) 1000 MG tablet Take 1 tablet (1,000 mg total) by mouth 2 (two) times daily. X 1 day for fever blister 08/21/21   Roma Schanz, CNM      Allergies    Codeine and Hydrocodone-acetaminophen    Review of Systems   Review of Systems  Physical Exam Updated Vital Signs BP 109/72   Pulse 74   Temp 98.4 F (36.9 C) (Oral)   Resp 14   Ht '5\' 5"'$  (1.651 m)   Wt 110.9 kg   SpO2 97%   Breastfeeding Yes   BMI 40.69 kg/m  Physical Exam Constitutional:      General: She is not in acute distress. HENT:     Head: Normocephalic and atraumatic.  Eyes:     Conjunctiva/sclera: Conjunctivae normal.     Pupils: Pupils are equal, round, and reactive to light.  Cardiovascular:     Rate and Rhythm: Normal rate and regular rhythm.  Pulmonary:     Effort: Pulmonary effort is normal. No respiratory distress.  Abdominal:     General: There is no distension.     Tenderness: There is no abdominal tenderness.  Skin:    General: Skin is warm and dry.  Neurological:     General: No focal deficit present.     Mental Status: She is alert and oriented to person, place, and time. Mental status is at baseline.     Cranial Nerves: No cranial nerve deficit.     Sensory: No sensory  deficit.     Motor: No weakness.  Psychiatric:        Mood and Affect: Mood normal.        Behavior: Behavior normal.     ED Results / Procedures / Treatments   Labs (all labs ordered are listed, but only abnormal results are displayed) Labs Reviewed  BASIC METABOLIC PANEL  CBC WITH DIFFERENTIAL/PLATELET    EKG None  Radiology CT VENOGRAM HEAD  Result Date: 04/18/2022 CLINICAL DATA:  Initial evaluation for headache. EXAM: CT VENOGRAM HEAD TECHNIQUE: Venographic phase images of the brain were obtained following the administration of intravenous contrast. Multiplanar reformats and maximum intensity projections were generated. RADIATION DOSE REDUCTION: This exam was performed according to the  departmental dose-optimization program which includes automated exposure control, adjustment of the mA and/or kV according to patient size and/or use of iterative reconstruction technique. CONTRAST:  32m OMNIPAQUE IOHEXOL 350 MG/ML SOLN COMPARISON:  None Available. FINDINGS: Brain: Cerebral volume within normal limits for patient age. No evidence for acute intracranial hemorrhage. No findings to suggest acute large vessel territory infarct. No mass lesion, midline shift, or mass effect. Ventricles are normal in size without evidence for hydrocephalus. No extra-axial fluid collection identified. Vascular: No hyperdense vessel seen prior to contrast administration. Following contrast administration, normal enhancement seen throughout the superior sagittal sinus to the torcula. Transverse and sigmoid sinuses are patent as are the jugular bulbs and visualized proximal internal jugular veins. Straight sinus, vein of Galen, and internal cerebral veins are patent. No appreciable abnormality about the cavernous sinus. No appreciable cortical vein abnormality. Skull: Scalp soft tissues demonstrate no acute abnormality. Calvarium intact. Sinuses/Orbits: Globes and orbital soft tissues within normal limits. Visualized paranasal sinuses are clear. No mastoid effusion. IMPRESSION: 1. Normal CT venogram. No evidence for dural sinus thrombosis. 2. No other acute intracranial abnormality. Electronically Signed   By: BJeannine BogaM.D.   On: 04/18/2022 22:21    Procedures Procedures    Medications Ordered in ED Medications  iohexol (OMNIPAQUE) 350 MG/ML injection 100 mL (75 mLs Intravenous Contrast Given 04/18/22 2142)    ED Course/ Medical Decision Making/ A&P Clinical Course as of 04/18/22 2331  Wed Apr 18, 2022  2249 Dr KLorrin Goodellfrom neurology recommending transfer for MRI brain [facility] [MT]    Clinical Course User Index [MT] Cleatus Gabriel, MCarola Rhine MD                              Medical Decision Making Amount and/or Complexity of Data Reviewed Labs: ordered. Radiology: ordered.  Risk Prescription drug management.   This patient presents to the Emergency Department with complaint of headache.  This involves an extensive number of treatment options, and is a complaint that carries with it a high risk of complications and morbidity.  The differential diagnosis for headache includes tension type headache vs occipital headache vs migraine vs sinusitis vs intracranial bleed vs other  I agree with neurology office assessment that venous thrombosis would be on the differential.  I do not see evidence of proptosis or unequal pupils suggest brain aneurysm at this time.  CT venogram however is ordered.  I ordered, reviewed, and interpreted labs, including BMP and CBC.  There were no immediate, life-threatening emergencies found in this labwork.   I ordered imaging studies which included CT venogram I independently visualized and interpreted imaging which showed no emergent findings and  the monitor tracing which showed normal this rhythm External records reviewed including outpatient neurology office evaluation for postpartum headache 2 weeks ago  I spoke to the neurologist by phone and we will plan to transfer the patient to [facility] to complete her workup with an MRI of the brain.  At this time the patient remained stable, no confusion.  Patient is accepted for transfer to [facility] emergency department by Dr Leonette Monarch and Christy Gentles.         Final Clinical Impression(s) / ED Diagnoses Final diagnoses:  Nonintractable headache, unspecified chronicity pattern, unspecified headache type    Rx / DC Orders ED Discharge Orders     None         Wyvonnia Dusky, MD 04/18/22 2333

## 2022-04-18 NOTE — ED Triage Notes (Signed)
Pt reports months of dizziness worsened on Monday night, tingling and numbness in lips yesterday, states she saw her neurologist a week ago, told her to come to ED for concern of blood clot in her brain. Also endorses HA x 3 weeks. 11 months post partum.

## 2022-04-19 ENCOUNTER — Emergency Department (HOSPITAL_COMMUNITY): Payer: BC Managed Care – PPO

## 2022-04-19 DIAGNOSIS — R519 Headache, unspecified: Secondary | ICD-10-CM | POA: Diagnosis not present

## 2022-04-19 NOTE — ED Notes (Addendum)
Pt arrives pov from [facility] for MRI brain. Pt c/o dizziness for months that worsened Monday night.

## 2022-04-19 NOTE — ED Provider Notes (Signed)
Transferred here from [facility] for MRI brain due to headaches x3 weeks and some paresthesias.  CT venogram done earlier today which is negative.   Results for orders placed or performed during the hospital encounter of [id number]  Basic metabolic panel  Result Value Ref Range   Sodium 141 135 - 145 mmol/L   Potassium 3.8 3.5 - 5.1 mmol/L   Chloride 106 98 - 111 mmol/L   CO2 27 22 - 32 mmol/L   Glucose, Bld 93 70 - 99 mg/dL   BUN 19 6 - 20 mg/dL   Creatinine, Ser 0.88 0.44 - 1.00 mg/dL   Calcium 9.7 8.9 - 10.3 mg/dL   GFR, Estimated >60 >60 mL/min   Anion gap 8 5 - 15  CBC with Differential  Result Value Ref Range   WBC 8.8 4.0 - 10.5 K/uL   RBC 5.07 3.87 - 5.11 MIL/uL   Hemoglobin 13.8 12.0 - 15.0 g/dL   HCT 42.5 36.0 - 46.0 %   MCV 83.8 80.0 - 100.0 fL   MCH 27.2 26.0 - 34.0 pg   MCHC 32.5 30.0 - 36.0 g/dL   RDW 13.2 11.5 - 15.5 %   Platelets 291 150 - 400 K/uL   nRBC 0.0 0.0 - 0.2 %   Neutrophils Relative % 62 %   Neutro Abs 5.5 1.7 - 7.7 K/uL   Lymphocytes Relative 27 %   Lymphs Abs 2.4 0.7 - 4.0 K/uL   Monocytes Relative 9 %   Monocytes Absolute 0.8 0.1 - 1.0 K/uL   Eosinophils Relative 2 %   Eosinophils Absolute 0.1 0.0 - 0.5 K/uL   Basophils Relative 0 %   Basophils Absolute 0.0 0.0 - 0.1 K/uL   Immature Granulocytes 0 %   Abs Immature Granulocytes 0.03 0.00 - 0.07 K/uL   MR BRAIN WO CONTRAST  Result Date: 04/19/2022 CLINICAL DATA:  Initial evaluation for acute headache. EXAM: MRI HEAD WITHOUT CONTRAST TECHNIQUE: Multiplanar, multiecho pulse sequences of the brain and surrounding structures were obtained without intravenous contrast. COMPARISON:  Prior study from 04/18/2022. FINDINGS: Brain: Cerebral volume within normal limits for age. No focal parenchymal signal abnormality. No abnormal foci of restricted diffusion to suggest acute or subacute ischemia. Gray-white matter differentiation well maintained. No encephalomalacia to suggest chronic cortical infarction or other  insult. No foci of susceptibility artifact indicative of acute or chronic intracranial blood products. No mass lesion, midline shift or mass effect. Ventricles normal in size and morphology without hydrocephalus. No extra-axial fluid collection. Pituitary gland and suprasellar region within normal limits. Vascular: Major intracranial vascular flow voids are well maintained. Skull and upper cervical spine: Craniocervical junction within normal limits. Visualized upper cervical spine demonstrates no significant finding. Bone marrow signal intensity within normal limits. No scalp soft tissue abnormality. Sinuses/Orbits: Globes and orbital soft tissues are within normal limits. Paranasal sinuses are largely clear. No significant mastoid effusion. Other: None. IMPRESSION: Normal brain MRI. No acute intracranial abnormality. Electronically Signed   By: Jeannine Boga M.D.   On: 04/19/2022 01:27   CT VENOGRAM HEAD  Result Date: 04/18/2022 CLINICAL DATA:  Initial evaluation for headache. EXAM: CT VENOGRAM HEAD TECHNIQUE: Venographic phase images of the brain were obtained following the administration of intravenous contrast. Multiplanar reformats and maximum intensity projections were generated. RADIATION DOSE REDUCTION: This exam was performed according to the departmental dose-optimization program which includes automated exposure control, adjustment of the mA and/or kV according to patient size and/or use of iterative reconstruction technique. CONTRAST:  26m OMNIPAQUE  IOHEXOL 350 MG/ML SOLN COMPARISON:  None Available. FINDINGS: Brain: Cerebral volume within normal limits for patient age. No evidence for acute intracranial hemorrhage. No findings to suggest acute large vessel territory infarct. No mass lesion, midline shift, or mass effect. Ventricles are normal in size without evidence for hydrocephalus. No extra-axial fluid collection identified. Vascular: No hyperdense vessel seen prior to contrast  administration. Following contrast administration, normal enhancement seen throughout the superior sagittal sinus to the torcula. Transverse and sigmoid sinuses are patent as are the jugular bulbs and visualized proximal internal jugular veins. Straight sinus, vein of Galen, and internal cerebral veins are patent. No appreciable abnormality about the cavernous sinus. No appreciable cortical vein abnormality. Skull: Scalp soft tissues demonstrate no acute abnormality. Calvarium intact. Sinuses/Orbits: Globes and orbital soft tissues within normal limits. Visualized paranasal sinuses are clear. No mastoid effusion. IMPRESSION: 1. Normal CT venogram. No evidence for dural sinus thrombosis. 2. No other acute intracranial abnormality. Electronically Signed   By: Jeannine Boga M.D.   On: 04/18/2022 22:21    MRI without acute findings.  Results discussed with neuro, no further work-up indicated and is stable for discharge.  She is established with [facility] and will follow-up with them.  Can return here for new concerns.   Larene Pickett, PA-C 04/19/22 0201    Fatima Blank, MD 04/19/22 [phone]

## 2022-04-23 NOTE — Telephone Encounter (Signed)
She doesn't need to get another MRI done. I'm able to see The MRI she had done last Wednesday and everything looks normal

## 2022-05-04 ENCOUNTER — Other Ambulatory Visit: Payer: BC Managed Care – PPO

## 2022-06-23 ENCOUNTER — Ambulatory Visit
Admission: EM | Admit: 2022-06-23 | Discharge: 2022-06-23 | Disposition: A | Discharge status: Home / Self Care | Payer: BC Managed Care – PPO | Attending: Family Medicine | Admitting: Family Medicine

## 2022-06-23 DIAGNOSIS — F330 Major depressive disorder, recurrent, mild: Secondary | ICD-10-CM | POA: Diagnosis not present

## 2022-06-23 DIAGNOSIS — F411 Generalized anxiety disorder: Secondary | ICD-10-CM | POA: Diagnosis not present

## 2022-06-23 DIAGNOSIS — J029 Acute pharyngitis, unspecified: Secondary | ICD-10-CM | POA: Diagnosis not present

## 2022-06-23 LAB — POCT RAPID STREP A (OFFICE): Rapid Strep A Screen: NEGATIVE

## 2022-06-23 MED ORDER — LIDOCAINE VISCOUS HCL 2 % MT SOLN: 5.0000 mL | Freq: Four times a day (QID) | OROMUCOSAL | 0 refills | Status: DC | PRN

## 2022-06-23 MED ORDER — AMOXICILLIN 875 MG PO TABS: 875.0000 mg | ORAL_TABLET | Freq: Two times a day (BID) | ORAL | 0 refills | Status: DC

## 2022-06-23 MED ORDER — PREDNISONE 20 MG PO TABS: 40.0000 mg | ORAL_TABLET | Freq: Every day | ORAL | 0 refills | Status: DC

## 2022-06-23 NOTE — Discharge Instructions (Addendum)
You may use over the counter ibuprofen or acetaminophen as needed.  For a sore throat, over the counter products such as [employer] Peroxyl Mouth Sore Rinse or Chloraseptic Sore Throat Spray may provide some temporary relief. Your rapid strep test was negative today. We have sent your throat swab for culture and will let you know of any positive results. 

## 2022-06-23 NOTE — ED Triage Notes (Signed)
Sore throat that started Thursday. Taking tylenol and ibuprofen. Pt is currently breastfeeding.

## 2022-06-25 ENCOUNTER — Ambulatory Visit
Admission: RE | Admit: 2022-06-25 | Discharge: 2022-06-25 | Disposition: A | Discharge status: Home / Self Care | Payer: BC Managed Care – PPO | Source: Ambulatory Visit | Attending: Family Medicine | Admitting: Family Medicine

## 2022-06-25 ENCOUNTER — Ambulatory Visit: Payer: BC Managed Care – PPO

## 2022-06-25 ENCOUNTER — Telehealth (HOSPITAL_COMMUNITY): Payer: Self-pay | Admitting: Emergency Medicine

## 2022-06-25 LAB — CULTURE, GROUP A STREP (THRC)

## 2022-06-25 MED ORDER — AMOXICILLIN 500 MG PO CAPS: 500.0000 mg | ORAL_CAPSULE | Freq: Two times a day (BID) | ORAL | 0 refills | Status: AC

## 2022-06-25 NOTE — ED Notes (Signed)
Patient had positive strep culture, was there for strep throat, and noted by this RN while in office.  We reviewed, followed protocol treatment, and patient left.

## 2022-06-25 NOTE — ED Provider Notes (Signed)
[facility]   [id number] 06/23/22 Arrival Time: 1440  ASSESSMENT & PLAN:  1. Sore throat    No signs of peritonsillar abscess. Discussed.  Meds ordered this encounter  Medications   predniSONE (DELTASONE) 20 MG tablet    Sig: Take 2 tablets (40 mg total) by mouth daily.    Dispense:  10 tablet    Refill:  0   amoxicillin (AMOXIL) 875 MG tablet    Sig: Take 1 tablet (875 mg total) by mouth 2 (two) times daily for 10 days.    Dispense:  20 tablet    Refill:  0   magic mouthwash (lidocaine, diphenhydrAMINE, alum & mag hydroxide) suspension    Sig: Swish and spit 5 mLs 4 (four) times daily as needed for mouth pain.    Dispense:  360 mL    Refill:  0   Suspicious for strep throat. Will treat empirically as above.  Results for orders placed or performed during the hospital encounter of 06/23/22  Culture, group A strep   Specimen: Throat  Result Value Ref Range   Specimen Description      THROAT Performed at [facility], [address], [place], [place] [address]    Special Requests      NONE Performed at [facility], [address], [place], [place] [address]    Culture      TOO YOUNG TO READ Performed at [facility], [address] [place], [place] [address]    Report Status PENDING   POCT rapid strep A  Result Value Ref Range   Rapid Strep A Screen Negative Negative   Labs Reviewed  CULTURE, GROUP A STREP [facility])  POCT RAPID STREP A (OFFICE)    OTC analgesics and throat care as needed     Discharge Instructions      You may use over the counter ibuprofen or acetaminophen as needed.  For a sore throat, over the counter products such as Colgate Peroxyl Mouth Sore Rinse or Chloraseptic Sore Throat Spray may provide some temporary relief. Your rapid strep test was negative today. We have sent your throat swab for culture and will let you know of any positive results.    Reviewed expectations re: course of current medical issues. Questions  answered. Outlined signs and symptoms indicating need for more acute intervention. Patient verbalized understanding. After Visit Summary given.   SUBJECTIVE:  Megan Houston is a 32 y.o. female who reports a sore throat; few days; abrupt onset. Painful swallowing. Ques low grade temp. No resp symptoms. Is breastfeeding. No rashes   OBJECTIVE:  Vitals:   06/23/22 1508  BP: 126/84  Pulse: 76  Resp: 16  Temp: 98.7 F (37.1 C)  TempSrc: Oral  SpO2: 98%     General appearance: alert; no distress HEENT: throat with moderate erythema and with bilateral tonsillar hypertrophy; uvula is midline Neck: supple with FROM; small bilat LAD Lungs: speaks full sentences without difficulty; unlabored Abd: soft; non-tender Skin: reveals no rash; warm and dry Psychological: alert and cooperative; normal mood and affect  Allergies  Allergen Reactions   Codeine Nausea And Vomiting   Hydrocodone-Acetaminophen Nausea And Vomiting    Past Medical History:  Diagnosis Date   Anxiety    Asthma    Bipolar disorder (HCC)    Depression    Heart murmur    Lipoma of back    MDD (major depressive disorder)    PMDD (premenstrual dysphoric disorder)    Pregnancy induced  hypertension    Severe preeclampsia, postpartum condition 05/09/2021   Social History   Socioeconomic History   Marital status: Married    Spouse name: Not on file   Number of children: 0   Years of education: Not on file   Highest education level: Some college, no degree  Occupational History   Not on file  Tobacco Use   Smoking status: Former    Packs/day: 0.25    Years: 4.00    Additional pack years: 0.00    Total pack years: 1.00    Types: Cigarettes    Quit date: 05/27/2014    Years since quitting: 8.0   Smokeless tobacco: Never  Vaping Use   Vaping Use: Never used  Substance and Sexual Activity   Alcohol use: Not Currently    Comment: rarely   Drug use: No   Sexual activity: Not Currently    Birth  control/protection: None  Other Topics Concern   Not on file  Social History Narrative   Not on file   Social Determinants of Health   Financial Resource Strain: Low Risk  (06/14/2021)   Overall Financial Resource Strain (CARDIA)    Difficulty of Paying Living Expenses: Not very hard  Food Insecurity: No Food Insecurity (06/14/2021)   Hunger Vital Sign    Worried About Running Out of Food in the Last Year: Never true    Ran Out of Food in the Last Year: Never true  Transportation Needs: No Transportation Needs (06/14/2021)   PRAPARE - Administrator, Civil Service (Medical): No    Lack of Transportation (Non-Medical): No  Physical Activity: Inactive (06/14/2021)   Exercise Vital Sign    Days of Exercise per Week: 0 days    Minutes of Exercise per Session: 0 min  Stress: No Stress Concern Present (06/14/2021)   [employer] of Occupational Health - Occupational Stress Questionnaire    Feeling of Stress : Not at all  Social Connections: Moderately Isolated (06/14/2021)   Social Connection and Isolation Panel [NHANES]    Frequency of Communication with Friends and Family: More than three times a week    Frequency of Social Gatherings with Friends and Family: Twice a week    Attends Religious Services: Never    Database administrator or Organizations: No    Attends Banker Meetings: Never    Marital Status: Married  Catering manager Violence: Not At Risk (06/14/2021)   Humiliation, Afraid, Rape, and Kick questionnaire    Fear of Current or Ex-Partner: No    Emotionally Abused: No    Physically Abused: No    Sexually Abused: No   Family History  Problem Relation Age of Onset   Liver disease Paternal Grandmother    Diabetes Maternal Grandmother    Bipolar disorder Maternal Grandmother    Anxiety disorder Maternal Grandmother    Schizophrenia Maternal Grandmother    OCD Father    Anxiety disorder Father    Diabetes Father    Hypertension Father     Diabetes Mother    Hypertension Mother    Bipolar disorder Mother    Alcohol abuse Mother    Drug abuse Mother    Cancer Mother        cervical   Bipolar disorder Sister    Drug abuse Sister    Alcohol abuse Sister    Hypertension Paternal Aunt    Diabetes Paternal Aunt    Hypertension Other    Diabetes Other  Mardella Layman, MD 06/25/22 [phone]

## 2022-07-06 ENCOUNTER — Encounter: Payer: Self-pay | Admitting: Obstetrics & Gynecology

## 2022-07-06 ENCOUNTER — Ambulatory Visit (INDEPENDENT_AMBULATORY_CARE_PROVIDER_SITE_OTHER): Payer: BC Managed Care – PPO | Admitting: Obstetrics & Gynecology

## 2022-07-06 ENCOUNTER — Other Ambulatory Visit (HOSPITAL_COMMUNITY)
Admission: RE | Admit: 2022-07-06 | Discharge: 2022-07-06 | Disposition: A | Discharge status: Home / Self Care | Payer: BC Managed Care – PPO | Source: Ambulatory Visit | Attending: Obstetrics & Gynecology | Admitting: Obstetrics & Gynecology

## 2022-07-06 VITALS — BP 116/81 | HR 80 | Ht 65.2 in | Wt 240.2 lb

## 2022-07-06 DIAGNOSIS — Z01419 Encounter for gynecological examination (general) (routine) without abnormal findings: Secondary | ICD-10-CM

## 2022-07-06 DIAGNOSIS — N898 Other specified noninflammatory disorders of vagina: Secondary | ICD-10-CM

## 2022-07-06 NOTE — Progress Notes (Signed)
WELL-WOMAN EXAMINATION Patient name: Megan Houston MRN [id number]  Date of birth: 01/27/1991 Chief Complaint:   Annual Exam (Pap/physcial)  History of Present Illness:   Megan Houston is a 32 y.o. G51P1001 female being seen today for a routine well-woman exam.   Today she notes firmness in her right breast.  She is currently breast-feeding.  She denies fevers chills.  Denies bloody discharge.  Notes no issues with feeding and actually feels as though her left is more productive than her right breast.  Menses have returned.  Denies heavy menstrual bleeding or dysmenorrhea.  Using condoms/pullout for contraception  She does report vaginal itching and slight discharge.  She just finished a course of antibiotic and thinks she may have a yeast infection.  Patient's last menstrual period was 06/07/2022 (exact date).  Last pap 2022.  Last mammogram: NA. Last colonoscopy: NA     07/06/2022   11:41 AM 03/08/2021    8:46 AM 11/15/2020   10:15 AM 10/20/2020    3:11 PM 09/29/2020    7:48 AM  Depression screen PHQ 2/9  Decreased Interest 0 0 0    Down, Depressed, Hopeless 0 0 0    PHQ - 2 Score 0 0 0    Altered sleeping 0 2 2    Tired, decreased energy 0 3 2    Change in appetite 0 0 0    Feeling bad or failure about yourself  0 0 0    Trouble concentrating 0 0 0    Moving slowly or fidgety/restless 0 0 0    Suicidal thoughts 0 0 0    PHQ-9 Score 0 5 4       Information is confidential and restricted. Go to Review Flowsheets to unlock data.      Review of Systems:   Pertinent items are noted in HPI Denies any headaches, blurred vision, fatigue, shortness of breath, chest pain, abdominal pain, bowel movements, urination, or intercourse unless otherwise stated above.  Pertinent History Reviewed:  Reviewed past medical,surgical, social and family history.  Reviewed problem list, medications and allergies. Physical Assessment:   Vitals:   07/06/22 1133  BP: 116/81  Pulse: 80   Weight: 240 lb 3.2 oz (109 kg)  Height: 5' 5.2" (1.656 m)  Body mass index is 39.73 kg/m.        Physical Examination:   General appearance - well appearing, and in no distress  Mental status - alert, oriented to person, place, and time  Psych:  She has a normal mood and affect  Skin - warm and dry, normal color, no suspicious lesions noted  Chest - effort normal, all lung fields clear to auscultation bilaterally  Heart - normal rate and regular rhythm  Neck:  midline trachea, no thyromegaly or nodules  Breasts - breasts appear normal, no suspicious masses, no skin or nipple changes or  axillary nodes. No acute abnormalities appreciated- some asymmetry of breast; however, no masses noted in right breast.  Abdomen - soft, nontender, nondistended, no masses or organomegaly  Pelvic - VULVA: normal appearing vulva with no masses, tenderness or lesions  VAGINA: normal appearing vagina with normal color and discharge, no lesions  CERVIX: normal appearing cervix without discharge or lesions, no CMT  Thin prep pap is done with HR HPV cotesting  UTERUS: uterus is felt to be normal size, shape, consistency and nontender   ADNEXA: No adnexal masses or tenderness noted.  Rectal - normal rectal, good sphincter tone, no  masses felt.  Extremities:  No swelling or varicosities noted  Chaperone: Engineer, civil (consulting) & Plan:  1) Well-Woman Exam -Pap collected per patient request -Reviewed ASCCP guidelines and the "why"  []  Pap will also check for yeast, will treat according to results  2) breast firmness -No abnormalities appreciated on exam -should abnormalities worsen or continue, []  plan for breast US for further evaluation   Meds: No orders of the defined types were placed in this encounter.   Follow-up: Return for Annual.   Myna Hidalgo, DO Attending Obstetrician & Gynecologist, [facility] for [facility], [facility]

## 2022-07-10 ENCOUNTER — Encounter: Payer: Self-pay | Admitting: Obstetrics & Gynecology

## 2022-07-12 LAB — CYTOLOGY - PAP
Adequacy: ABSENT
Comment: NEGATIVE
Diagnosis: NEGATIVE
High risk HPV: NEGATIVE

## 2022-07-30 ENCOUNTER — Encounter: Payer: Self-pay | Admitting: Women's Health

## 2022-07-30 ENCOUNTER — Other Ambulatory Visit (HOSPITAL_COMMUNITY)
Admission: RE | Admit: 2022-07-30 | Discharge: 2022-07-30 | Disposition: A | Discharge status: Home / Self Care | Payer: BC Managed Care – PPO | Source: Ambulatory Visit | Attending: Women's Health | Admitting: Women's Health

## 2022-07-30 ENCOUNTER — Ambulatory Visit: Payer: BC Managed Care – PPO | Admitting: Women's Health

## 2022-07-30 VITALS — BP 137/84 | HR 79 | Ht 65.2 in | Wt 242.4 lb

## 2022-07-30 DIAGNOSIS — R102 Pelvic and perineal pain: Secondary | ICD-10-CM | POA: Diagnosis not present

## 2022-07-30 DIAGNOSIS — R109 Unspecified abdominal pain: Secondary | ICD-10-CM

## 2022-07-30 DIAGNOSIS — Z113 Encounter for screening for infections with a predominantly sexual mode of transmission: Secondary | ICD-10-CM | POA: Diagnosis not present

## 2022-07-30 DIAGNOSIS — L292 Pruritus vulvae: Secondary | ICD-10-CM | POA: Insufficient documentation

## 2022-07-30 DIAGNOSIS — N898 Other specified noninflammatory disorders of vagina: Secondary | ICD-10-CM | POA: Diagnosis not present

## 2022-07-30 LAB — POCT URINALYSIS DIPSTICK OB
Blood, UA: NEGATIVE
Glucose, UA: NEGATIVE
Ketones, UA: NEGATIVE
Leukocytes, UA: NEGATIVE
Nitrite, UA: NEGATIVE
POC,PROTEIN,UA: NEGATIVE

## 2022-07-30 NOTE — Progress Notes (Signed)
GYN VISIT Patient name: Megan Houston MRN [id number]  Date of birth: 13-Nov-1990 Chief Complaint:   heavy vaginal discharge (Pressure and cramping)  History of Present Illness:   Megan Houston is a 32 y.o. G64P1001 Caucasian female being seen today for report of increased liquidy vaginal d/c, vulvar itching/irritation, no odor, +cramping and pressure for a few weeks.  Denies UTI sx.   Patient's last menstrual period was 06/07/2022. Last pap 07/06/22. Results were: NILM w/ HRHPV negative     07/06/2022   11:41 AM 03/08/2021    8:46 AM 11/15/2020   10:15 AM 10/20/2020    3:11 PM 09/29/2020    7:48 AM  Depression screen PHQ 2/9  Decreased Interest 0 0 0    Down, Depressed, Hopeless 0 0 0    PHQ - 2 Score 0 0 0    Altered sleeping 0 2 2    Tired, decreased energy 0 3 2    Change in appetite 0 0 0    Feeling bad or failure about yourself  0 0 0    Trouble concentrating 0 0 0    Moving slowly or fidgety/restless 0 0 0    Suicidal thoughts 0 0 0    PHQ-9 Score 0 5 4       Information is confidential and restricted. Go to Review Flowsheets to unlock data.        07/06/2022   11:41 AM 03/08/2021    8:46 AM 11/15/2020   10:15 AM 04/01/2020   10:52 AM  GAD 7 : Generalized Anxiety Score  Nervous, Anxious, on Edge 0 0 0 1  Control/stop worrying 0 0 0 1  Worry too much - different things 0 0 0 1  Trouble relaxing 0 0 0 1  Restless 0 0 0 1  Easily annoyed or irritable 0 0 0 1  Afraid - awful might happen 0 0 0 1  Total GAD 7 Score 0 0 0 7     Review of Systems:   Pertinent items are noted in HPI Denies fever/chills, dizziness, headaches, visual disturbances, fatigue, shortness of breath, chest pain, abdominal pain, vomiting, abnormal vaginal discharge/itching/odor/irritation, problems with periods, bowel movements, urination, or intercourse unless otherwise stated above.  Pertinent History Reviewed:  Reviewed past medical,surgical, social, obstetrical and family history.   Reviewed problem list, medications and allergies. Physical Assessment:   Vitals:   07/30/22 1606  BP: 137/84  Pulse: 79  Weight: 242 lb 6.4 oz (110 kg)  Height: 5' 5.2" (1.656 m)  Body mass index is 40.09 kg/m.       Physical Examination:   General appearance: alert, well appearing, and in no distress  Mental status: alert, oriented to person, place, and time  Skin: warm & dry   Cardiovascular: normal heart rate noted  Respiratory: normal respiratory effort, no distress  Abdomen: soft, non-tender   Pelvic: VULVA: normal appearing vulva with no masses, tenderness or lesions, VAGINA: normal appearing vagina with normal color and discharge, no lesions, CERVIX: normal appearing cervix without discharge or lesions  Extremities: no edema   Chaperone: Federico Flake    Results for orders placed or performed in visit on 07/30/22 (from the past 24 hour(s))  POC Urinalysis Dipstick OB   Collection Time: 07/30/22  4:39 PM  Result Value Ref Range   Color, UA     Clarity, UA     Glucose, UA Negative Negative   Bilirubin, UA     Ketones, UA negative  Spec Grav, UA     Blood, UA negative    pH, UA     POC,PROTEIN,UA Negative Negative, Trace, Small (1+), Moderate (2+), Large (3+), 4+   Urobilinogen, UA     Nitrite, UA negative    Leukocytes, UA Negative Negative   Appearance     Odor      Assessment & Plan:  1) Vaginal d/c w/ vulvar itching/irritation, pelvic pressure and cramping> CV swab sent  Meds: No orders of the defined types were placed in this encounter.   Orders Placed This Encounter  Procedures   POC Urinalysis Dipstick OB    Return in about 1 year (around 07/30/2023) for Physical.  Cheral Marker CNM, WHNP-BC 07/30/2022 4:40 PM

## 2022-08-01 LAB — CERVICOVAGINAL ANCILLARY ONLY
Bacterial Vaginitis (gardnerella): NEGATIVE
Candida Glabrata: NEGATIVE
Candida Vaginitis: NEGATIVE
Chlamydia: NEGATIVE
Comment: NEGATIVE
Comment: NEGATIVE
Comment: NEGATIVE
Comment: NEGATIVE
Comment: NEGATIVE
Comment: NORMAL
Neisseria Gonorrhea: NEGATIVE
Trichomonas: NEGATIVE

## 2022-08-06 ENCOUNTER — Encounter: Payer: Self-pay | Admitting: Women's Health

## 2022-08-09 ENCOUNTER — Encounter: Payer: Self-pay | Admitting: Psychiatry

## 2022-08-17 ENCOUNTER — Ambulatory Visit: Payer: BC Managed Care – PPO | Admitting: Family Medicine

## 2022-08-17 ENCOUNTER — Encounter: Payer: Self-pay | Admitting: Family Medicine

## 2022-08-17 VITALS — BP 123/87 | HR 87 | Ht 65.2 in | Wt 239.1 lb

## 2022-08-17 DIAGNOSIS — F419 Anxiety disorder, unspecified: Secondary | ICD-10-CM | POA: Diagnosis not present

## 2022-08-17 DIAGNOSIS — Z1329 Encounter for screening for other suspected endocrine disorder: Secondary | ICD-10-CM

## 2022-08-17 DIAGNOSIS — Z0001 Encounter for general adult medical examination with abnormal findings: Secondary | ICD-10-CM

## 2022-08-17 DIAGNOSIS — E559 Vitamin D deficiency, unspecified: Secondary | ICD-10-CM | POA: Diagnosis not present

## 2022-08-17 DIAGNOSIS — E538 Deficiency of other specified B group vitamins: Secondary | ICD-10-CM

## 2022-08-17 DIAGNOSIS — Z131 Encounter for screening for diabetes mellitus: Secondary | ICD-10-CM

## 2022-08-17 DIAGNOSIS — Z13220 Encounter for screening for lipoid disorders: Secondary | ICD-10-CM

## 2022-08-17 NOTE — Patient Instructions (Signed)

## 2022-08-17 NOTE — Progress Notes (Signed)
   New Patient Office Visit   Subjective   Patient ID: EVERLEAN KUTSCHER, female    DOB: 1990/04/14  Age: 32 y.o. MRN: [id number]  CC: No chief complaint on file.   HPI TEQUISHA MCMONAGLE 32 year old female,presents to establish care. She  has a past medical history of Anxiety, Asthma, Bipolar disorder (HCC), Depression, Heart murmur, Lipoma of back, MDD (major depressive disorder), PMDD (premenstrual dysphoric disorder), Pregnancy induced hypertension, and Severe preeclampsia, postpartum condition (05/09/2021).  HPI    Outpatient Encounter Medications as of 08/17/2022  Medication Sig   Magnesium 250 MG TABS    Prenatal Vit-Fe Fumarate-FA (PRENATAL MULTIVITAMIN) TABS tablet Take 1 tablet by mouth daily at 12 noon.   VITAMIN D, CHOLECALCIFEROL, PO Take by mouth.   No facility-administered encounter medications on file as of 08/17/2022.    Past Surgical History:  Procedure Laterality Date   CESAREAN SECTION N/A 05/10/2021   Procedure: CESAREAN SECTION;  Surgeon: Kathrynn Running, MD;  Location: [facility];  Service: Obstetrics;  Laterality: N/A;   LIPOMA EXCISION N/A 01/01/2020   Procedure: EXCISION of LIPOMA from BACK;  Surgeon: Franky Macho, MD;  Location: [facility];  Service: General;  Laterality: N/A;    ROS    Objective    LMP 06/07/2022   Physical Exam    Assessment & Plan:  Vitamin D deficiency  Screening for thyroid disorder  Screening for lipid disorders  Screening for diabetes mellitus    Return if symptoms worsen or fail to improve.   Cruzita Lederer Newman Nip, FNP

## 2022-08-17 NOTE — Progress Notes (Signed)
Complete physical exam  Patient: Megan Houston   DOB: 04-07-1990   32 y.o. Female  MRN: [id number]  Subjective:    Chief Complaint  Patient presents with   New Patient (Initial Visit)    Establishing care today, would like discuss adhd screening.     Megan Houston is a 32 y.o. female who presents today for a complete physical exam. She reports consuming a general diet. Home exercise routine includes calisthenics. She generally feels well. She reports sleeping okay. She does not have additional problems to discuss today.    Most recent fall risk assessment:    08/17/2022    3:11 PM  Fall Risk   Falls in the past year? 0  Number falls in past yr: 0  Injury with Fall? 0  Risk for fall due to : No Fall Risks  Follow up Falls evaluation completed     Most recent depression screenings:    08/17/2022    3:11 PM 08/17/2022    3:00 PM  PHQ 2/9 Scores  PHQ - 2 Score 0 0  PHQ- 9 Score 3 0    Vision:Not within last year  and Dental: No current dental problems and Receives regular dental care  Patient Care Team: Del Newman Nip, Tenna Child, FNP as PCP - General (Family Medicine)   Outpatient Medications Prior to Visit  Medication Sig   Magnesium 250 MG TABS    Prenatal Vit-Fe Fumarate-FA (PRENATAL MULTIVITAMIN) TABS tablet Take 1 tablet by mouth daily at 12 noon.   VITAMIN D, CHOLECALCIFEROL, PO Take by mouth.   No facility-administered medications prior to visit.    Review of Systems  Constitutional:  Negative for chills and fever.  HENT:  Negative for tinnitus.   Eyes:  Negative for blurred vision.  Respiratory:  Negative for shortness of breath.   Cardiovascular:  Negative for chest pain.  Gastrointestinal:  Negative for abdominal pain, nausea and vomiting.  Genitourinary:  Negative for dysuria.  Musculoskeletal:  Negative for myalgias.  Skin:  Negative for itching and rash.  Neurological:  Negative for dizziness and headaches.  Psychiatric/Behavioral:  The  patient is nervous/anxious.        Objective:    BP 123/87   Pulse 87   Ht 5' 5.2" (1.656 m)   Wt 239 lb 1.9 oz (108.5 kg)   LMP 06/07/2022   SpO2 97%   Breastfeeding Yes   BMI 39.55 kg/m  BP Readings from Last 3 Encounters:  08/17/22 123/87  07/30/22 137/84  07/06/22 116/81      Physical Exam Vitals reviewed.  Constitutional:      General: She is not in acute distress.    Appearance: Normal appearance. She is not ill-appearing, toxic-appearing or diaphoretic.  HENT:     Head: Normocephalic.  Eyes:     General:        Right eye: No discharge.        Left eye: No discharge.     Conjunctiva/sclera: Conjunctivae normal.     Pupils: Pupils are equal, round, and reactive to light.  Cardiovascular:     Rate and Rhythm: Normal rate.     Pulses: Normal pulses.     Heart sounds: Normal heart sounds.  Pulmonary:     Effort: Pulmonary effort is normal. No respiratory distress.     Breath sounds: Normal breath sounds.  Abdominal:     General: Bowel sounds are normal.     Palpations: Abdomen is soft.  Tenderness: There is no abdominal tenderness. There is no right CVA tenderness, left CVA tenderness or guarding.  Musculoskeletal:        General: Normal range of motion.     Cervical back: Normal range of motion.  Skin:    General: Skin is warm and dry.     Capillary Refill: Capillary refill takes less than 2 seconds.  Neurological:     General: No focal deficit present.     Mental Status: She is alert and oriented to person, place, and time.     Coordination: Coordination normal.     Gait: Gait normal.  Psychiatric:        Mood and Affect: Mood normal.        Behavior: Behavior normal.      No results found for any visits on 08/17/22.    Assessment & Plan:    Routine Health Maintenance and Physical Exam  Immunization History  Administered Date(s) Administered   Influenza,inj,Quad PF,6+ Mos 11/15/2020   MMR 05/12/2021   Tdap 03/22/2021    Health  Maintenance  Topic Date Due   COVID-19 Vaccine (1) Never done   INFLUENZA VACCINE  09/20/2022   PAP SMEAR-Modifier  07/05/2025   DTaP/Tdap/Td (2 - Td or Tdap) 03/23/2031   Hepatitis C Screening  Completed   HIV Screening  Completed   HPV VACCINES  Aged Out    Discussed health benefits of physical activity, and encouraged her to engage in regular exercise appropriate for her age and condition.  Vitamin D deficiency -     VITAMIN D 25 Hydroxy (Vit-D Deficiency, Fractures)  Screening for thyroid disorder -     TSH + free T4  Screening for lipid disorders -     Lipid panel -     CMP14+EGFR -     CBC with Differential/Platelet  Screening for diabetes mellitus -     Hemoglobin A1c  Vitamin B12 deficiency -     Vitamin B12  Anxiety Assessment & Plan:    08/17/2022    3:11 PM 08/17/2022    3:00 PM 07/06/2022   11:41 AM 03/08/2021    8:46 AM  GAD 7 : Generalized Anxiety Score  Nervous, Anxious, on Edge 1 0 0 0  Control/stop worrying 1 0 0 0  Worry too much - different things 1 0 0 0  Trouble relaxing 1 0 0 0  Restless 1 0 0 0  Easily annoyed or irritable 1 0 0 0  Afraid - awful might happen 1 0 0 0  Total GAD 7 Score 7 0 0 0  Anxiety Difficulty Somewhat difficult Not difficult at all    Continued discussion on lifestyle changes, establishing a daily routine, going outdoors, exercise, healthy eating habits, mindfulness and mediatation.    Encounter for routine adult physical exam with abnormal findings Assessment & Plan: Physical exam done, labs ordered  Updated screening and health maintenance  Exercise and nutrition counseling BMI  39.55 Discussed lifestyle modifications follow diet low in saturated fat, reduce dietary salt intake, avoid fatty foods, maintain an exercise routine 3 to 5 days a week for a minimum total of 150 minutes.      Return in 1 year (on 08/17/2023), or if symptoms worsen or fail to improve, for Annual Physical.     Cruzita Lederer Newman Nip,  FNP

## 2022-08-17 NOTE — Assessment & Plan Note (Signed)
Physical exam done, labs ordered  Updated screening and health maintenance  Exercise and nutrition counseling BMI  39.55 Discussed lifestyle modifications follow diet low in saturated fat, reduce dietary salt intake, avoid fatty foods, maintain an exercise routine 3 to 5 days a week for a minimum total of 150 minutes.

## 2022-08-17 NOTE — Assessment & Plan Note (Signed)
    08/17/2022    3:11 PM 08/17/2022    3:00 PM 07/06/2022   11:41 AM 03/08/2021    8:46 AM  GAD 7 : Generalized Anxiety Score  Nervous, Anxious, on Edge 1 0 0 0  Control/stop worrying 1 0 0 0  Worry too much - different things 1 0 0 0  Trouble relaxing 1 0 0 0  Restless 1 0 0 0  Easily annoyed or irritable 1 0 0 0  Afraid - awful might happen 1 0 0 0  Total GAD 7 Score 7 0 0 0  Anxiety Difficulty Somewhat difficult Not difficult at all    Continued discussion on lifestyle changes, establishing a daily routine, going outdoors, exercise, healthy eating habits, mindfulness and mediatation.

## 2022-08-20 DIAGNOSIS — E559 Vitamin D deficiency, unspecified: Secondary | ICD-10-CM | POA: Diagnosis not present

## 2022-08-20 DIAGNOSIS — Z13220 Encounter for screening for lipoid disorders: Secondary | ICD-10-CM | POA: Diagnosis not present

## 2022-08-20 DIAGNOSIS — Z1329 Encounter for screening for other suspected endocrine disorder: Secondary | ICD-10-CM | POA: Diagnosis not present

## 2022-08-20 DIAGNOSIS — Z131 Encounter for screening for diabetes mellitus: Secondary | ICD-10-CM | POA: Diagnosis not present

## 2022-08-20 DIAGNOSIS — E538 Deficiency of other specified B group vitamins: Secondary | ICD-10-CM | POA: Diagnosis not present

## 2022-08-21 LAB — CBC WITH DIFFERENTIAL/PLATELET
Basophils Absolute: 0 10*3/uL (ref 0.0–0.2)
Basos: 0 %
EOS (ABSOLUTE): 0.1 10*3/uL (ref 0.0–0.4)
Eos: 1 %
Hematocrit: 44.5 % (ref 34.0–46.6)
Hemoglobin: 14.3 g/dL (ref 11.1–15.9)
Immature Grans (Abs): 0 10*3/uL (ref 0.0–0.1)
Immature Granulocytes: 0 %
Lymphocytes Absolute: 2.3 10*3/uL (ref 0.7–3.1)
Lymphs: 22 %
MCH: 27.7 pg (ref 26.6–33.0)
MCHC: 32.1 g/dL (ref 31.5–35.7)
MCV: 86 fL (ref 79–97)
Monocytes Absolute: 0.8 10*3/uL (ref 0.1–0.9)
Monocytes: 7 %
Neutrophils Absolute: 7.1 10*3/uL — ABNORMAL HIGH (ref 1.4–7.0)
Neutrophils: 70 %
Platelets: 312 10*3/uL (ref 150–450)
RBC: 5.17 x10E6/uL (ref 3.77–5.28)
RDW: 13.2 % (ref 11.7–15.4)
WBC: 10.3 10*3/uL (ref 3.4–10.8)

## 2022-08-21 LAB — LIPID PANEL
Chol/HDL Ratio: 3.9 ratio (ref 0.0–4.4)
Cholesterol, Total: 162 mg/dL (ref 100–199)
HDL: 42 mg/dL (ref >39)
LDL Chol Calc (NIH): 98 mg/dL (ref 0–99)
Triglycerides: 123 mg/dL (ref 0–149)
VLDL Cholesterol Cal: 22 mg/dL (ref 5–40)

## 2022-08-21 LAB — CMP14+EGFR
ALT: 16 IU/L (ref 0–32)
AST: 17 IU/L (ref 0–40)
Albumin: 4.2 g/dL (ref 3.9–4.9)
Alkaline Phosphatase: 127 IU/L — ABNORMAL HIGH (ref 44–121)
BUN/Creatinine Ratio: 26 — ABNORMAL HIGH (ref 9–23)
BUN: 18 mg/dL (ref 6–20)
Bilirubin Total: 0.5 mg/dL (ref 0.0–1.2)
CO2: 23 mmol/L (ref 20–29)
Calcium: 9.2 mg/dL (ref 8.7–10.2)
Chloride: 104 mmol/L (ref 96–106)
Creatinine, Ser: 0.69 mg/dL (ref 0.57–1.00)
Globulin, Total: 2.2 g/dL (ref 1.5–4.5)
Glucose: 85 mg/dL (ref 70–99)
Potassium: 4.3 mmol/L (ref 3.5–5.2)
Sodium: 140 mmol/L (ref 134–144)
Total Protein: 6.4 g/dL (ref 6.0–8.5)
eGFR: 117 mL/min/{1.73_m2} (ref >59)

## 2022-08-21 LAB — VITAMIN B12: Vitamin B-12: 755 pg/mL (ref 232–1245)

## 2022-08-21 LAB — HEMOGLOBIN A1C
Est. average glucose Bld gHb Est-mCnc: 117 mg/dL
Hgb A1c MFr Bld: 5.7 % — ABNORMAL HIGH (ref 4.8–5.6)

## 2022-08-21 LAB — VITAMIN D 25 HYDROXY (VIT D DEFICIENCY, FRACTURES): Vit D, 25-Hydroxy: 60.6 ng/mL (ref 30.0–100.0)

## 2022-08-21 LAB — TSH+FREE T4
Free T4: 0.99 ng/dL (ref 0.82–1.77)
TSH: 3.18 u[IU]/mL (ref 0.450–4.500)

## 2022-08-21 IMAGING — DX DG FOOT COMPLETE 3+V*R*
3 series · 3 of 3 positions shown · non-contrast
Comparison: None.

CLINICAL DATA: Fall, lateral foot pain, initial encounter.

EXAM:
RIGHT FOOT COMPLETE - 3+ VIEW

[foot ap]
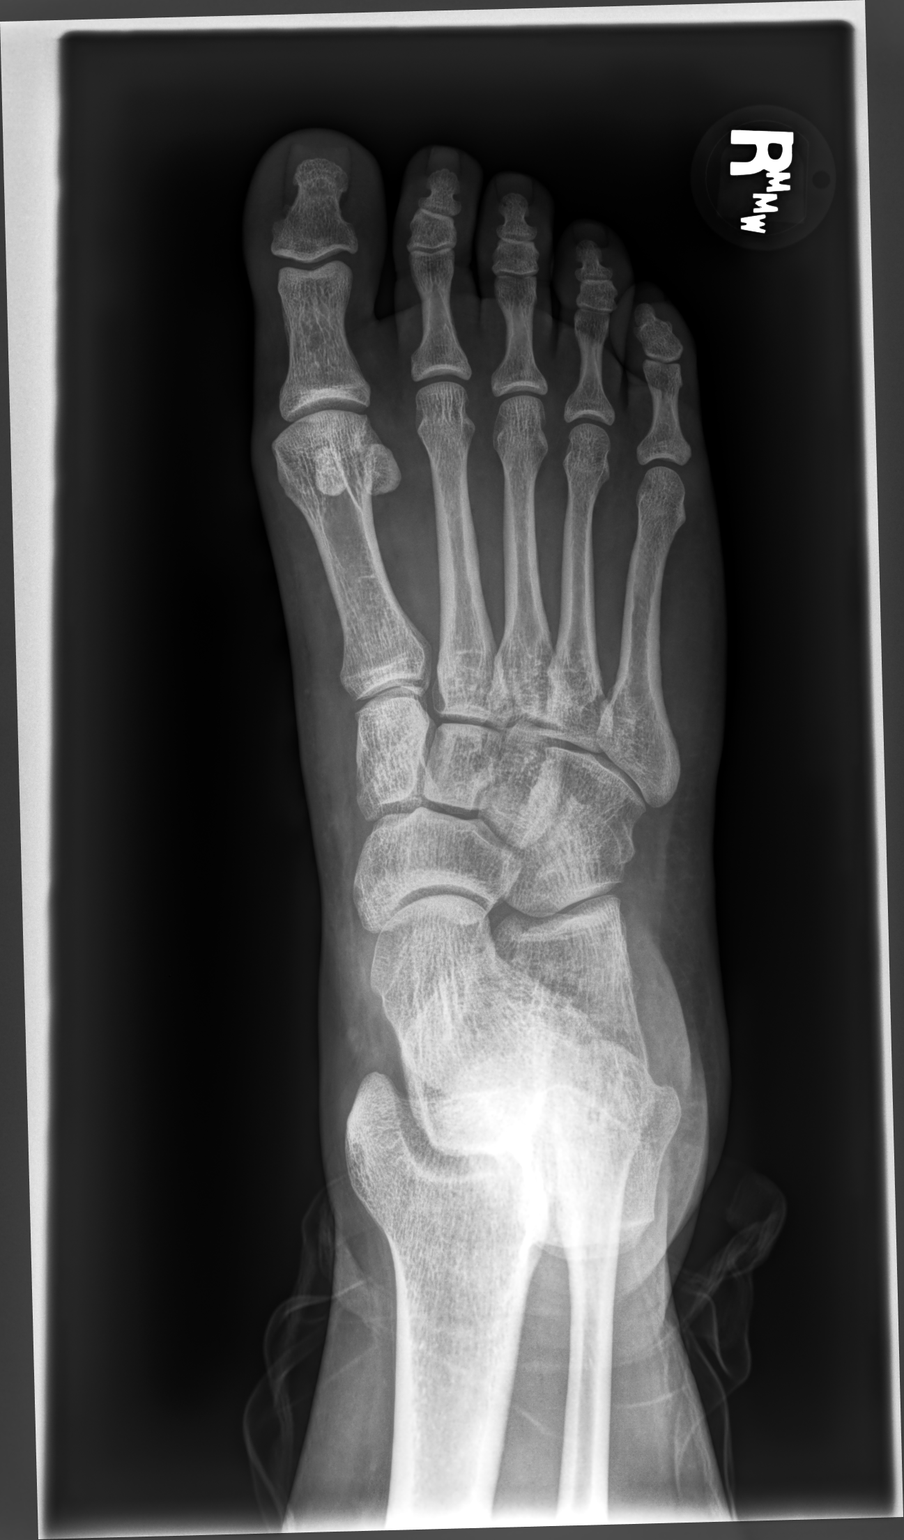

[foot mlo]
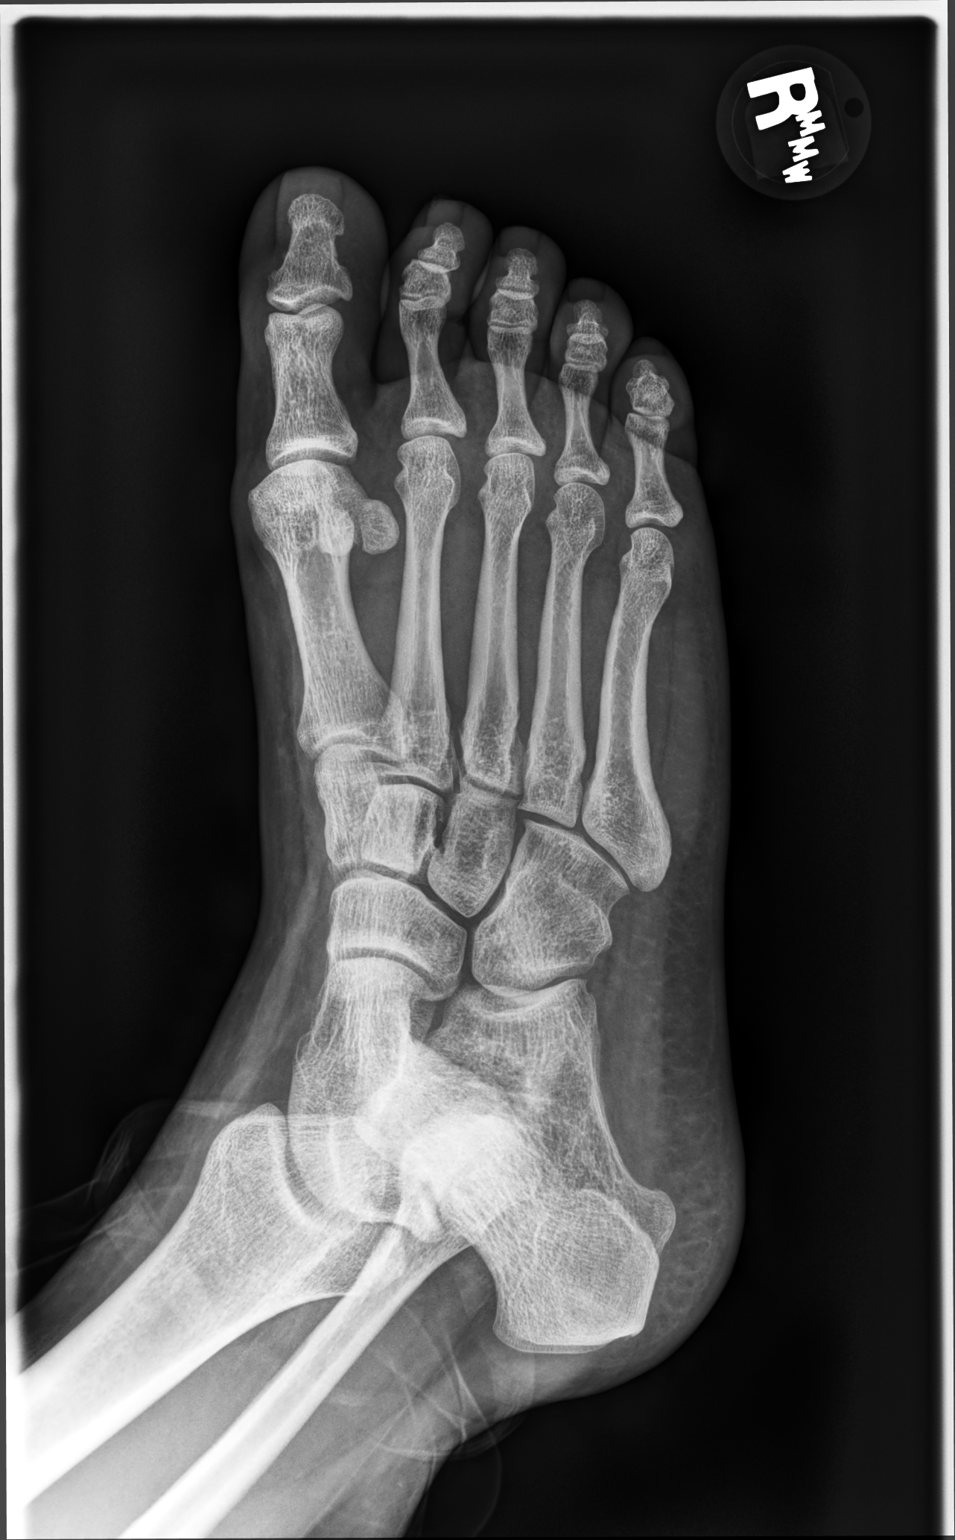

[foot lat]
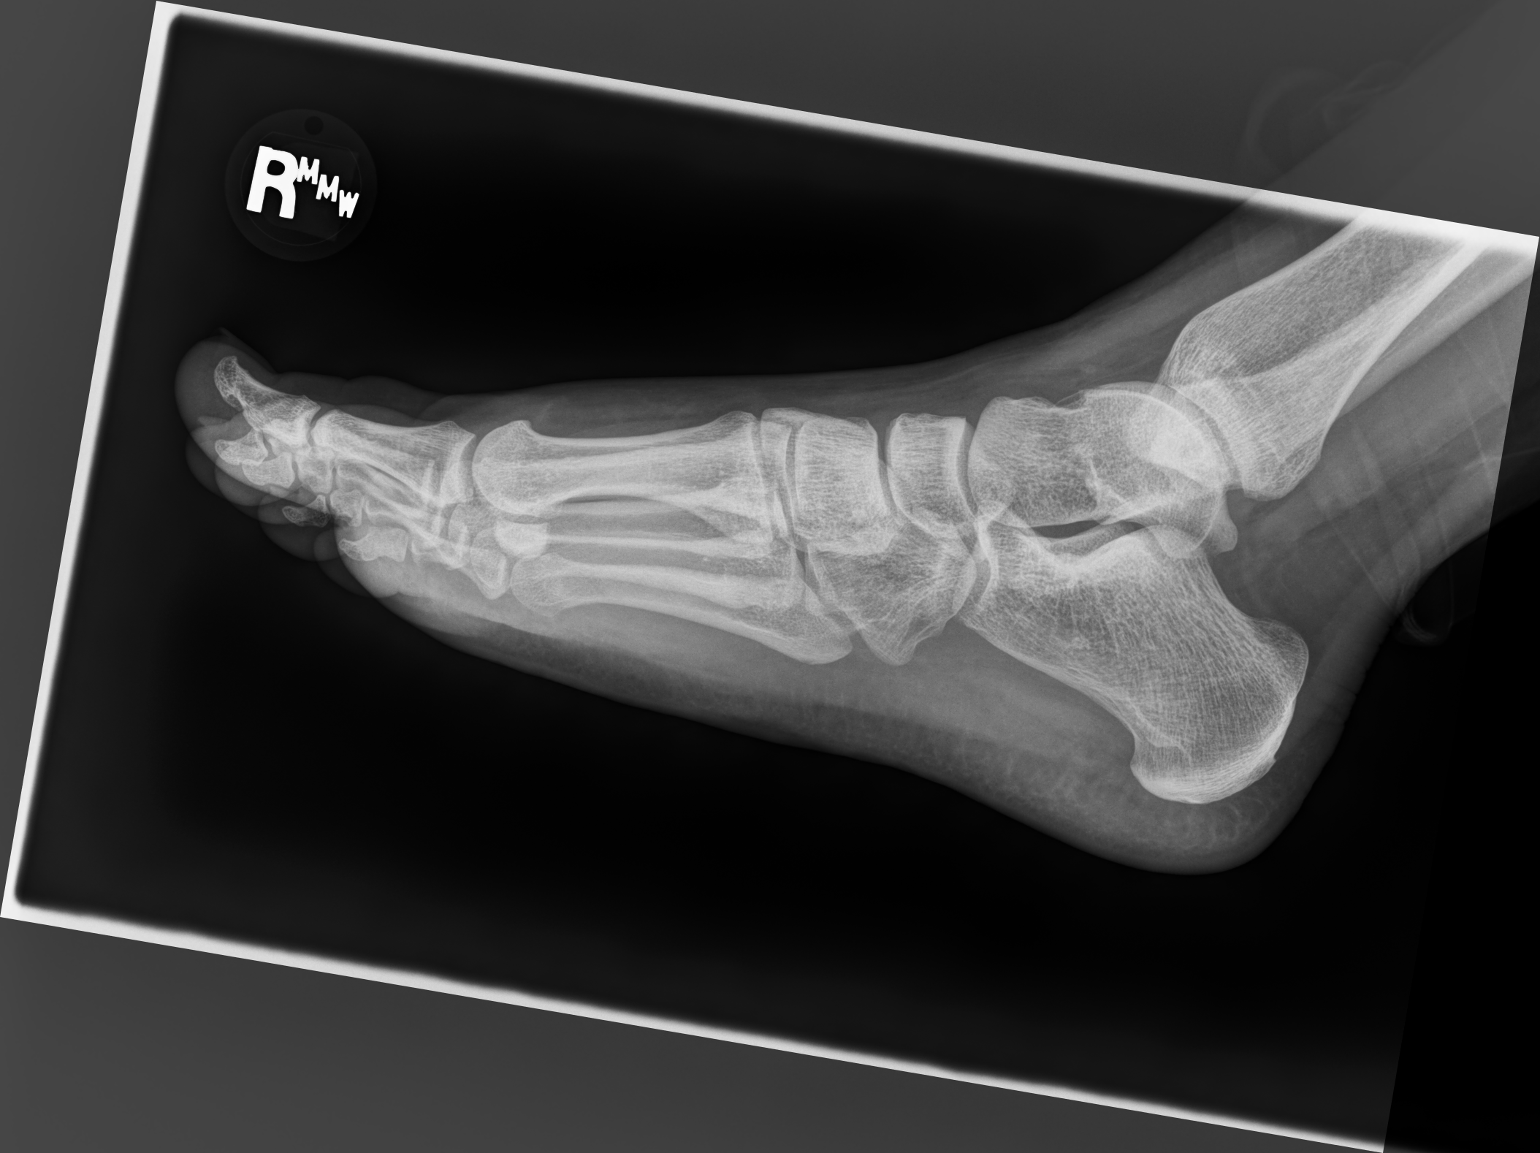

[3 of 3 positions shown; findings below may reference images not displayed]

FINDINGS: No acute osseous or joint abnormality.
IMPRESSION: No acute osseous or joint abnormality.

## 2022-08-24 ENCOUNTER — Other Ambulatory Visit: Payer: Self-pay

## 2022-08-24 ENCOUNTER — Ambulatory Visit
Admission: RE | Admit: 2022-08-24 | Discharge: 2022-08-24 | Disposition: A | Discharge status: Home / Self Care | Payer: BC Managed Care – PPO | Source: Ambulatory Visit | Attending: Nurse Practitioner | Admitting: Nurse Practitioner

## 2022-08-24 VITALS — BP 131/84 | HR 81 | Temp 98.0°F | Resp 20

## 2022-08-24 DIAGNOSIS — J019 Acute sinusitis, unspecified: Secondary | ICD-10-CM

## 2022-08-24 MED ORDER — FLUTICASONE PROPIONATE 50 MCG/ACT NA SUSP: 2.0000 | Freq: Every day | NASAL | 0 refills | Status: DC

## 2022-08-24 MED ORDER — AMOXICILLIN 875 MG PO TABS: 875.0000 mg | ORAL_TABLET | Freq: Two times a day (BID) | ORAL | 0 refills | Status: AC

## 2022-08-24 MED ORDER — GUAIFENESIN 100 MG/5ML PO LIQD: 100.0000 mg | ORAL | 0 refills | Status: DC | PRN

## 2022-08-24 NOTE — Discharge Instructions (Addendum)
Take medication as directed. Increase fluids and get plenty of rest. May take over-the-counter ibuprofen or Tylenol as needed for pain, fever, or general discomfort. Warm salt water gargles 3-4 times daily while symptoms persist. Recommend normal saline nasal spray to help with nasal congestion throughout the day. For your cough, it may be helpful to use a humidifier at bedtime during sleep. If symptoms do not improve with this treatment, please follow-up in this clinic or with your primary care physician for further evaluation. Follow-up as needed.

## 2022-08-24 NOTE — ED Triage Notes (Signed)
Nasal congestion, cough x1 week. Pt reports symptoms are now "in my chest" and "my voice is going in and out". Denies any known fever.

## 2022-08-24 NOTE — ED Provider Notes (Signed)
[facility]    CSN: [id number] Arrival date & time: 08/24/22  1356      History   Chief Complaint Chief Complaint  Patient presents with   Nasal Congestion    Have been sick for over a week and now my chest feels very heavy need to make sure it's not turning into something worse - Entered by patient    HPI Megan Houston is a 32 y.o. female.   The history is provided by the patient.   Patient presents with a more than 1 week history of nasal congestion, cough, chest tightness, and hoarseness.  Patient denies fever, chills, ear pain, ear drainage, wheezing, shortness of breath, difficulty breathing, chest pain, abdominal pain, nausea, vomiting, or diarrhea.  Patient reports she has been taking ibuprofen for her symptoms and that she is breast-feeding.  Patient states that her toddler was sick with the same or similar symptoms before she became sick.  Past Medical History:  Diagnosis Date   Anxiety    Asthma    Bipolar disorder (HCC)    Depression    Heart murmur    Lipoma of back    MDD (major depressive disorder)    PMDD (premenstrual dysphoric disorder)    Pregnancy induced hypertension    Severe preeclampsia, postpartum condition 05/09/2021    Patient Active Problem List   Diagnosis Date Noted   Encounter for routine adult physical exam with abnormal findings 08/17/2022   S/P emergency cesarean section 05/11/2021   Severe preeclampsia, postpartum condition 05/09/2021   Rubella non-immune status, antepartum 11/17/2020   Anxiety 12/15/2018   PMDD (premenstrual dysphoric disorder) 09/16/2018    Past Surgical History:  Procedure Laterality Date   CESAREAN SECTION N/A 05/10/2021   Procedure: CESAREAN SECTION;  Surgeon: Kathrynn Running, MD;  Location: [facility];  Service: Obstetrics;  Laterality: N/A;   LIPOMA EXCISION N/A 01/01/2020   Procedure: EXCISION of LIPOMA from BACK;  Surgeon: Franky Macho, MD;  Location: [facility];  Service: General;  Laterality:  N/A;    OB History     Gravida  1   Para  1   Term  1   Preterm  0   AB  0   Living  1      SAB  0   IAB  0   Ectopic  0   Multiple  0   Live Births  1            Home Medications    Prior to Admission medications   Medication Sig Start Date End Date Taking? Authorizing Provider  amoxicillin (AMOXIL) 875 MG tablet Take 1 tablet (875 mg total) by mouth 2 (two) times daily for 5 days. 08/24/22 08/29/22 Yes Halena Mohar-Warren, Sadie Haber, NP  fluticasone (FLONASE) 50 MCG/ACT nasal spray Place 2 sprays into both nostrils daily. 08/24/22  Yes Wilkin Lippy-Warren, Sadie Haber, NP  guaiFENesin (ROBITUSSIN) 100 MG/5ML liquid Take 5-10 mLs (100-200 mg total) by mouth every 4 (four) hours as needed for cough or to loosen phlegm. 08/24/22  Yes Mattingly Fountaine-Warren, Sadie Haber, NP  Magnesium 250 MG TABS  12/20/20   [provider]  Prenatal Vit-Fe Fumarate-FA (PRENATAL MULTIVITAMIN) TABS tablet Take 1 tablet by mouth daily at 12 noon.    [provider]  VITAMIN D, CHOLECALCIFEROL, PO Take by mouth.    [provider]    Family History Family History  Problem Relation Age of Onset   Liver disease Paternal Grandmother  Diabetes Maternal Grandmother    Bipolar disorder Maternal Grandmother    Anxiety disorder Maternal Grandmother    Schizophrenia Maternal Grandmother    OCD Father    Anxiety disorder Father    Diabetes Father    Hypertension Father    Diabetes Mother    Hypertension Mother    Bipolar disorder Mother    Alcohol abuse Mother    Drug abuse Mother    Cancer Mother        cervical   Bipolar disorder Sister    Drug abuse Sister    Alcohol abuse Sister    Hypertension Paternal Aunt    Diabetes Paternal Aunt    Hypertension Other    Diabetes Other     Social History Social History   Tobacco Use   Smoking status: Former    Packs/day: 0.25    Years: 4.00    Additional pack years: 0.00    Total pack years: 1.00    Types: Cigarettes    Quit  date: 05/27/2014    Years since quitting: 8.2   Smokeless tobacco: Never  Vaping Use   Vaping Use: Never used  Substance Use Topics   Alcohol use: Not Currently   Drug use: No     Allergies   Codeine and Hydrocodone-acetaminophen   Review of Systems Review of Systems Per HPI  Physical Exam Triage Vital Signs ED Triage Vitals  Enc Vitals Group     BP 08/24/22 1406 131/84     Pulse Rate 08/24/22 1406 81     Resp 08/24/22 1406 20     Temp 08/24/22 1406 98 F (36.7 C)     Temp Source 08/24/22 1406 Oral     SpO2 08/24/22 1406 94 %     Weight --      Height --      Head Circumference --      Peak Flow --      Pain Score 08/24/22 1404 0     Pain Loc --      Pain Edu? --      Excl. in GC? --    No data found.  Updated Vital Signs BP 131/84 (BP Location: Right Arm)   Pulse 81   Temp 98 F (36.7 C) (Oral)   Resp 20   LMP 08/12/2022 (Approximate)   SpO2 94%   Breastfeeding Yes   Visual Acuity Right Eye Distance:   Left Eye Distance:   Bilateral Distance:    Right Eye Near:   Left Eye Near:    Bilateral Near:     Physical Exam Vitals and nursing note reviewed.  Constitutional:      General: She is not in acute distress.    Appearance: Normal appearance.  HENT:     Head: Normocephalic.     Right Ear: Tympanic membrane, ear canal and external ear normal.     Left Ear: Tympanic membrane, ear canal and external ear normal.     Nose: Nose normal.     Right Turbinates: Enlarged and swollen.     Left Turbinates: Enlarged and swollen.     Right Sinus: No maxillary sinus tenderness or frontal sinus tenderness.     Left Sinus: No maxillary sinus tenderness or frontal sinus tenderness.     Mouth/Throat:     Lips: Pink.     Mouth: Mucous membranes are moist.     Pharynx: Oropharynx is clear. Uvula midline. Posterior oropharyngeal erythema present. No pharyngeal swelling.  Comments: Cobblestoning present to posterior oropharynx Eyes:     Extraocular Movements:  Extraocular movements intact.     Conjunctiva/sclera: Conjunctivae normal.     Pupils: Pupils are equal, round, and reactive to light.  Cardiovascular:     Rate and Rhythm: Normal rate and regular rhythm.     Pulses: Normal pulses.     Heart sounds: Normal heart sounds.  Pulmonary:     Effort: Pulmonary effort is normal.     Breath sounds: Normal breath sounds.  Abdominal:     General: Bowel sounds are normal.     Palpations: Abdomen is soft.     Tenderness: There is no abdominal tenderness.  Musculoskeletal:     Cervical back: Normal range of motion.  Skin:    General: Skin is warm and dry.  Neurological:     General: No focal deficit present.     Mental Status: She is alert and oriented to person, place, and time.  Psychiatric:        Mood and Affect: Mood normal.        Behavior: Behavior normal.      UC Treatments / Results  Labs (all labs ordered are listed, but only abnormal results are displayed) Labs Reviewed - No data to display  EKG   Radiology No results found.  Procedures Procedures (including critical care time)  Medications Ordered in UC Medications - No data to display  Initial Impression / Assessment and Plan / UC Course  I have reviewed the triage vital signs and the nursing notes.  Pertinent labs & imaging results that were available during my care of the patient were reviewed by me and considered in my medical decision making (see chart for details).  The patient is well-appearing, she is in no acute distress, vital signs are stable.  Symptoms appear to be consistent with acute sinusitis.  Will treat with amoxicillin 875 mg twice daily for the next 5 days.  This medication is safe for breast-feeding.  For her cough, guaifenesin 100 mg was prescribed.  Patient was given supportive care recommendations to include increasing fluids, allowing for plenty of rest, use of over-the-counter Tylenol for pain or discomfort, and using a humidifier in her  bedroom at nighttime during sleep.  Discussed indications of when follow-up may be necessary.  Patient was in agreement with this plan of care and verbalizes understanding.  All questions were answered.  Patient stable for discharge.  Final Clinical Impressions(s) / UC Diagnoses   Final diagnoses:  Acute sinusitis, recurrence not specified, unspecified location     Discharge Instructions      Take medication as directed. Increase fluids and get plenty of rest. May take over-the-counter ibuprofen or Tylenol as needed for pain, fever, or general discomfort. Warm salt water gargles 3-4 times daily while symptoms persist. Recommend normal saline nasal spray to help with nasal congestion throughout the day. For your cough, it may be helpful to use a humidifier at bedtime during sleep. If symptoms do not improve with this treatment, please follow-up in this clinic or with your primary care physician for further evaluation. Follow-up as needed.     ED Prescriptions     Medication Sig Dispense Auth. Provider   amoxicillin (AMOXIL) 875 MG tablet Take 1 tablet (875 mg total) by mouth 2 (two) times daily for 5 days. 10 tablet Aljean Horiuchi-Warren, Sadie Haber, NP   guaiFENesin (ROBITUSSIN) 100 MG/5ML liquid Take 5-10 mLs (100-200 mg total) by mouth every 4 (four) hours as  needed for cough or to loosen phlegm. 60 mL Loveda Colaizzi-Warren, Sadie Haber, NP   fluticasone (FLONASE) 50 MCG/ACT nasal spray Place 2 sprays into both nostrils daily. 16 g Charlie Seda-Warren, Sadie Haber, NP      PDMP not reviewed this encounter.   Abran Cantor, NP 08/24/22 1426

## 2022-08-30 ENCOUNTER — Encounter: Payer: Self-pay | Admitting: Family Medicine

## 2022-10-04 ENCOUNTER — Telehealth: Payer: BC Managed Care – PPO | Admitting: Nurse Practitioner

## 2022-10-04 DIAGNOSIS — R3989 Other symptoms and signs involving the genitourinary system: Secondary | ICD-10-CM | POA: Diagnosis not present

## 2022-10-04 MED ORDER — CEPHALEXIN 500 MG PO CAPS: 500.0000 mg | ORAL_CAPSULE | Freq: Two times a day (BID) | ORAL | 0 refills | Status: AC

## 2022-10-04 NOTE — Progress Notes (Signed)
E-Visit for Urinary Problems  We are sorry that you are not feeling well.  Here is how we plan to help!  Based on what you shared with me it looks like you most likely have a simple urinary tract infection.  A UTI (Urinary Tract Infection) is a bacterial infection of the bladder.  Most cases of urinary tract infections are simple to treat but a key part of your care is to encourage you to drink plenty of fluids and watch your symptoms carefully.  I have prescribed Keflex 500 mg twice a day for 7 days.  Your symptoms should gradually improve. Call us if the burning in your urine worsens, you develop worsening fever, back pain or pelvic pain or if your symptoms do not resolve after completing the antibiotic.  Urinary tract infections can be prevented by drinking plenty of water to keep your body hydrated.  Also be sure when you wipe, wipe from front to back and don't hold it in!  If possible, empty your bladder every 4 hours.  HOME CARE Drink plenty of fluids Compete the full course of the antibiotics even if the symptoms resolve Remember, when you need to go.go. Holding in your urine can increase the likelihood of getting a UTI! GET HELP RIGHT AWAY IF: You cannot urinate You get a high fever Worsening back pain occurs You see blood in your urine You feel sick to your stomach or throw up You feel like you are going to pass out  MAKE SURE YOU  Understand these instructions. Will watch your condition. Will get help right away if you are not doing well or get worse.   Thank you for choosing an e-visit.  Your e-visit answers were reviewed by a board certified advanced clinical practitioner to complete your personal care plan. Depending upon the condition, your plan could have included both over the counter or prescription medications.  Please review your pharmacy choice. Make sure the pharmacy is open so you can pick up prescription now. If there is a problem, you may contact your  provider through MyChart messaging and have the prescription routed to another pharmacy.  Your safety is important to us. If you have drug allergies check your prescription carefully.   For the next 24 hours you can use MyChart to ask questions about today's visit, request a non-urgent call back, or ask for a work or school excuse. You will get an email in the next two days asking about your experience. I hope that your e-visit has been valuable and will speed your recovery.   Meds ordered this encounter  Medications   cephALEXin (KEFLEX) 500 MG capsule    Sig: Take 1 capsule (500 mg total) by mouth 2 (two) times daily for 7 days.    Dispense:  14 capsule    Refill:  0    I spent approximately 5 minutes reviewing the patient's history, current symptoms and coordinating their care today.   

## 2022-10-12 DIAGNOSIS — H04123 Dry eye syndrome of bilateral lacrimal glands: Secondary | ICD-10-CM | POA: Diagnosis not present

## 2022-10-12 DIAGNOSIS — H5310 Unspecified subjective visual disturbances: Secondary | ICD-10-CM | POA: Diagnosis not present

## 2022-10-12 DIAGNOSIS — Z135 Encounter for screening for eye and ear disorders: Secondary | ICD-10-CM | POA: Diagnosis not present

## 2022-10-12 DIAGNOSIS — H43393 Other vitreous opacities, bilateral: Secondary | ICD-10-CM | POA: Diagnosis not present

## 2022-11-07 ENCOUNTER — Ambulatory Visit: Payer: BC Managed Care – PPO | Admitting: Family Medicine

## 2022-11-07 VITALS — BP 135/90 | HR 87 | Ht 65.2 in | Wt 239.0 lb

## 2022-11-07 DIAGNOSIS — R011 Cardiac murmur, unspecified: Secondary | ICD-10-CM

## 2022-11-07 DIAGNOSIS — R002 Palpitations: Secondary | ICD-10-CM | POA: Insufficient documentation

## 2022-11-07 DIAGNOSIS — F419 Anxiety disorder, unspecified: Secondary | ICD-10-CM

## 2022-11-07 MED ORDER — SERTRALINE HCL 25 MG PO TABS: 25.0000 mg | ORAL_TABLET | Freq: Every day | ORAL | 2 refills | Status: DC

## 2022-11-07 NOTE — Assessment & Plan Note (Signed)
EKG ordered. Patient reports history of VSD Murmur heard on auscultation Referral placed to cardiology  Advise patient can be related to anxiety, Discussed to avoid or limit caffeine, alcohol, smoking, illegal drugs. Try relaxation techniques such as mediatation, yoga or deep breathing

## 2022-11-07 NOTE — Progress Notes (Signed)
Patient Office Visit   Subjective   Patient ID: Megan Houston, female    DOB: [birth year]  Age: 32 y.o. MRN: [id number]  CC:  Chief Complaint  Patient presents with   Palpitations    Frequently, worst happened last week. Experiencing dizziness, and sharp shooting chest pains. Possible anxiety    HPI Megan Houston 32 year old female, presents to the clinic for anxiety and worsening heart palpations.She  has a past medical history of Anxiety, Asthma, Bipolar disorder (HCC), Depression, Heart murmur, Lipoma of back, MDD (major depressive disorder), PMDD (premenstrual dysphoric disorder), Pregnancy induced hypertension, and Severe preeclampsia, postpartum condition (05/09/2021).  Palpitations  This is a recurrent problem. Heart palpitations occurs intermittently and has been rapidly worsening since onset. The symptoms are aggravated by stress. Associated symptoms include anxiety, chest pain, dizziness, malaise/fatigue, shortness of breath and weakness. She has tried nothing for the symptoms. Risk factors include stress. Her past medical history is significant for anxiety.   Anxiety Presents for initial visit. Anxiety has been gradually worsening. Symptoms include chest pain, dizziness, nervous/anxious behavior, palpitations and shortness of breath. Symptoms occur occasionally. The severity of symptoms is interfering with daily activities. The symptoms are aggravated by family issues. The patient sleeps 6 hours per night. Nighttime awakenings: several. Risk factors include family history and a major life event. Her past medical history is significant for anxiety/panic attacks.      Outpatient Encounter Medications as of 11/07/2022  Medication Sig   Magnesium 250 MG TABS    Prenatal Vit-Fe Fumarate-FA (PRENATAL MULTIVITAMIN) TABS tablet Take 1 tablet by mouth daily at 12 noon.   sertraline (ZOLOFT) 25 MG tablet Take 1 tablet (25 mg total) by mouth daily.   VITAMIN D, CHOLECALCIFEROL,  PO Take by mouth.   fluticasone (FLONASE) 50 MCG/ACT nasal spray Place 2 sprays into both nostrils daily. (Patient not taking: Reported on 11/07/2022)   guaiFENesin (ROBITUSSIN) 100 MG/5ML liquid Take 5-10 mLs (100-200 mg total) by mouth every 4 (four) hours as needed for cough or to loosen phlegm. (Patient not taking: Reported on 11/07/2022)   No facility-administered encounter medications on file as of 11/07/2022.    Past Surgical History:  Procedure Laterality Date   CESAREAN SECTION N/A 05/10/2021   Procedure: CESAREAN SECTION;  Surgeon: Kathrynn Running, MD;  Location: [facility];  Service: Obstetrics;  Laterality: N/A;   LIPOMA EXCISION N/A 01/01/2020   Procedure: EXCISION of LIPOMA from BACK;  Surgeon: Franky Macho, MD;  Location: [facility];  Service: General;  Laterality: N/A;    Review of Systems  Constitutional:  Negative for chills and fever.  Eyes:  Negative for blurred vision.  Respiratory:  Positive for shortness of breath.   Cardiovascular:  Positive for chest pain and palpitations.  Neurological:  Negative for dizziness and headaches.  Psychiatric/Behavioral:  Negative for suicidal ideas. The patient is nervous/anxious and has insomnia.       Objective    BP (!) 135/90 (BP Location: Right Arm, Patient Position: Sitting, Cuff Size: Normal)   Pulse 87   Ht 5' 5.2" (1.656 m)   Wt 239 lb (108.4 kg)   SpO2 98%   BMI 39.53 kg/m   Physical Exam Vitals reviewed.  Constitutional:      General: She is not in acute distress.    Appearance: Normal appearance. She is not ill-appearing, toxic-appearing or diaphoretic.  HENT:     Head: Normocephalic.  Eyes:     General:  Right eye: No discharge.        Left eye: No discharge.     Conjunctiva/sclera: Conjunctivae normal.  Cardiovascular:     Rate and Rhythm: Normal rate.     Pulses: Normal pulses.     Heart sounds: Murmur heard.  Pulmonary:     Effort: Pulmonary effort is normal. No respiratory distress.      Breath sounds: Normal breath sounds.  Abdominal:     Tenderness: There is no right CVA tenderness or left CVA tenderness.  Musculoskeletal:        General: Normal range of motion.     Cervical back: Normal range of motion.  Skin:    General: Skin is warm and dry.     Capillary Refill: Capillary refill takes less than 2 seconds.  Neurological:     General: No focal deficit present.     Mental Status: She is alert.     Coordination: Coordination normal.     Gait: Gait normal.  Psychiatric:        Mood and Affect: Mood normal.        Behavior: Behavior normal.       Assessment & Plan:  Murmur -     Ambulatory referral to Cardiology  Anxiety Assessment & Plan:    11/07/2022    2:57 PM 08/17/2022    3:11 PM 08/17/2022    3:00 PM 07/06/2022   11:41 AM  GAD 7 : Generalized Anxiety Score  Nervous, Anxious, on Edge 1 1 0 0  Control/stop worrying 1 1 0 0  Worry too much - different things 1 1 0 0  Trouble relaxing 1 1 0 0  Restless 0 1 0 0  Easily annoyed or irritable 1 1 0 0  Afraid - awful might happen 1 1 0 0  Total GAD 7 Score 6 7 0 0  Anxiety Difficulty Not difficult at all Somewhat difficult Not difficult at all    Started on Zoloft 25 mg once daily Follow up in 6 weeks Referral placed to behavior health services  Discussed about cognitive behavioral therapy focusing on thoughts, belief, and attitudes that affects feelings and behavior, learning about coping skills to deal with certain problems. Maintaining a consistent routine and schedule, Practice stress management and self calming techniques, excersise regularly and spend time outdoors, Do not eat food that are high in fat, added sugar, or salt.  Patient verbally consented to [facility] services about presenting concerns and psychiatric consultation as appropriate. The services will be billed as appropriate for the patient.    Orders: -     Ambulatory referral to Behavioral  Health  Palpitations Assessment & Plan: EKG ordered. Patient reports history of VSD Murmur heard on auscultation Referral placed to cardiology  Advise patient can be related to anxiety, Discussed to avoid or limit caffeine, alcohol, smoking, illegal drugs. Try relaxation techniques such as mediatation, yoga or deep breathing    Orders: -     EKG 12-Lead  Other orders -     Sertraline HCl; Take 1 tablet (25 mg total) by mouth daily.  Dispense: 30 tablet; Refill: 2    Return in about 6 weeks (around 12/19/2022), or if symptoms worsen or fail to improve, for Anxiety.   Cruzita Lederer Newman Nip, FNP

## 2022-11-07 NOTE — Assessment & Plan Note (Signed)
    11/07/2022    2:57 PM 08/17/2022    3:11 PM 08/17/2022    3:00 PM 07/06/2022   11:41 AM  GAD 7 : Generalized Anxiety Score  Nervous, Anxious, on Edge 1 1 0 0  Control/stop worrying 1 1 0 0  Worry too much - different things 1 1 0 0  Trouble relaxing 1 1 0 0  Restless 0 1 0 0  Easily annoyed or irritable 1 1 0 0  Afraid - awful might happen 1 1 0 0  Total GAD 7 Score 6 7 0 0  Anxiety Difficulty Not difficult at all Somewhat difficult Not difficult at all    Started on Zoloft 25 mg once daily Follow up in 6 weeks Referral placed to behavior health services  Discussed about cognitive behavioral therapy focusing on thoughts, belief, and attitudes that affects feelings and behavior, learning about coping skills to deal with certain problems. Maintaining a consistent routine and schedule, Practice stress management and self calming techniques, excersise regularly and spend time outdoors, Do not eat food that are high in fat, added sugar, or salt.  Patient verbally consented to [facility] services about presenting concerns and psychiatric consultation as appropriate. The services will be billed as appropriate for the patient.

## 2022-11-07 NOTE — Patient Instructions (Signed)
        Great to see you today.    - Please take medications as prescribed. - Follow up with your primary health provider if any health concerns arises. - If symptoms worsen please contact your primary care provider and/or visit the emergency department.

## 2022-11-09 ENCOUNTER — Ambulatory Visit: Payer: BC Managed Care – PPO

## 2022-11-12 ENCOUNTER — Ambulatory Visit: Payer: BC Managed Care – PPO | Admitting: Psychiatry

## 2022-11-12 ENCOUNTER — Encounter: Payer: Self-pay | Admitting: Psychiatry

## 2022-11-12 VITALS — BP 147/93 | HR 81 | Ht 65.0 in | Wt 238.0 lb

## 2022-11-12 DIAGNOSIS — M542 Cervicalgia: Secondary | ICD-10-CM

## 2022-11-12 DIAGNOSIS — G43109 Migraine with aura, not intractable, without status migrainosus: Secondary | ICD-10-CM

## 2022-11-12 NOTE — Patient Instructions (Signed)
You can try taking vitamin B2 (200 mg twice a day) in addition to magnesium to help with headache prevention

## 2022-11-12 NOTE — Progress Notes (Signed)
   CC:  headaches  Follow-up Visit  Last visit: 04/06/22  Brief HPI: 32 year old female with a history of anxiety who follows in clinic for migraines and facial numbness. Brain MRI and CTV head in February 2024 were unremarkable.  At her last visit she was recommended to start vitamin B2 200 mg BID and continue magnesium.  Interval History: Headaches have been well-controlled on magnesium. She forgot to try vitamin B2. She has had 1 migraine since her last visit which improved with OTCs. Migraines are associated with paresthesias in her scalp and left arm (states it "feels like getting goosebumps"). Reports significant neck pain and tension. She is still breastfeeding.  Migraine days per month: 1 Headache free days per month: 29  Current Headache Regimen: Preventative: magnesium Abortive: tylenol, ibuprofen   Prior Therapies                                  Tylenol Ibuprofen Topamax - paresthesias Magnesium (unsure of dose)  Physical Exam:   Vital Signs: BP (!) 147/93 (BP Location: Right Arm, Patient Position: Sitting, Cuff Size: Large)   Pulse 81   Ht 5\' 5"  (1.651 m)   Wt 238 lb (108 kg)   BMI 39.61 kg/m  GENERAL:  well appearing, in no acute distress, alert  SKIN:  Color, texture, turgor normal. No rashes or lesions HEAD:  Normocephalic/atraumatic. RESP: normal respiratory effort MSK:  +tenderness to palpation over L>R neck and shoulders   NEUROLOGICAL: Mental Status: Alert, oriented to person, place and time, Follows commands, and Speech fluent and appropriate. Cranial Nerves: PERRL, face symmetric, no dysarthria, hearing grossly intact Motor: moves all extremities equally Gait: normal-based.  IMPRESSION: 32 year old female with a history of anxiety who presents for follow up of migraines. MRI brain and CTV head were unremarkable, reviewed images with patients today. She forgot to start vitamin B2, counseled that adding this to magnesium may help prevent  migraines. Will refer to neck PT for cervicalgia.  PLAN: -Prevention: continue magnesium, add vitamin B2 200 mg BID -Rescue: ibuprofen, tylenol -Referral to neck PT   Follow-up: 6 months  I spent a total of 21 minutes on the date of the service. Headache education was done. Discussed treatment options including preventive and acute medications. Discussed medication side effects, adverse reactions and drug interactions. Written educational materials and patient instructions outlining all of the above were given.  Ocie Doyne, MD 11/12/22 2:38 PM

## 2022-11-16 ENCOUNTER — Encounter: Payer: Self-pay | Admitting: Family Medicine

## 2022-12-13 ENCOUNTER — Ambulatory Visit: Payer: BC Managed Care – PPO | Attending: Internal Medicine | Admitting: Internal Medicine

## 2022-12-13 ENCOUNTER — Other Ambulatory Visit: Payer: Self-pay | Admitting: Internal Medicine

## 2022-12-13 ENCOUNTER — Encounter: Payer: Self-pay | Admitting: Internal Medicine

## 2022-12-13 ENCOUNTER — Telehealth: Payer: Self-pay | Admitting: Internal Medicine

## 2022-12-13 ENCOUNTER — Ambulatory Visit: Payer: BC Managed Care – PPO | Attending: Internal Medicine

## 2022-12-13 VITALS — BP 124/80 | HR 90 | Ht 65.5 in | Wt 240.0 lb

## 2022-12-13 DIAGNOSIS — Z8249 Family history of ischemic heart disease and other diseases of the circulatory system: Secondary | ICD-10-CM | POA: Insufficient documentation

## 2022-12-13 DIAGNOSIS — R002 Palpitations: Secondary | ICD-10-CM

## 2022-12-13 NOTE — Patient Instructions (Addendum)
Medication Instructions:   Continue all current medications.  Labwork:  none  Testing/Procedures:  Your physician has requested that you have an echocardiogram. Echocardiography is a painless test that uses sound waves to create images of your heart. It provides your doctor with information about the size and shape of your heart and how well your heart's chambers and valves are working. This procedure takes approximately one hour. There are no restrictions for this procedure. Please do NOT wear cologne, perfume, aftershave, or lotions (deodorant is allowed). Please arrive 15 minutes prior to your appointment time. Your physician has recommended that you wear a 14 day event monitor. Event monitors are medical devices that record the heart's electrical activity. Doctors most often [place] these monitors to diagnose arrhythmias. Arrhythmias are problems with the speed or rhythm of the heartbeat. The monitor is a small, portable device. You can wear one while you do your normal daily activities. This is usually used to diagnose what is causing palpitations/syncope (passing out). Office will contact with results via phone, letter or mychart.     Follow-Up:  Pending test results    Any Other Special Instructions Will Be Listed Below (If Applicable).   If you need a refill on your cardiac medications before your next appointment, please call your pharmacy.

## 2022-12-13 NOTE — Progress Notes (Addendum)
Cardiology Office Note  Date: 12/13/2022   ID: Megan Houston, DOB Sep 23, 1990, MRN [id number]  PCP:  Rica Records, FNP  Cardiologist:  Marjo Bicker, MD Electrophysiologist:  None   History of Present Illness: Megan Houston is a 32 y.o. female known to have preeclampsia was referred to cardiology clinic for evaluation of palpitations.  Ongoing palpitations for the last 1 month, occurs almost daily and last for minutes.  SOB dizziness.  She had chest pain on 1 occasion, squeezing in quality, at rest, with SOB and dizziness.  It scared her.  Otherwise did not have any chest pains with exertion.  No syncope.  No leg swelling.  She has a family history of heart transplantation in her younger brother at 24 years of age.  Past Medical History:  Diagnosis Date   Anxiety    Asthma    Bipolar disorder (HCC)    Depression    Heart murmur    Lipoma of back    MDD (major depressive disorder)    PMDD (premenstrual dysphoric disorder)    Pregnancy induced hypertension    Severe preeclampsia, postpartum condition 05/09/2021    Past Surgical History:  Procedure Laterality Date   CESAREAN SECTION N/A 05/10/2021   Procedure: CESAREAN SECTION;  Surgeon: Kathrynn Running, MD;  Location: [facility];  Service: Obstetrics;  Laterality: N/A;   LIPOMA EXCISION N/A 01/01/2020   Procedure: EXCISION of LIPOMA from BACK;  Surgeon: Franky Macho, MD;  Location: [facility];  Service: General;  Laterality: N/A;    Current Outpatient Medications  Medication Sig Dispense Refill   b complex vitamins capsule Take 1 capsule by mouth at bedtime.     Cholecalciferol (VITAMIN D) 125 MCG (5000 UT) CAPS Take 1 capsule by mouth at bedtime.     Magnesium 250 MG TABS Take 1 tablet by mouth at bedtime.     Prenatal Vit-Fe Fumarate-FA (PRENATAL MULTIVITAMIN) TABS tablet Take 1 tablet by mouth daily at 12 noon.     sertraline (ZOLOFT) 25 MG tablet Take 1 tablet (25 mg total) by mouth daily.  (Patient not taking: Reported on 12/13/2022) 30 tablet 2   No current facility-administered medications for this visit.   Allergies:  Codeine and Hydrocodone-acetaminophen   Social History: The patient  reports that she quit smoking about 8 years ago. Her smoking use included cigarettes. She started smoking about 12 years ago. She has a 1 pack-year smoking history. She has never used smokeless tobacco. She reports that she does not currently use alcohol. She reports that she does not use drugs.   Family History: The patient's family history includes Alcohol abuse in her mother and sister; Anxiety disorder in her father and maternal grandmother; Bipolar disorder in her maternal grandmother, mother, and sister; Cancer in her mother; Diabetes in her father, maternal grandmother, mother, paternal aunt, and another family member; Drug abuse in her mother and sister; Hypertension in her father, mother, paternal aunt, and another family member; Leukemia in her mother; Liver disease in her paternal grandmother; OCD in her father; Schizophrenia in her maternal grandmother.   ROS:  Please see the history of present illness. Otherwise, complete review of systems is positive for none  All other systems are reviewed and negative.   Physical Exam: VS:  BP 124/80   Pulse 90   Ht 5' 5.5" (1.664 m)   Wt 240 lb (108.9 kg)   SpO2 98%   BMI 39.33 kg/m , BMI Body  mass index is 39.33 kg/m.  Wt Readings from Last 3 Encounters:  12/13/22 240 lb (108.9 kg)  11/12/22 238 lb (108 kg)  11/07/22 239 lb (108.4 kg)    General: Patient appears comfortable at rest. HEENT: Conjunctiva and lids normal, oropharynx clear with moist mucosa. Neck: Supple, no elevated JVP or carotid bruits, no thyromegaly. Lungs: Clear to auscultation, nonlabored breathing at rest. Cardiac: Regular rate and rhythm, no S3 or significant systolic murmur, no pericardial rub. Abdomen: Soft, nontender, no hepatomegaly, bowel sounds present, no  guarding or rebound. Extremities: No pitting edema, distal pulses 2+. Skin: Warm and dry. Musculoskeletal: No kyphosis. Neuropsychiatric: Alert and oriented x3, affect grossly appropriate.  Recent Labwork: 08/20/2022: ALT 16; AST 17; BUN 18; Creatinine, Ser 0.69; Hemoglobin 14.3; Platelets 312; Potassium 4.3; Sodium 140; TSH 3.180     Component Value Date/Time   CHOL 162 08/20/2022 0818   TRIG 123 08/20/2022 0818   HDL 42 08/20/2022 0818   CHOLHDL 3.9 08/20/2022 0818   LDLCALC 98 08/20/2022 0818     Assessment and Plan:  Palpitations Family history of heart transplantation in brother at 25 years   -Ongoing palpitations for the last 1 month, occurs almost daily and lasts for minutes.  Associated with dizziness. Chest pain occurred once with rest but no chest pain with exertion. Obtain 2-week event monitor. Her younger brother recently underwent heart transplantation at 34 years of age, will obtain 2D echocardiogram.   I spent a total duration of 40 minutes reviewing labs, EKG, face-to-face discussion/counseling of her medical condition, evaluation, management, ordering test and documenting the findings in the note.     Medication Adjustments/Labs and Tests Ordered: Current medicines are reviewed at length with the patient today.  Concerns regarding medicines are outlined above.    Disposition:  Follow up  pending results  Signed Kinta Martis Verne Spurr, MD, 12/13/2022 3:17 PM    [facility] [address] [place], [place], [place] [address]

## 2022-12-14 ENCOUNTER — Encounter: Payer: Self-pay | Admitting: Family Medicine

## 2022-12-14 ENCOUNTER — Encounter (INDEPENDENT_AMBULATORY_CARE_PROVIDER_SITE_OTHER): Payer: BC Managed Care – PPO | Admitting: Internal Medicine

## 2022-12-14 DIAGNOSIS — O1490 Unspecified pre-eclampsia, unspecified trimester: Secondary | ICD-10-CM

## 2022-12-14 DIAGNOSIS — Z3A00 Weeks of gestation of pregnancy not specified: Secondary | ICD-10-CM

## 2022-12-14 NOTE — Telephone Encounter (Signed)
Please see the MyChart message reply(ies) for my assessment and plan.    This patient gave consent for this Medical Advice Message and is aware that it may result in a bill to their insurance company, as well as the possibility of receiving a bill for a co-payment or deductible. They are an established patient, but are not seeking medical advice exclusively about a problem treated during an in person or video visit in the last seven days. I did not recommend an in person or video visit within seven days of my reply.    I spent a total of 7 minutes cumulative time within 7 days through MyChart messaging.  Kiva Norland P Lenee Franze, MD   

## 2022-12-14 NOTE — Telephone Encounter (Signed)
Checking percert on the following    14 day event monitor.

## 2022-12-18 NOTE — Progress Notes (Unsigned)
Virtual Visit via Video Note  I connected with Megan Houston on 12/19/22 at  2:00 PM EDT by a video enabled telemedicine application and verified that I am speaking with the correct person using two identifiers.  Patient Location: Home Provider Location: Office/Clinic  I discussed the limitations, risks, security, and privacy concerns of performing an evaluation and management service by video and the availability of in person appointments. I also discussed with the patient that there may be a patient responsible charge related to this service. The patient expressed understanding and agreed to proceed.  Subjective: PCP: Rica Records, FNP  Chief Complaint  Patient presents with   Follow-up    6 wk follow up. Stopped taking medication due to severe migraines with stiff neck and shoulder pain ,along w/ sexual dysfunction as well as sleep issues.    Anxiety  The patient presents for a follow-up visit via telehealth reporting intolerance to Zoloft 25 mg after two weeks due to side effects. Current symptoms include excessive worry, insomnia, nervousness, anxious behavior, and restlessness. Symptoms are constant and significantly interfere with daily activities, causing notable distress. Sleep quality remains poor, with several nighttime awakenings. Medication adherence has been moderate (26-50%), with side effects such as headaches and worsened insomnia noted. The patient denies any suicidal ideation.       ROS: Per HPI  Current Outpatient Medications:    b complex vitamins capsule, Take 1 capsule by mouth at bedtime., Disp: , Rfl:    Cholecalciferol (VITAMIN D) 125 MCG (5000 UT) CAPS, Take 1 capsule by mouth at bedtime., Disp: , Rfl:    Magnesium 250 MG TABS, Take 1 tablet by mouth at bedtime., Disp: , Rfl:    Prenatal Vit-Fe Fumarate-FA (PRENATAL MULTIVITAMIN) TABS tablet, Take 1 tablet by mouth daily at 12 noon., Disp: , Rfl:    sertraline (ZOLOFT) 25 MG tablet, Take  1 tablet (25 mg total) by mouth daily. (Patient not taking: Reported on 12/13/2022), Disp: 30 tablet, Rfl: 2  Observations/Objective: Today's Vitals   12/19/22 1359  Weight: 238 lb (108 kg)  Height: 5' 5.5" (1.664 m)  PainSc: 0-No pain   Physical Exam Patient is alert and no acute distress noted.   Assessment and Plan: Anxiety Assessment & Plan:    11/07/2022    2:57 PM 08/17/2022    3:11 PM 08/17/2022    3:00 PM 07/06/2022   11:41 AM  GAD 7 : Generalized Anxiety Score  Nervous, Anxious, on Edge 1 1 0 0  Control/stop worrying 1 1 0 0  Worry too much - different things 1 1 0 0  Trouble relaxing 1 1 0 0  Restless 0 1 0 0  Easily annoyed or irritable 1 1 0 0  Afraid - awful might happen 1 1 0 0  Total GAD 7 Score 6 7 0 0  Anxiety Difficulty Not difficult at all Somewhat difficult Not difficult at all    Patient [facility] Advise to discontinue Zoloft  Patient does not want to start another alternative medication and wants to start therapy sessions  Advise try incorporating relaxation techniques like deep breathing exercises or mindfulness meditation into your daily routine, as they can reduce stress and promote calm. Regular physical activity, even a short walk, can also boost mood and decrease anxiety. Prioritizing good sleep hygiene, such as maintaining a consistent sleep schedule and avoiding screens before bed     Follow Up Instructions: No follow-ups on file.  I discussed the assessment and treatment plan with the patient. The patient was provided an opportunity to ask questions, and all were answered. The patient agreed with the plan and demonstrated an understanding of the instructions.   The patient was advised to call back or seek an in-person evaluation if the symptoms worsen or if the condition fails to improve as anticipated.  The above assessment and management plan was discussed with the patient. The patient verbalized understanding  of and has agreed to the management plan.   Cruzita Lederer Newman Nip, FNP

## 2022-12-19 ENCOUNTER — Encounter: Payer: Self-pay | Admitting: Family Medicine

## 2022-12-19 ENCOUNTER — Telehealth: Payer: BC Managed Care – PPO | Admitting: Family Medicine

## 2022-12-19 ENCOUNTER — Encounter: Payer: Self-pay | Admitting: Internal Medicine

## 2022-12-19 VITALS — Ht 65.5 in | Wt 238.0 lb

## 2022-12-19 DIAGNOSIS — F419 Anxiety disorder, unspecified: Secondary | ICD-10-CM

## 2022-12-19 NOTE — Assessment & Plan Note (Signed)
    11/07/2022    2:57 PM 08/17/2022    3:11 PM 08/17/2022    3:00 PM 07/06/2022   11:41 AM  GAD 7 : Generalized Anxiety Score  Nervous, Anxious, on Edge 1 1 0 0  Control/stop worrying 1 1 0 0  Worry too much - different things 1 1 0 0  Trouble relaxing 1 1 0 0  Restless 0 1 0 0  Easily annoyed or irritable 1 1 0 0  Afraid - awful might happen 1 1 0 0  Total GAD 7 Score 6 7 0 0  Anxiety Difficulty Not difficult at all Somewhat difficult Not difficult at all    Patient requesting [facility] to [facility] Advise to discontinue Zoloft  Patient does not want to start another alternative medication and wants to start therapy sessions  Advise try incorporating relaxation techniques like deep breathing exercises or mindfulness meditation into your daily routine, as they can reduce stress and promote calm. Regular physical activity, even a short walk, can also boost mood and decrease anxiety. Prioritizing good sleep hygiene, such as maintaining a consistent sleep schedule and avoiding screens before bed

## 2022-12-20 NOTE — Telephone Encounter (Signed)
Pt. Evidently missed call due to work issues. If you need her to be updated on anything please advise. Thank you.

## 2022-12-21 NOTE — Therapy (Signed)
OUTPATIENT PHYSICAL THERAPY CERVICAL EVALUATION   Patient Name: Megan Houston MRN: [id number] DOB:Feb 01, 1991, 32 y.o., female Today's Date: 12/24/2022  END OF SESSION:  PT End of Session - 12/24/22 0721     Visit Number 1    Number of Visits 3    Date for PT Re-Evaluation 02/04/23    Authorization Type Blue cross blue shield    PT Start Time 0721    PT Stop Time 0800    PT Time Calculation (min) 39 min    Activity Tolerance Patient tolerated treatment well    Behavior During Therapy [facility] for tasks assessed/performed             Past Medical History:  Diagnosis Date   Anxiety    Asthma    Bipolar disorder (HCC)    Depression    Heart murmur    Lipoma of back    MDD (major depressive disorder)    PMDD (premenstrual dysphoric disorder)    Pregnancy induced hypertension    Severe preeclampsia, postpartum condition 05/09/2021   Past Surgical History:  Procedure Laterality Date   CESAREAN SECTION N/A 05/10/2021   Procedure: CESAREAN SECTION;  Surgeon: Kathrynn Running, MD;  Location: [facility] LD ORS;  Service: Obstetrics;  Laterality: N/A;   LIPOMA EXCISION N/A 01/01/2020   Procedure: EXCISION of LIPOMA from BACK;  Surgeon: Franky Macho, MD;  Location: [facility] ORS;  Service: General;  Laterality: N/A;   Patient Active Problem List   Diagnosis Date Noted   Family history of heart failure 12/13/2022   Palpitations 11/07/2022   Encounter for routine adult physical exam with abnormal findings 08/17/2022   S/P emergency cesarean section 05/11/2021   Severe preeclampsia, postpartum condition 05/09/2021   Rubella non-immune status, antepartum 11/17/2020   Anxiety 12/15/2018   PMDD (premenstrual dysphoric disorder) 09/16/2018    PCP: Rica Records, FNP  REFERRING PROVIDER: Ocie Doyne, MD  REFERRING DIAG: M54.2 (ICD-10-CM) - Cervicalgia  THERAPY DIAG:  Neck pain  Abnormal posture  Rationale for Evaluation and Treatment: Rehabilitation  ONSET DATE:  earlier this year  SUBJECTIVE:                                                                                                                                                                                                         SUBJECTIVE STATEMENT: She usually pops her neck; one time she popped her neck and then the neck became more stiff and started hurting; range of motion seems limited.  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  History of migraine Hand dominance: Right  PERTINENT HISTORY:  History of migraine  PAIN:  Are you having pain? Yes: NPRS scale: today 4/10; 1-8/10 Pain location: right side of the neck sometimes up into the head and down into right shoulder Pain description: stiff; tight, pressure; aching Aggravating factors: migraine, turning head to the right Relieving factors: stretches and ibuprophen  PRECAUTIONS: None      FALLS:  Has patient fallen in last 6 months? 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  DIAGNOSTIC FINDINGS:  none  PATIENT SURVEYS:  FOTO 72  COGNITION: Overall cognitive status: Within functional limits for tasks assessed  SENSATION: WFL  POSTURE: rounded shoulders and forward head  PALPATION: Noted tightness right side upper trap and levator > left   CERVICAL ROM:   Active ROM AROM (deg) eval  Flexion 33  Extension 56  Right lateral flexion 34  Left lateral flexion 41  Right rotation 58*  Left rotation 71   (Blank rows = not tested)  UPPER EXTREMITY ROM:  Active ROM Right eval Left eval  Shoulder flexion    Shoulder extension    Shoulder abduction    Shoulder adduction    Shoulder extension    Shoulder internal  rotation    Shoulder external rotation    Elbow flexion    Elbow extension    Wrist flexion    Wrist extension    Wrist ulnar deviation    Wrist radial deviation    Wrist pronation    Wrist supination     (Blank rows = not tested)  UPPER EXTREMITY MMT:  MMT Right eval Left eval  Shoulder flexion 4+ 5  Shoulder extension    Shoulder abduction 5 5  Shoulder adduction    Shoulder extension    Shoulder internal rotation    Shoulder external rotation    Middle trapezius    Lower trapezius    Elbow flexion    Elbow extension    Wrist flexion    Wrist extension    Wrist ulnar deviation    Wrist radial deviation    Wrist pronation    Wrist supination    Grip strength     (Blank rows = not tested)  CERVICAL SPECIAL TESTS:  Distraction test: Negative   TODAY'S TREATMENT:                                                                                                                              DATE: 12/24/2022   PATIENT EDUCATION:  Education details: Patient educated on exam findings, POC, scope of PT, HEP, and what to expect next visit. Person educated: Patient Education method:  Explanation, Demonstration, and Handouts Education comprehension: verbalized understanding, returned demonstration, verbal cues required, and tactile cues required  HOME EXERCISE PROGRAM: Access Code: [email or web address] URL: [email or web address] Date: 12/24/2022 Prepared by: [facility] - [facility]hab  Exercises - Seated Cervical Sidebending Stretch  - 2-3 x daily - 7 x weekly - 1 sets - 5 reps - 20 sec hold - Seated Levator Scapulae Stretch  - 2-3 x daily - 7 x weekly - 1 sets - 5 reps - 20 sec hold - Seated Cervical Retraction  - 5 x daily - 7 x weekly - 1 sets - 5-10 reps  ASSESSMENT:  CLINICAL IMPRESSION: Patient is a 32 y.o. female who was seen today for physical therapy evaluation and treatment for M54.2 (ICD-10-CM) - Cervicalgia. Patient demonstrates decreased strength, ROM restriction,  reduced flexibility, increased tenderness to palpation and postural abnormalities which are likely contributing to symptoms of pain and are negatively impacting patient ability to perform ADLs. Patient will benefit from skilled physical therapy services to address these deficits to reduce pain and improve level of function with ADLs   OBJECTIVE IMPAIRMENTS: decreased activity tolerance, decreased knowledge of condition, decreased mobility, decreased ROM, hypomobility, increased fascial restrictions, impaired perceived functional ability, postural dysfunction, and pain.   ACTIVITY LIMITATIONS: carrying, lifting, sitting, and caring for others  PARTICIPATION LIMITATIONS: meal prep, cleaning, driving, shopping, and occupation  REHAB POTENTIAL: Good  CLINICAL DECISION MAKING: Stable/uncomplicated  EVALUATION COMPLEXITY: Low   GOALS: Goals reviewed with patient? No  SHORT TERM GOALS: Target date: 01/14/2023  patient will be independent with initial HEP  Baseline:  Goal status: INITIAL  2.  Patient will self report 50% improvement to improve tolerance for functional activity  Baseline:  Goal status: INITIAL   LONG TERM GOALS: Target date: 02/04/2023  Patient will self report 75% improvement to improve tolerance for functional activity  Baseline:  Goal status: INITIAL  2.  Patient will be independent in self management strategies to improve quality of life and functional outcomes.  Baseline:  Goal status: INITIAL  3.  Patient will improve right cervical rotation by 10 degrees to improve ability to scan for safety with driving Baseline: 58 Goal status: INITIAL  4.  Patient will work x 6 hours without neck pain > 2/10 Baseline:  Goal status: INITIAL  5.  Patient will improve FOTO score by 5 points to demonstrate improved perceived functional mobility Baseline: 72 Goal status: INITIAL    PLAN:  PT FREQUENCY: 1x/week  PT DURATION: other: every other week  PLANNED  INTERVENTIONS: 97164- PT Re-evaluation, 97110-Therapeutic exercises, 97530- Therapeutic activity, 97112- Neuromuscular re-education, 97535- Self Care, 33295- Manual therapy, [phone]- Gait training, [phone]- Orthotic Fit/training, [phone]- Canalith repositioning, [id number]- Aquatic Therapy, [phone]- Splinting, Patient/Family education, Balance training, Stair training, Taping, Dry Needling, Joint mobilization, Joint manipulation, Spinal manipulation, Spinal mobilization, Scar mobilization, and DME instructions.   PLAN FOR NEXT SESSION: Review HEP and goals; manual as needed; add cervical rotation stretch with towel; cervical mobility; postural strengthening.   8:16 AM, 12/24/22 Avyana Puffenbarger Small Kolson Chovanec MPT [place] [facility] [place] [phone]

## 2022-12-24 ENCOUNTER — Other Ambulatory Visit: Payer: Self-pay

## 2022-12-24 ENCOUNTER — Ambulatory Visit (HOSPITAL_COMMUNITY): Payer: BC Managed Care – PPO | Attending: Internal Medicine

## 2022-12-24 DIAGNOSIS — R293 Abnormal posture: Secondary | ICD-10-CM | POA: Diagnosis not present

## 2022-12-24 DIAGNOSIS — M542 Cervicalgia: Secondary | ICD-10-CM | POA: Insufficient documentation

## 2022-12-25 DIAGNOSIS — R002 Palpitations: Secondary | ICD-10-CM | POA: Diagnosis not present

## 2022-12-31 ENCOUNTER — Ambulatory Visit: Payer: BC Managed Care – PPO | Attending: Internal Medicine

## 2022-12-31 DIAGNOSIS — R002 Palpitations: Secondary | ICD-10-CM | POA: Diagnosis not present

## 2022-12-31 DIAGNOSIS — Z8249 Family history of ischemic heart disease and other diseases of the circulatory system: Secondary | ICD-10-CM

## 2022-12-31 LAB — ECHOCARDIOGRAM COMPLETE
AR max vel: 2.53 cm2
AV Area VTI: 2.8 cm2
AV Area mean vel: 2.61 cm2
AV Mean grad: 5 mm[Hg]
AV Peak grad: 9 mm[Hg]
Ao pk vel: 1.5 m/s
Area-P 1/2: 3.33 cm2
Calc EF: 66.4 %
MV VTI: 4.5 cm2
S' Lateral: 3.5 cm
Single Plane A2C EF: 72.8 %
Single Plane A4C EF: 54.1 %

## 2023-01-02 ENCOUNTER — Encounter: Payer: Self-pay | Admitting: Internal Medicine

## 2023-01-07 ENCOUNTER — Telehealth: Payer: Self-pay

## 2023-01-07 NOTE — Telephone Encounter (Signed)
-----   Message from Vishnu P Mallipeddi sent at 01/04/2023 12:56 PM EST ----- Normal pumping function of the heart, no valvular heart disease.  Normal echocardiogram.

## 2023-01-07 NOTE — Telephone Encounter (Signed)
Patient informed and verbalized understanding of plan. 

## 2023-01-07 NOTE — Telephone Encounter (Signed)
Patient informed and verbalized understanding of plan. Will send message to provider regarding concerns for Diltiazem.

## 2023-01-07 NOTE — Telephone Encounter (Signed)
-----   Message from Vishnu P Mallipeddi sent at 01/04/2023 12:57 PM EST ----- Event monitor is unremarkable with no evidence of arrhythmias.  Symptoms correlated with normal sinus rhythm (67 to 144 bpm), PAC and PVC. But PAC and PVC burden is minimal.  Can recommend diltiazem 30 mg every 8 hours as needed for palpitations.

## 2023-01-11 ENCOUNTER — Ambulatory Visit (HOSPITAL_COMMUNITY): Payer: BC Managed Care – PPO

## 2023-01-11 ENCOUNTER — Encounter (HOSPITAL_COMMUNITY): Payer: Self-pay

## 2023-01-15 ENCOUNTER — Encounter (HOSPITAL_COMMUNITY): Payer: Self-pay

## 2023-01-15 NOTE — Therapy (Signed)
[facility] at [facility] [address] [place], [place], [address] Phone: [phone]   Fax:  [phone]  Patient Details  Name: Megan Houston MRN: [id number] Date of Birth: 07/03/1990 Referring Provider:  No ref. provider found  PHYSICAL THERAPY DISCHARGE SUMMARY  Visits from Start of Care: 1  Current functional level related to goals / functional outcomes: unknown   Remaining deficits: unknown   Education / Equipment: HEP   Patient agrees to discharge. Patient goals were not met. Patient is being discharged due to the patient's request.   [facility] Outpatient Rehabilitation at [facility] [address] [place], [place], [address] Phone: [phone]   Fax:  [phone]

## 2023-01-25 ENCOUNTER — Encounter (HOSPITAL_COMMUNITY): Payer: BC Managed Care – PPO

## 2023-01-29 ENCOUNTER — Encounter: Payer: Self-pay | Admitting: Family Medicine

## 2023-01-29 ENCOUNTER — Other Ambulatory Visit: Payer: Self-pay | Admitting: Family Medicine

## 2023-01-29 DIAGNOSIS — F419 Anxiety disorder, unspecified: Secondary | ICD-10-CM

## 2023-01-29 NOTE — Telephone Encounter (Signed)
New referral placed.

## 2023-01-30 ENCOUNTER — Other Ambulatory Visit: Payer: Self-pay

## 2023-02-04 ENCOUNTER — Other Ambulatory Visit: Payer: Self-pay | Admitting: Medical Genetics

## 2023-02-08 ENCOUNTER — Other Ambulatory Visit (HOSPITAL_COMMUNITY)
Admission: RE | Admit: 2023-02-08 | Discharge: 2023-02-08 | Disposition: A | Discharge status: Home / Self Care | Payer: Self-pay | Source: Ambulatory Visit | Attending: Oncology | Admitting: Oncology

## 2023-02-10 NOTE — Patient Instructions (Signed)

## 2023-02-10 NOTE — Progress Notes (Unsigned)
   Established Patient Office Visit   Subjective  Patient ID: Megan Houston, female    DOB: Feb 14, 1991  Age: 32 y.o. MRN: [id number]  No chief complaint on file.   She  has a past medical history of Anxiety, Asthma, Bipolar disorder (HCC), Depression, Heart murmur, Lipoma of back, MDD (major depressive disorder), PMDD (premenstrual dysphoric disorder), Pregnancy induced hypertension, and Severe preeclampsia, postpartum condition (05/09/2021).  HPI  ROS    Objective:     There were no vitals taken for this visit. {Vitals History (Optional):23777}  Physical Exam   No results found for any visits on 02/11/23.  The ASCVD Risk score (Arnett DK, et al., 2019) failed to calculate for the following reasons:   The 2019 ASCVD risk score is only valid for ages 31 to 60    Assessment & Plan:  There are no diagnoses linked to this encounter.  No follow-ups on file.   Cruzita Lederer Newman Nip, FNP

## 2023-02-11 ENCOUNTER — Telehealth: Payer: Self-pay

## 2023-02-11 ENCOUNTER — Encounter: Payer: Self-pay | Admitting: Family Medicine

## 2023-02-11 ENCOUNTER — Ambulatory Visit: Payer: BC Managed Care – PPO | Admitting: Family Medicine

## 2023-02-11 VITALS — BP 138/97 | HR 68 | Ht 65.5 in

## 2023-02-11 DIAGNOSIS — F419 Anxiety disorder, unspecified: Secondary | ICD-10-CM | POA: Diagnosis not present

## 2023-02-11 DIAGNOSIS — Z808 Family history of malignant neoplasm of other organs or systems: Secondary | ICD-10-CM

## 2023-02-11 DIAGNOSIS — B370 Candidal stomatitis: Secondary | ICD-10-CM | POA: Diagnosis not present

## 2023-02-11 MED ORDER — NYSTATIN 100000 UNIT/ML MT SUSP: 5.0000 mL | Freq: Four times a day (QID) | OROMUCOSAL | 0 refills | Status: DC

## 2023-02-11 NOTE — Assessment & Plan Note (Addendum)
Will treat with nystatin x 7 days  Explained patient brush teeth with a soft toothbrush, Use salt water mixture to rinse the mouth, Discussed probiotic foods such as yogurt contain good bacteria that keep thrush in check. Limit sugar promotes yeast growth, foods contain mold such as breads, aged cheese and alcohol.   Patient reported would like a refereal to ENT due to family hx oral cancer - maternal side Patient is currently non smoker- 1 pack year with quit date 2016

## 2023-02-11 NOTE — Telephone Encounter (Unsigned)
Copied from CRM [id number]. Topic: Clinical - Prescription Issue >> Feb 11, 2023 10:49 AM Fuller Mandril wrote: Reason for CRM: Pharmacy called. Discrepancy with nystatin (MYCOSTATIN) 100000 UNIT/ML suspension. States 7 days but written for 3 days. Pharmacy wanted to know if pt already has some or if the full 7 day supply is needed.

## 2023-02-11 NOTE — Telephone Encounter (Signed)
error 

## 2023-02-12 ENCOUNTER — Telehealth: Payer: BC Managed Care – PPO | Admitting: Family Medicine

## 2023-02-12 DIAGNOSIS — R3989 Other symptoms and signs involving the genitourinary system: Secondary | ICD-10-CM | POA: Diagnosis not present

## 2023-02-12 MED ORDER — CEPHALEXIN 500 MG PO CAPS: 500.0000 mg | ORAL_CAPSULE | Freq: Two times a day (BID) | ORAL | 0 refills | Status: AC

## 2023-02-12 NOTE — Progress Notes (Signed)
E-Visit for Urinary Problems  We are sorry that you are not feeling well.  Here is how we plan to help!  Based on what you shared with me it looks like you most likely have a simple urinary tract infection.  A UTI (Urinary Tract Infection) is a bacterial infection of the bladder.  Most cases of urinary tract infections are simple to treat but a key part of your care is to encourage you to drink plenty of fluids and watch your symptoms carefully.  I have prescribed Keflex 500 mg twice a day for 7 days.  Your symptoms should gradually improve. Call us if the burning in your urine worsens, you develop worsening fever, back pain or pelvic pain or if your symptoms do not resolve after completing the antibiotic.  Urinary tract infections can be prevented by drinking plenty of water to keep your body hydrated.  Also be sure when you wipe, wipe from front to back and don't hold it in!  If possible, empty your bladder every 4 hours.  HOME CARE Drink plenty of fluids Compete the full course of the antibiotics even if the symptoms resolve Remember, when you need to go.go. Holding in your urine can increase the likelihood of getting a UTI! GET HELP RIGHT AWAY IF: You cannot urinate You get a high fever Worsening back pain occurs You see blood in your urine You feel sick to your stomach or throw up You feel like you are going to pass out  MAKE SURE YOU  Understand these instructions. Will watch your condition. Will get help right away if you are not doing well or get worse.   Thank you for choosing an e-visit.  Your e-visit answers were reviewed by a board certified advanced clinical practitioner to complete your personal care plan. Depending upon the condition, your plan could have included both over the counter or prescription medications.  Please review your pharmacy choice. Make sure the pharmacy is open so you can pick up prescription now. If there is a problem, you may contact your  provider through MyChart messaging and have the prescription routed to another pharmacy.  Your safety is important to us. If you have drug allergies check your prescription carefully.   For the next 24 hours you can use MyChart to ask questions about today's visit, request a non-urgent call back, or ask for a work or school excuse. You will get an email in the next two days asking about your experience. I hope that your e-visit has been valuable and will speed your recovery.    have provided 5 minutes of non face to face time during this encounter for chart review and documentation.   

## 2023-02-25 LAB — GENECONNECT MOLECULAR SCREEN: Genetic Analysis Overall Interpretation: NEGATIVE

## 2023-03-01 ENCOUNTER — Ambulatory Visit: Payer: BC Managed Care – PPO

## 2023-03-01 ENCOUNTER — Ambulatory Visit
Admission: RE | Admit: 2023-03-01 | Discharge: 2023-03-01 | Disposition: A | Discharge status: Home / Self Care | Payer: BC Managed Care – PPO | Source: Ambulatory Visit | Attending: Family Medicine | Admitting: Family Medicine

## 2023-03-01 VITALS — BP 136/87 | HR 89 | Temp 97.4°F | Resp 16

## 2023-03-01 DIAGNOSIS — R350 Frequency of micturition: Secondary | ICD-10-CM

## 2023-03-01 DIAGNOSIS — R102 Pelvic and perineal pain: Secondary | ICD-10-CM

## 2023-03-01 LAB — POCT URINALYSIS DIP (MANUAL ENTRY)
Bilirubin, UA: NEGATIVE
Blood, UA: NEGATIVE
Glucose, UA: NEGATIVE mg/dL
Ketones, POC UA: NEGATIVE mg/dL
Leukocytes, UA: NEGATIVE
Nitrite, UA: NEGATIVE
Protein Ur, POC: NEGATIVE mg/dL
Spec Grav, UA: 1.025 (ref 1.010–1.025)
Urobilinogen, UA: 0.2 U/dL
pH, UA: 8 (ref 5.0–8.0)

## 2023-03-01 NOTE — ED Triage Notes (Signed)
 Pt reports she has pelvic pain, painful urination, frequent urination x 2 weeks

## 2023-03-01 NOTE — Discharge Instructions (Signed)
 You do not appear to have a urinary tract infection today.  Follow-up with your OB/GYN for recheck and further evaluation.  Stay well-hydrated

## 2023-03-01 NOTE — ED Provider Notes (Signed)
 [facility]    CSN: [id number] Arrival date & time: 03/01/23  1434      History   Chief Complaint Chief Complaint  Patient presents with   Urinary Frequency    I have symptoms of a uti & want to make sure - Entered by patient    HPI Megan Houston is a 33 y.o. female.   Patient presenting today with 2-week history of suprapubic pressure and cramping after voiding, urinary frequency.  States she has had an issue with frequency and urgency since her pregnancy but that this feels a bit worse than typical.  Denies fever, chills, hematuria, nausea, vomiting, vaginal discharge, concern for STIs.    Past Medical History:  Diagnosis Date   Anxiety    Asthma    Bipolar disorder (HCC)    Depression    Heart murmur    Lipoma of back    MDD (major depressive disorder)    PMDD (premenstrual dysphoric disorder)    Pregnancy induced hypertension    Severe preeclampsia, postpartum condition 05/09/2021    Patient Active Problem List   Diagnosis Date Noted   Oral thrush 02/11/2023   Family history of heart failure 12/13/2022   Palpitations 11/07/2022   Encounter for routine adult physical exam with abnormal findings 08/17/2022   S/P emergency cesarean section 05/11/2021   Severe preeclampsia, postpartum condition 05/09/2021   Rubella non-immune status, antepartum 11/17/2020   Anxiety 12/15/2018   PMDD (premenstrual dysphoric disorder) 09/16/2018    Past Surgical History:  Procedure Laterality Date   CESAREAN SECTION N/A 05/10/2021   Procedure: CESAREAN SECTION;  Surgeon: Kandis Devaughn Sayres, MD;  Location: [facility];  Service: Obstetrics;  Laterality: N/A;   LIPOMA EXCISION N/A 01/01/2020   Procedure: EXCISION of LIPOMA from BACK;  Surgeon: Mavis Anes, MD;  Location: [facility];  Service: General;  Laterality: N/A;    OB History     Gravida  1   Para  1   Term  1   Preterm  0   AB  0   Living  1      SAB  0   IAB  0   Ectopic  0   Multiple  0    Live Births  1            Home Medications    Prior to Admission medications   Medication Sig Start Date End Date Taking? Authorizing Provider  b complex vitamins capsule Take 1 capsule by mouth at bedtime.    [provider]  Cholecalciferol (VITAMIN D ) 125 MCG (5000 UT) CAPS Take 1 capsule by mouth at bedtime.    [provider]  Magnesium  250 MG TABS Take 1 tablet by mouth at bedtime. 12/20/20   [provider]  nystatin  (MYCOSTATIN ) 100000 UNIT/ML suspension Use as directed 5 mLs (500,000 Units total) in the mouth or throat 4 (four) times daily. For 7 days 02/11/23   Del Wilhelmena Lloyd Sola, FNP  Prenatal Vit-Fe Fumarate-FA (PRENATAL MULTIVITAMIN) TABS tablet Take 1 tablet by mouth daily at 12 noon.    [provider]  sertraline  (ZOLOFT ) 25 MG tablet Take 1 tablet (25 mg total) by mouth daily. 11/07/22   Del Wilhelmena Lloyd Sola, FNP    Family History Family History  Problem Relation Age of Onset   Diabetes Mother    Hypertension Mother    Bipolar disorder Mother    Alcohol abuse Mother    Drug abuse Mother  Cancer Mother        cervical   Leukemia Mother    OCD Father    Anxiety disorder Father    Diabetes Father    Hypertension Father    Bipolar disorder Sister    Drug abuse Sister    Alcohol abuse Sister    Diabetes Maternal Grandmother    Bipolar disorder Maternal Grandmother    Anxiety disorder Maternal Grandmother    Schizophrenia Maternal Grandmother    Liver disease Paternal Grandmother    Hypertension Paternal Aunt    Diabetes Paternal Aunt    Hypertension Other    Diabetes Other     Social History Social History   Tobacco Use   Smoking status: Former    Current packs/day: 0.00    Average packs/day: 0.3 packs/day for 4.0 years (1.0 ttl pk-yrs)    Types: Cigarettes    Start date: 05/27/2010    Quit date: 05/27/2014    Years since quitting: 8.7   Smokeless tobacco: Never  Vaping Use   Vaping status: Never  Used  Substance Use Topics   Alcohol use: Not Currently   Drug use: No     Allergies   Codeine and Hydrocodone -acetaminophen    Review of Systems Review of Systems Per HPI  Physical Exam Triage Vital Signs ED Triage Vitals [03/01/23 1448]  Encounter Vitals Group     BP 136/87     Systolic BP Percentile      Diastolic BP Percentile      Pulse Rate 89     Resp 16     Temp (!) 97.4 F (36.3 C)     Temp Source Oral     SpO2 97 %     Weight      Height      Head Circumference      Peak Flow      Pain Score 6     Pain Loc      Pain Education      Exclude from Growth Chart    No data found.  Updated Vital Signs BP 136/87 (BP Location: Right Arm)   Pulse 89   Temp (!) 97.4 F (36.3 C) (Oral)   Resp 16   LMP 01/23/2023 (Exact Date) Comment: end date  SpO2 97%   Breastfeeding Yes   Visual Acuity Right Eye Distance:   Left Eye Distance:   Bilateral Distance:    Right Eye Near:   Left Eye Near:    Bilateral Near:     Physical Exam Vitals and nursing note reviewed.  Constitutional:      Appearance: Normal appearance. She is not ill-appearing.  HENT:     Head: Atraumatic.  Eyes:     Extraocular Movements: Extraocular movements intact.     Conjunctiva/sclera: Conjunctivae normal.  Cardiovascular:     Rate and Rhythm: Normal rate and regular rhythm.     Heart sounds: Normal heart sounds.  Pulmonary:     Effort: Pulmonary effort is normal.     Breath sounds: Normal breath sounds.  Abdominal:     General: Bowel sounds are normal. There is no distension.     Palpations: Abdomen is soft.     Tenderness: There is no abdominal tenderness. There is no right CVA tenderness, left CVA tenderness or guarding.  Genitourinary:    Comments: GU exam deferred to OB/GYN Musculoskeletal:        General: Normal range of motion.     Cervical back: Normal range of motion  and neck supple.  Skin:    General: Skin is warm and dry.  Neurological:     Mental Status: She is  alert and oriented to person, place, and time.  Psychiatric:        Mood and Affect: Mood normal.        Thought Content: Thought content normal.        Judgment: Judgment normal.      UC Treatments / Results  Labs (all labs ordered are listed, but only abnormal results are displayed) Labs Reviewed  POCT URINALYSIS DIP (MANUAL ENTRY)    EKG   Radiology No results found.  Procedures Procedures (including critical care time)  Medications Ordered in UC Medications - No data to display  Initial Impression / Assessment and Plan / UC Course  I have reviewed the triage vital signs and the nursing notes.  Pertinent labs & imaging results that were available during my care of the patient were reviewed by me and considered in my medical decision making (see chart for details).     Vitals and exam very reassuring today.  Urinalysis without evidence of urinary tract infection.  Discussed pelvic floor exercises, good hydration and OB/GYN follow-up.  Return for worsening symptoms.  Final Clinical Impressions(s) / UC Diagnoses   Final diagnoses:  Suprapubic pain  Urinary frequency     Discharge Instructions      You do not appear to have a urinary tract infection today.  Follow-up with your OB/GYN for recheck and further evaluation.  Stay well-hydrated    ED Prescriptions   None    PDMP not reviewed this encounter.   Stuart Vernell Norris, [place] 03/01/23 1646

## 2023-03-21 ENCOUNTER — Other Ambulatory Visit: Payer: Self-pay | Admitting: Family Medicine

## 2023-03-22 ENCOUNTER — Ambulatory Visit (INDEPENDENT_AMBULATORY_CARE_PROVIDER_SITE_OTHER): Payer: BC Managed Care – PPO | Admitting: Otolaryngology

## 2023-03-22 ENCOUNTER — Telehealth: Payer: Self-pay | Admitting: Family Medicine

## 2023-03-22 ENCOUNTER — Encounter (INDEPENDENT_AMBULATORY_CARE_PROVIDER_SITE_OTHER): Payer: Self-pay | Admitting: Otolaryngology

## 2023-03-22 VITALS — BP 140/84 | HR 85 | Ht 65.5 in | Wt 238.0 lb

## 2023-03-22 DIAGNOSIS — R0982 Postnasal drip: Secondary | ICD-10-CM | POA: Diagnosis not present

## 2023-03-22 DIAGNOSIS — K219 Gastro-esophageal reflux disease without esophagitis: Secondary | ICD-10-CM

## 2023-03-22 DIAGNOSIS — K1379 Other lesions of oral mucosa: Secondary | ICD-10-CM

## 2023-03-22 NOTE — Telephone Encounter (Signed)
Called pt to get scheduled with Arlys John- pt wants a referral to a female counselor. Please advise  Thanks

## 2023-03-22 NOTE — Progress Notes (Signed)
ENT CONSULT:  Reason for Consult: white spot left side of the throat  HPI: Discussed the use of AI scribe software for clinical note transcription with the patient, who gave verbal consent to proceed.  History of Present Illness   The patient is a 33 year old female who presents with a white spot in the back of her throat (left side) She was referred by her dentist for evaluation of the white spot.  She has a white spot located on the back wall of her throat, towards the left side, which is not on the tongue or tonsil. Over the years, she has noticed smaller white dots around it. The spot is not painful and has not caused any irritation.  She recalls being diagnosed with oral thrush by PCP but disagreed with the diagnosis and did not take the prescribed nystatin. Initially, she consulted a dentist who dismissed her concerns and referred her to her primary doctor.  She has a history of reflux and heartburn, which she manages by avoiding certain foods/diet changes. During pregnancy, she experienced more symptoms and took famotidine. She reports frequent postnasal drainage and throat clearing.  She quit smoking in 2014.     Records Reviewed:  FNP Lonna Cobb [place] Oral thrush Assessment & Plan: Will treat with nystatin x 7 days  Explained patient brush teeth with a soft toothbrush, Use salt water mixture to rinse the mouth, Discussed probiotic foods such as yogurt contain good bacteria that keep thrush in check. Limit sugar promotes yeast growth, foods contain mold such as breads, aged cheese and alcohol.   Patient reported would like a refereal to ENT due to family hx oral cancer - maternal side Patient is currently non smoker- 1 pack year with quit date 2016   Orders: -     Nystatin; Use as directed 5 mLs (500,000 Units total) in the mouth or throat 4 (four) times daily. For 7 days  Dispense: 60 mL; Refill: 0    Past Medical History:  Diagnosis Date   Anxiety    Asthma    Bipolar  disorder (HCC)    Depression    Heart murmur    Lipoma of back    MDD (major depressive disorder)    PMDD (premenstrual dysphoric disorder)    Pregnancy induced hypertension    Severe preeclampsia, postpartum condition 05/09/2021    Past Surgical History:  Procedure Laterality Date   CESAREAN SECTION N/A 05/10/2021   Procedure: CESAREAN SECTION;  Surgeon: Kathrynn Running, MD;  Location: [facility];  Service: Obstetrics;  Laterality: N/A;   LIPOMA EXCISION N/A 01/01/2020   Procedure: EXCISION of LIPOMA from BACK;  Surgeon: Franky Macho, MD;  Location: [facility];  Service: General;  Laterality: N/A;    Family History  Problem Relation Age of Onset   Diabetes Mother    Hypertension Mother    Bipolar disorder Mother    Alcohol abuse Mother    Drug abuse Mother    Cancer Mother        cervical   Leukemia Mother    OCD Father    Anxiety disorder Father    Diabetes Father    Hypertension Father    Bipolar disorder Sister    Drug abuse Sister    Alcohol abuse Sister    Diabetes Maternal Grandmother    Bipolar disorder Maternal Grandmother    Anxiety disorder Maternal Grandmother    Schizophrenia Maternal Grandmother    Liver disease Paternal Grandmother  Hypertension Paternal Aunt    Diabetes Paternal Aunt    Hypertension Other    Diabetes Other     Social History:  reports that she quit smoking about 8 years ago. Her smoking use included cigarettes. She started smoking about 12 years ago. She has a 1 pack-year smoking history. She has never used smokeless tobacco. She reports that she does not currently use alcohol. She reports that she does not use drugs.  Allergies:  Allergies  Allergen Reactions   Codeine Nausea And Vomiting   Hydrocodone-Acetaminophen Nausea And Vomiting    Medications: I have reviewed the patient's current medications.  The PMH, PSH, Medications, Allergies, and SH were reviewed and updated.  ROS: Constitutional: Negative for fever, weight  loss and weight gain. Cardiovascular: Negative for chest pain and dyspnea on exertion. Respiratory: Is not experiencing shortness of breath at rest. Gastrointestinal: Negative for nausea and vomiting. Neurological: Negative for headaches. Psychiatric: The patient is not nervous/anxious  Blood pressure (!) 140/84, pulse 85, height 5' 5.5" (1.664 m), weight 238 lb (108 kg), last menstrual period 01/23/2023, SpO2 96%, currently breastfeeding.  PHYSICAL EXAM:  Exam: General: Well-developed, well-nourished Respiratory Respiratory effort: Equal inspiration and expiration without stridor Cardiovascular Peripheral Vascular: Warm extremities with equal color/perfusion Eyes: No nystagmus with equal extraocular motion bilaterally Neuro/Psych/Balance: Patient oriented to person, place, and time; Appropriate mood and affect; Gait is intact with no imbalance; Cranial nerves I-XII are intact Head and Face Inspection: Normocephalic and atraumatic without mass or lesion Palpation: Facial skeleton intact without bony stepoffs Salivary Glands: No mass or tenderness Facial Strength: Facial motility symmetric and full bilaterally ENT Pinna: External ear intact and fully developed External canal: Canal is patent with intact skin Tympanic Membrane: Clear and mobile External Nose: No scar or anatomic deformity Internal Nose: Septum is relatively straight on anterior rhinoscopy. Bilateral inferior turbinate hypertrophy.  Lips, Teeth, and gums: Mucosa and teeth intact and viable TMJ: No pain to palpation with full mobility Oral cavity/oropharynx: No erythema or exudate, no lesions present, slight white discoloration of mucosa above left tonsil, no leukoplakia/erythroplakia, no lesion Neck Neck and Trachea: Midline trachea without mass or lesion Thyroid: No mass or nodularity Lymphatics: No lymphadenopathy  Procedure: none  Assessment/Plan: Encounter Diagnoses  Name Primary?   Mouth sore Yes    Chronic GERD     Assessment and Plan    Concern for Oral Lesion Presents with a white spot above left tonsil, stable for years without change in size or symptoms. The lesion is faint discoloration of mucosa 2 mm in size, no leukoplakia or erythroplakia, non-raised, and lacks a defined border, making it unlikely to be precancerous or indicative of lichen planus or any other lesion that requires biopsy. Likely normal variant. History of smoking, quit in 2014. The lesion does not resemble oral thrush or any concerning pathology. Explained that precancerous lesions are typically raised with defined borders. Reassured that the lesion is likely a normal variant and does not warrant a biopsy. - Reassure about the benign nature of the lesion - No biopsy or further intervention needed  Postnasal Drip Reports frequent postnasal drainage and throat clearing. No further details or treatment discussed. - Monitor symptoms and manage as needed - OTC nasal sprays  Gastroesophageal Reflux Disease (GERD) Reflux and heartburn managed by dietary modifications. Symptoms were more pronounced during pregnancy, managed with famotidine. No current symptoms reported. - Continue dietary modifications to manage reflux symptoms - Consider famotidine if symptoms recur.  Thank you for allowing me to participate in the care of this patient. Please do not hesitate to contact me with any questions or concerns.   Ashok Croon, MD Otolaryngology [facility] Phone: [phone] Fax: [phone]    03/22/2023, 1:26 PM

## 2023-03-25 ENCOUNTER — Other Ambulatory Visit: Payer: Self-pay | Admitting: Family Medicine

## 2023-03-25 DIAGNOSIS — F419 Anxiety disorder, unspecified: Secondary | ICD-10-CM

## 2023-03-25 NOTE — Telephone Encounter (Signed)
 sent

## 2023-04-05 ENCOUNTER — Encounter: Payer: Self-pay | Admitting: Family Medicine

## 2023-04-05 ENCOUNTER — Other Ambulatory Visit: Payer: Self-pay | Admitting: Family Medicine

## 2023-04-05 MED ORDER — ALBUTEROL SULFATE HFA 108 (90 BASE) MCG/ACT IN AERS: 2.0000 | INHALATION_SPRAY | Freq: Four times a day (QID) | RESPIRATORY_TRACT | 2 refills | Status: AC | PRN

## 2023-04-05 NOTE — Telephone Encounter (Signed)
sent

## 2023-05-14 ENCOUNTER — Encounter: Payer: Self-pay | Admitting: Family Medicine

## 2023-05-14 NOTE — Telephone Encounter (Signed)
 Patient needs office visit.

## 2023-05-16 DIAGNOSIS — K6289 Other specified diseases of anus and rectum: Secondary | ICD-10-CM | POA: Diagnosis not present

## 2023-05-16 DIAGNOSIS — R634 Abnormal weight loss: Secondary | ICD-10-CM | POA: Diagnosis not present

## 2023-05-16 DIAGNOSIS — K625 Hemorrhage of anus and rectum: Secondary | ICD-10-CM | POA: Diagnosis not present

## 2023-05-16 DIAGNOSIS — R1010 Upper abdominal pain, unspecified: Secondary | ICD-10-CM | POA: Diagnosis not present

## 2023-05-17 NOTE — Telephone Encounter (Unsigned)
 Copied from CRM [id number]. Topic: Appointments - Transfer of Care >> May 16, 2023 10:24 AM Izetta Dakin wrote: Pt is requesting to transfer FROM: Rica Records, FNP Pt is requesting to transfer TO: Darral Dash, FNP Reason for requested transfer: Personal preference It is the responsibility of the team the patient would like to transfer to (Dr. Shon Hough) to reach out to the patient if for any reason this transfer is not acceptable.

## 2023-05-17 NOTE — Telephone Encounter (Signed)
This fine with me

## 2023-05-29 ENCOUNTER — Telehealth: Payer: Self-pay | Admitting: Psychiatry

## 2023-05-29 NOTE — Telephone Encounter (Signed)
 Pt cancelled appointment due to scheduling conflict

## 2023-05-30 ENCOUNTER — Ambulatory Visit: Payer: BC Managed Care – PPO | Admitting: Family Medicine

## 2023-06-04 DIAGNOSIS — D122 Benign neoplasm of ascending colon: Secondary | ICD-10-CM | POA: Diagnosis not present

## 2023-06-04 DIAGNOSIS — K449 Diaphragmatic hernia without obstruction or gangrene: Secondary | ICD-10-CM | POA: Diagnosis not present

## 2023-06-04 DIAGNOSIS — R634 Abnormal weight loss: Secondary | ICD-10-CM | POA: Diagnosis not present

## 2023-06-04 DIAGNOSIS — K222 Esophageal obstruction: Secondary | ICD-10-CM | POA: Diagnosis not present

## 2023-06-04 DIAGNOSIS — R1010 Upper abdominal pain, unspecified: Secondary | ICD-10-CM | POA: Diagnosis not present

## 2023-06-04 DIAGNOSIS — K3189 Other diseases of stomach and duodenum: Secondary | ICD-10-CM | POA: Diagnosis not present

## 2023-06-04 DIAGNOSIS — K648 Other hemorrhoids: Secondary | ICD-10-CM | POA: Diagnosis not present

## 2023-06-04 DIAGNOSIS — K6289 Other specified diseases of anus and rectum: Secondary | ICD-10-CM | POA: Diagnosis not present

## 2023-06-04 DIAGNOSIS — K625 Hemorrhage of anus and rectum: Secondary | ICD-10-CM | POA: Diagnosis not present

## 2023-06-04 DIAGNOSIS — K635 Polyp of colon: Secondary | ICD-10-CM | POA: Diagnosis not present

## 2023-06-07 DIAGNOSIS — R1013 Epigastric pain: Secondary | ICD-10-CM | POA: Diagnosis not present

## 2023-06-07 DIAGNOSIS — R110 Nausea: Secondary | ICD-10-CM | POA: Diagnosis not present

## 2023-06-07 DIAGNOSIS — R1011 Right upper quadrant pain: Secondary | ICD-10-CM | POA: Diagnosis not present

## 2023-06-21 DIAGNOSIS — F411 Generalized anxiety disorder: Secondary | ICD-10-CM | POA: Diagnosis not present

## 2023-07-05 DIAGNOSIS — F411 Generalized anxiety disorder: Secondary | ICD-10-CM | POA: Diagnosis not present

## 2023-07-26 DIAGNOSIS — F411 Generalized anxiety disorder: Secondary | ICD-10-CM | POA: Diagnosis not present

## 2023-08-15 ENCOUNTER — Ambulatory Visit
Admission: RE | Admit: 2023-08-15 | Discharge: 2023-08-15 | Disposition: A | Discharge status: Home / Self Care | Source: Ambulatory Visit | Attending: Nurse Practitioner | Admitting: Nurse Practitioner

## 2023-08-15 VITALS — BP 143/83 | HR 81 | Temp 98.3°F | Resp 20

## 2023-08-15 DIAGNOSIS — J22 Unspecified acute lower respiratory infection: Secondary | ICD-10-CM | POA: Diagnosis not present

## 2023-08-15 MED ORDER — GUAIFENESIN 100 MG/5ML PO LIQD: 10.0000 mL | Freq: Four times a day (QID) | ORAL | 0 refills | Status: DC | PRN

## 2023-08-15 MED ORDER — AMOXICILLIN 500 MG PO CAPS: 500.0000 mg | ORAL_CAPSULE | Freq: Two times a day (BID) | ORAL | 0 refills | Status: AC

## 2023-08-15 NOTE — Discharge Instructions (Addendum)
 Take medication as prescribed. Increase fluids allow for plenty of rest. You may take over-the-counter Tylenol  as needed for pain, fever, or general discomfort. Recommend use of a humidifier in your bedroom at nighttime during sleep and sleeping elevated on pillows while symptoms persist. As discussed, your cough may linger from days to weeks.  If your cough continues to be persistent, but you are generally feeling well, continue over-the-counter cough and cold medications for your symptoms.  Follow-up if you experience wheezing, shortness of breath, difficulty breathing, or other concerns. Follow-up as needed.

## 2023-08-15 NOTE — ED Triage Notes (Signed)
 Pt reports she is fatigued, has a cough, low back pain, sore throat, and headache x 2 weeks

## 2023-08-15 NOTE — ED Provider Notes (Signed)
 [facility]    CSN: [id number] Arrival date & time: 08/15/23  1657      History   Chief Complaint Chief Complaint  Patient presents with   Cough    Going on two weeks of productive coughing, terrible headache, lower back pain and fatigue.  Not getting better. - Entered by patient    HPI Megan Houston is a 33 y.o. female.   The history is provided by the patient.   Patient presents with a 2-week history of cough, sore throat, headache, generalized fatigue, and low back pain.  Patient reports that she believe that she did have a fever initially when symptoms started but that has since improved.  She also endorses intermittent wheezing.  She denies ear pain, ear drainage, shortness of breath, difficulty breathing, chest pain, abdominal pain, nausea, vomiting, diarrhea, or rash.  Patient states that she took ibuprofen  for her symptoms with minimal relief.  States that she is currently breast-feeding.  Past Medical History:  Diagnosis Date   Anxiety    Asthma    Bipolar disorder (HCC)    Depression    Heart murmur    Lipoma of back    MDD (major depressive disorder)    PMDD (premenstrual dysphoric disorder)    Pregnancy induced hypertension    Severe preeclampsia, postpartum condition 05/09/2021    Patient Active Problem List   Diagnosis Date Noted   Oral thrush 02/11/2023   Family history of heart failure 12/13/2022   Palpitations 11/07/2022   Encounter for routine adult physical exam with abnormal findings 08/17/2022   S/P emergency cesarean section 05/11/2021   Severe preeclampsia, postpartum condition 05/09/2021   Rubella non-immune status, antepartum 11/17/2020   Anxiety 12/15/2018   PMDD (premenstrual dysphoric disorder) 09/16/2018    Past Surgical History:  Procedure Laterality Date   CESAREAN SECTION N/A 05/10/2021   Procedure: CESAREAN SECTION;  Surgeon: Kandis Devaughn Sayres, MD;  Location: [facility];  Service: Obstetrics;  Laterality: N/A;    LIPOMA EXCISION N/A 01/01/2020   Procedure: EXCISION of LIPOMA from BACK;  Surgeon: Mavis Anes, MD;  Location: [facility];  Service: General;  Laterality: N/A;    OB History     Gravida  1   Para  1   Term  1   Preterm  0   AB  0   Living  1      SAB  0   IAB  0   Ectopic  0   Multiple  0   Live Births  1            Home Medications    Prior to Admission medications   Medication Sig Start Date End Date Taking? Authorizing Provider  albuterol  (VENTOLIN  HFA) 108 (90 Base) MCG/ACT inhaler Inhale 2 puffs into the lungs every 6 (six) hours as needed for wheezing or shortness of breath. 04/05/23   Del Wilhelmena Lloyd Sola, FNP  b complex vitamins capsule Take 1 capsule by mouth at bedtime.    [provider]  Cholecalciferol (VITAMIN D ) 125 MCG (5000 UT) CAPS Take 1 capsule by mouth at bedtime.    [provider]  Magnesium  250 MG TABS Take 1 tablet by mouth at bedtime. 12/20/20   [provider]  nystatin  (MYCOSTATIN ) 100000 UNIT/ML suspension Use as directed 5 mLs (500,000 Units total) in the mouth or throat 4 (four) times daily. For 7 days 02/11/23   Del Wilhelmena Lloyd Sola, FNP  Prenatal Vit-Fe Fumarate-FA (PRENATAL  MULTIVITAMIN) TABS tablet Take 1 tablet by mouth daily at 12 noon.    [provider]  sertraline  (ZOLOFT ) 25 MG tablet Take 1 tablet (25 mg total) by mouth daily. 11/07/22   Del Wilhelmena Lloyd Sola, FNP    Family History Family History  Problem Relation Age of Onset   Diabetes Mother    Hypertension Mother    Bipolar disorder Mother    Alcohol abuse Mother    Drug abuse Mother    Cancer Mother        cervical   Leukemia Mother    OCD Father    Anxiety disorder Father    Diabetes Father    Hypertension Father    Bipolar disorder Sister    Drug abuse Sister    Alcohol abuse Sister    Diabetes Maternal Grandmother    Bipolar disorder Maternal Grandmother    Anxiety disorder Maternal Grandmother     Schizophrenia Maternal Grandmother    Liver disease Paternal Grandmother    Hypertension Paternal Aunt    Diabetes Paternal Aunt    Hypertension Other    Diabetes Other     Social History Social History   Tobacco Use   Smoking status: Former    Current packs/day: 0.00    Average packs/day: 0.3 packs/day for 4.0 years (1.0 ttl pk-yrs)    Types: Cigarettes    Start date: 05/27/2010    Quit date: 05/27/2014    Years since quitting: 9.2   Smokeless tobacco: Never  Vaping Use   Vaping status: Never Used  Substance Use Topics   Alcohol use: Not Currently   Drug use: No     Allergies   Codeine and Hydrocodone -acetaminophen    Review of Systems Review of Systems Per HPI  Physical Exam Triage Vital Signs ED Triage Vitals  Encounter Vitals Group     BP 08/15/23 1708 (!) 143/83     Girls Systolic BP Percentile --      Girls Diastolic BP Percentile --      Boys Systolic BP Percentile --      Boys Diastolic BP Percentile --      Pulse Rate 08/15/23 1708 81     Resp 08/15/23 1708 20     Temp 08/15/23 1708 98.3 F (36.8 C)     Temp Source 08/15/23 1708 Oral     SpO2 08/15/23 1708 96 %     Weight --      Height --      Head Circumference --      Peak Flow --      Pain Score 08/15/23 1709 0     Pain Loc --      Pain Education --      Exclude from Growth Chart --    No data found.  Updated Vital Signs BP (!) 143/83 (BP Location: Right Arm)   Pulse 81   Temp 98.3 F (36.8 C) (Oral)   Resp 20   LMP 07/25/2023   SpO2 96%   Breastfeeding Yes   Visual Acuity Right Eye Distance:   Left Eye Distance:   Bilateral Distance:    Right Eye Near:   Left Eye Near:    Bilateral Near:     Physical Exam Vitals and nursing note reviewed.  Constitutional:      General: She is not in acute distress.    Appearance: Normal appearance.  HENT:     Head: Normocephalic.     Right Ear: Tympanic membrane, ear canal  and external ear normal.     Left Ear: Tympanic membrane, ear  canal and external ear normal.     Nose: Nose normal.     Mouth/Throat:     Mouth: Mucous membranes are moist.     Pharynx: No posterior oropharyngeal erythema.   Eyes:     Extraocular Movements: Extraocular movements intact.     Pupils: Pupils are equal, round, and reactive to light.    Cardiovascular:     Rate and Rhythm: Normal rate and regular rhythm.     Pulses: Normal pulses.     Heart sounds: Normal heart sounds.  Pulmonary:     Effort: Pulmonary effort is normal. No respiratory distress.     Breath sounds: Normal breath sounds. No stridor. No wheezing, rhonchi or rales.  Abdominal:     Palpations: Abdomen is soft.     Tenderness: There is no abdominal tenderness.   Musculoskeletal:     Cervical back: Normal range of motion.   Skin:    General: Skin is warm and dry.   Neurological:     General: No focal deficit present.     Mental Status: She is alert and oriented to person, place, and time.   Psychiatric:        Mood and Affect: Mood normal.        Behavior: Behavior normal.      UC Treatments / Results  Labs (all labs ordered are listed, but only abnormal results are displayed) Labs Reviewed - No data to display  EKG   Radiology No results found.  Procedures Procedures (including critical care time)  Medications Ordered in UC Medications - No data to display  Initial Impression / Assessment and Plan / UC Course  I have reviewed the triage vital signs and the nursing notes.  Pertinent labs & imaging results that were available during my care of the patient were reviewed by me and considered in my medical decision making (see chart for details).  Patient with symptoms that been present for the past 2 weeks.  On exam, lung sounds are clear throughout, room air sats are at 96%.  Viral testing is not indicated given the duration of the patient's symptoms.  Given the persistence of her symptoms without resolution, will treat for lower respiratory  infection with amoxicillin  500 mg twice daily for the next 7 days.  For the cough, guaifenesin  100 mg was prescribed.  Supportive care recommendations were provided and discussed with the patient to include fluids, rest, continuing over-the-counter analgesics, and use of a humidifier in the bedroom at nighttime during sleep.  Discussed indications with patient regarding follow-up.  Patient was in agreement with this plan of care and verbalizes understanding.  All questions were answered.  Patient stable for discharge.   Final Clinical Impressions(s) / UC Diagnoses   Final diagnoses:  None   Discharge Instructions   None    ED Prescriptions   None    PDMP not reviewed this encounter.   Gilmer Etta PARAS, NP 08/15/23 1736

## 2023-08-19 ENCOUNTER — Encounter: Payer: BC Managed Care – PPO | Admitting: Family Medicine

## 2023-08-21 ENCOUNTER — Ambulatory Visit (INDEPENDENT_AMBULATORY_CARE_PROVIDER_SITE_OTHER)

## 2023-08-21 ENCOUNTER — Encounter: Admitting: Family Medicine

## 2023-08-21 VITALS — BP 123/88 | HR 87 | Ht 65.0 in | Wt 218.1 lb

## 2023-08-21 DIAGNOSIS — E559 Vitamin D deficiency, unspecified: Secondary | ICD-10-CM

## 2023-08-21 DIAGNOSIS — Z0001 Encounter for general adult medical examination with abnormal findings: Secondary | ICD-10-CM

## 2023-08-21 DIAGNOSIS — R350 Frequency of micturition: Secondary | ICD-10-CM | POA: Diagnosis not present

## 2023-08-21 DIAGNOSIS — E66812 Obesity, class 2: Secondary | ICD-10-CM

## 2023-08-21 DIAGNOSIS — Z6836 Body mass index (BMI) 36.0-36.9, adult: Secondary | ICD-10-CM

## 2023-08-21 NOTE — Progress Notes (Signed)
 Complete physical exam  Patient: Megan Houston   DOB: 05/06/1990   33 y.o. Female  MRN: [id number]  Subjective:    Chief Complaint  Patient presents with   Medical Management of Chronic Issues    Annual Physcial    Megan Houston is a 33 y.o. female who presents today for a complete physical exam. She reports consuming a general diet. Home exercise routine includes walking 1 hrs per day and  . She generally feels well. She reports sleeping well. She does have additional problems to discuss today, which includes frequent urination, which she has had most of her life.    Most recent fall risk assessment:    08/21/2023    8:08 AM  Fall Risk   Falls in the past year? 0  Risk for fall due to : No Fall Risks     Most recent depression screenings:    08/21/2023    8:08 AM 11/07/2022    2:57 PM  PHQ 2/9 Scores  PHQ - 2 Score 0 0  PHQ- 9 Score 2 0    Dental: No current dental problems and Receives regular dental care  Patient Active Problem List   Diagnosis Date Noted   Vitamin D  deficiency 08/21/2023   Frequent urination 08/21/2023   Obesity, Class II, BMI 35-39.9 08/21/2023   Oral thrush 02/11/2023   Family history of heart failure 12/13/2022   Palpitations 11/07/2022   Encounter for routine adult physical exam with abnormal findings 08/17/2022   S/P emergency cesarean section 05/11/2021   Severe preeclampsia, postpartum condition 05/09/2021   Rubella non-immune status, antepartum 11/17/2020   Anxiety 12/15/2018   PMDD (premenstrual dysphoric disorder) 09/16/2018      Patient Care Team: Patient, No Pcp Per as PCP - General (General Practice) Mallipeddi, Diannah SQUIBB, MD as PCP - Cardiology (Cardiology)   Outpatient Medications Prior to Visit  Medication Sig   albuterol  (VENTOLIN  HFA) 108 (90 Base) MCG/ACT inhaler Inhale 2 puffs into the lungs every 6 (six) hours as needed for wheezing or shortness of breath.   amoxicillin  (AMOXIL ) 500 MG capsule Take 1 capsule (500  mg total) by mouth 2 (two) times daily for 7 days.   b complex vitamins capsule Take 1 capsule by mouth at bedtime.   Magnesium  250 MG TABS Take 1 tablet by mouth at bedtime.   pantoprazole  (PROTONIX ) 40 MG tablet Take 40 mg by mouth daily.   [DISCONTINUED] Cholecalciferol (VITAMIN D ) 125 MCG (5000 UT) CAPS Take 1 capsule by mouth at bedtime. (Patient not taking: Reported on 08/21/2023)   [DISCONTINUED] guaiFENesin  (ROBITUSSIN) 100 MG/5ML liquid Take 10 mLs by mouth every 6 (six) hours as needed for cough or to loosen phlegm.   [DISCONTINUED] nystatin  (MYCOSTATIN ) 100000 UNIT/ML suspension Use as directed 5 mLs (500,000 Units total) in the mouth or throat 4 (four) times daily. For 7 days   [DISCONTINUED] Prenatal Vit-Fe Fumarate-FA (PRENATAL MULTIVITAMIN) TABS tablet Take 1 tablet by mouth daily at 12 noon. (Patient not taking: Reported on 08/21/2023)   [DISCONTINUED] sertraline  (ZOLOFT ) 25 MG tablet Take 1 tablet (25 mg total) by mouth daily.   No facility-administered medications prior to visit.    ROS     Objective:     BP 123/88   Pulse 87   Ht 5' 5 (1.651 m)   Wt 218 lb 1.9 oz (98.9 kg)   LMP 07/25/2023   SpO2 96%   Breastfeeding Yes   BMI 36.30 kg/m  BP Readings from  Last 3 Encounters:  08/21/23 123/88  08/15/23 (!) 143/83  03/22/23 (!) 140/84   Wt Readings from Last 3 Encounters:  08/21/23 218 lb 1.9 oz (98.9 kg)  03/22/23 238 lb (108 kg)  12/19/22 238 lb (108 kg)     Physical Exam Vitals and nursing note reviewed.  Constitutional:      Appearance: Normal appearance. She is obese.  HENT:     Head: Normocephalic.     Right Ear: Tympanic membrane, ear canal and external ear normal.     Left Ear: Tympanic membrane, ear canal and external ear normal.     Nose: Nose normal.     Mouth/Throat:     Mouth: Mucous membranes are moist.     Pharynx: Oropharynx is clear.  Eyes:     Extraocular Movements: Extraocular movements intact.     Pupils: Pupils are equal, round,  and reactive to light.  Cardiovascular:     Rate and Rhythm: Normal rate and regular rhythm.  Pulmonary:     Effort: Pulmonary effort is normal.     Breath sounds: Normal breath sounds.  Abdominal:     General: Bowel sounds are normal.     Palpations: Abdomen is soft.  Musculoskeletal:        General: Normal range of motion.     Cervical back: Normal range of motion and neck supple.  Skin:    General: Skin is warm and dry.  Neurological:     Mental Status: She is alert and oriented to person, place, and time.  Psychiatric:        Mood and Affect: Mood normal.        Thought Content: Thought content normal.      No results found for any visits on 08/21/23. Last CBC Lab Results  Component Value Date   WBC 10.3 08/20/2022   HGB 14.3 08/20/2022   HCT 44.5 08/20/2022   MCV 86 08/20/2022   MCH 27.7 08/20/2022   RDW 13.2 08/20/2022   PLT 312 08/20/2022   Last metabolic panel Lab Results  Component Value Date   GLUCOSE 85 08/20/2022   NA 140 08/20/2022   K 4.3 08/20/2022   CL 104 08/20/2022   CO2 23 08/20/2022   BUN 18 08/20/2022   CREATININE 0.69 08/20/2022   EGFR 117 08/20/2022   CALCIUM 9.2 08/20/2022   PROT 6.4 08/20/2022   ALBUMIN 4.2 08/20/2022   LABGLOB 2.2 08/20/2022   AGRATIO 1.3 05/05/2021   BILITOT 0.5 08/20/2022   ALKPHOS 127 (H) 08/20/2022   AST 17 08/20/2022   ALT 16 08/20/2022   ANIONGAP 8 04/18/2022   Last lipids Lab Results  Component Value Date   CHOL 162 08/20/2022   HDL 42 08/20/2022   LDLCALC 98 08/20/2022   TRIG 123 08/20/2022   CHOLHDL 3.9 08/20/2022   Last hemoglobin A1c Lab Results  Component Value Date   HGBA1C 5.7 (H) 08/20/2022   Last thyroid  functions Lab Results  Component Value Date   TSH 3.180 08/20/2022   Last vitamin D  Lab Results  Component Value Date   VD25OH 60.6 08/20/2022        Assessment & Plan:    Routine Health Maintenance and Physical Exam  Immunization History  Administered Date(s) Administered    Influenza,inj,Quad PF,6+ Mos 11/15/2020   MMR 05/12/2021   Tdap 03/22/2021    Health Maintenance  Topic Date Due   Hepatitis B Vaccines (1 of 3 - 19+ 3-dose series) Never done   HPV  VACCINES (1 - 3-dose SCDM series) Never done   COVID-19 Vaccine (1 - 2024-25 season) Never done   INFLUENZA VACCINE  09/20/2023   Cervical Cancer Screening (HPV/Pap Cotest)  07/06/2027   DTaP/Tdap/Td (2 - Td or Tdap) 03/23/2031   Hepatitis C Screening  Completed   HIV Screening  Completed   Meningococcal B Vaccine  Aged Out    Discussed health benefits of physical activity, and encouraged her to engage in regular exercise appropriate for her age and condition.  Problem List Items Addressed This Visit       Other   Vitamin D  deficiency   Recheck levels today.  She is not currently taking a supplement reports being outside follow-up according to lab results.      Relevant Orders   Vitamin D  (25 hydroxy)   Frequent urination   She reports she has had this issue for years.  She denies frequent UTIs.  Will refer to urology for further evaluation.      Relevant Orders   Ambulatory referral to Urology   Obesity, Class II, BMI 35-39.9   She has lost 20 pounds with diet and exercise.  Applauded her weight loss and encouraged to continue      Other Visit Diagnoses       Encounter for preventative adult health care exam with abnormal findings    -  Primary   Unremarkable exam in office today.  Will check fasting labs for further evaluation.  Follow-up according to lab results.   Relevant Orders   CMP14+EGFR   Lipid Profile   TSH + free T4   HgB A1c   CBC      No follow-ups on file.     Leita Longs, FNP

## 2023-08-21 NOTE — Assessment & Plan Note (Signed)
 Recheck levels today.  She is not currently taking a supplement reports being outside follow-up according to lab results.

## 2023-08-21 NOTE — Assessment & Plan Note (Signed)
 She has lost 20 pounds with diet and exercise.  Applauded her weight loss and encouraged to continue

## 2023-08-21 NOTE — Assessment & Plan Note (Signed)
 She reports she has had this issue for years.  She denies frequent UTIs.  Will refer to urology for further evaluation.

## 2023-08-22 ENCOUNTER — Encounter: Payer: Self-pay | Admitting: Obstetrics & Gynecology

## 2023-08-22 ENCOUNTER — Ambulatory Visit (INDEPENDENT_AMBULATORY_CARE_PROVIDER_SITE_OTHER): Admitting: Obstetrics & Gynecology

## 2023-08-22 ENCOUNTER — Encounter: Payer: BC Managed Care – PPO | Admitting: Family Medicine

## 2023-08-22 VITALS — BP 118/74 | HR 83 | Ht 65.0 in | Wt 218.0 lb

## 2023-08-22 DIAGNOSIS — Z01419 Encounter for gynecological examination (general) (routine) without abnormal findings: Secondary | ICD-10-CM

## 2023-08-22 LAB — CBC
Hematocrit: 45.1 % (ref 34.0–46.6)
Hemoglobin: 14.4 g/dL (ref 11.1–15.9)
MCH: 27.3 pg (ref 26.6–33.0)
MCHC: 31.9 g/dL (ref 31.5–35.7)
MCV: 85 fL (ref 79–97)
Platelets: 318 10*3/uL (ref 150–450)
RBC: 5.28 x10E6/uL (ref 3.77–5.28)
RDW: 13.1 % (ref 11.7–15.4)
WBC: 11.8 10*3/uL — ABNORMAL HIGH (ref 3.4–10.8)

## 2023-08-22 LAB — CMP14+EGFR
ALT: 14 IU/L (ref 0–32)
AST: 14 IU/L (ref 0–40)
Albumin: 4.2 g/dL (ref 3.9–4.9)
Alkaline Phosphatase: 105 IU/L (ref 44–121)
BUN/Creatinine Ratio: 20 (ref 9–23)
BUN: 15 mg/dL (ref 6–20)
Bilirubin Total: 0.8 mg/dL (ref 0.0–1.2)
CO2: 21 mmol/L (ref 20–29)
Calcium: 9.4 mg/dL (ref 8.7–10.2)
Chloride: 104 mmol/L (ref 96–106)
Creatinine, Ser: 0.76 mg/dL (ref 0.57–1.00)
Globulin, Total: 2.7 g/dL (ref 1.5–4.5)
Glucose: 83 mg/dL (ref 70–99)
Potassium: 4.5 mmol/L (ref 3.5–5.2)
Sodium: 140 mmol/L (ref 134–144)
Total Protein: 6.9 g/dL (ref 6.0–8.5)
eGFR: 105 mL/min/{1.73_m2} (ref >59)

## 2023-08-22 LAB — HEMOGLOBIN A1C
Est. average glucose Bld gHb Est-mCnc: 117 mg/dL
Hgb A1c MFr Bld: 5.7 % — ABNORMAL HIGH (ref 4.8–5.6)

## 2023-08-22 LAB — VITAMIN D 25 HYDROXY (VIT D DEFICIENCY, FRACTURES): Vit D, 25-Hydroxy: 51 ng/mL (ref 30.0–100.0)

## 2023-08-22 LAB — LIPID PANEL
Chol/HDL Ratio: 3.9 ratio (ref 0.0–4.4)
Cholesterol, Total: 150 mg/dL (ref 100–199)
HDL: 38 mg/dL — ABNORMAL LOW (ref >39)
LDL Chol Calc (NIH): 85 mg/dL (ref 0–99)
Triglycerides: 155 mg/dL — ABNORMAL HIGH (ref 0–149)
VLDL Cholesterol Cal: 27 mg/dL (ref 5–40)

## 2023-08-22 LAB — TSH+FREE T4
Free T4: 1.02 ng/dL (ref 0.82–1.77)
TSH: 2.2 u[IU]/mL (ref 0.450–4.500)

## 2023-08-22 NOTE — Progress Notes (Signed)
 WELL-WOMAN EXAMINATION Patient name: Megan Houston MRN [id number]  Date of birth: 09/10/1990 Chief Complaint:   Annual Exam  History of Present Illness:   Megan Houston is a 33 y.o. G39P1001 female being seen today for a routine well-woman exam.   Menses every 35 days- no HMB. Tolerable dysmenorrhea.  Denies vaginal discharge, itching, irritation.  Denies pelvic or abdominal pain outside of her menses.  She reports no acute GYN concerns  Her only concern is her risk of cervical cancer as her mother had it x 2.  Patient has had 2 prior normal Pap/HPV testing- last 2024.  Sexually active with just her husband.    Patient's last menstrual period was 07/29/2023. Denies issues with her menses The current method of family planning is pull out  Last pap 06/2022.  Last mammogram: NA. Last colonoscopy: NA     08/22/2023    8:34 AM 08/21/2023    8:08 AM 11/07/2022    2:57 PM 08/17/2022    3:11 PM 08/17/2022    3:00 PM  Depression screen PHQ 2/9  Decreased Interest 0 0 0 0 0  Down, Depressed, Hopeless 0 0 0 0 0  PHQ - 2 Score 0 0 0 0 0  Altered sleeping 0 0 0 0 0  Tired, decreased energy 1 1 0 0 0  Change in appetite 0 0 0 0 0  Feeling bad or failure about yourself  0 0 0 0 0  Trouble concentrating 0 1 0 3 0  Moving slowly or fidgety/restless 0 0 0 0 0  Suicidal thoughts 0 0 0 0 0  PHQ-9 Score 1 2 0 3 0  Difficult doing work/chores  Somewhat difficult Not difficult at all Somewhat difficult Not difficult at all      Review of Systems:   Pertinent items are noted in HPI Denies any headaches, blurred vision, fatigue, shortness of breath, chest pain, abdominal pain, bowel movements, urination, or intercourse unless otherwise stated above.  Pertinent History Reviewed:  Reviewed past medical,surgical, social and family history.  Reviewed problem list, medications and allergies. Physical Assessment:   Vitals:   08/22/23 0842  BP: 118/74  Pulse: 83  Weight: 218 lb (98.9 kg)   Height: 5' 5 (1.651 m)  Body mass index is 36.28 kg/m.        Physical Examination:   General appearance - well appearing, and in no distress  Mental status - alert, oriented to person, place, and time  Psych:  She has a normal mood and affect  Skin - warm and dry, normal color, no suspicious lesions noted  Chest - effort normal, all lung fields clear to auscultation bilaterally  Heart - normal rate and regular rhythm  Neck:  midline trachea, no thyromegaly or nodules  Breasts - breasts appear normal, no suspicious masses, no skin or nipple changes or  axillary nodes  Abdomen - soft, nontender, nondistended, no masses or organomegaly  Pelvic - VULVA: normal appearing vulva with no masses, tenderness or lesions  VAGINA: normal appearing vagina with normal color and discharge, no lesions  CERVIX: normal appearing cervix without discharge or lesions, no CMT  UTERUS: uterus is felt to be normal size, shape, consistency and nontender   ADNEXA: No adnexal masses or tenderness noted.  Extremities:  No swelling or varicosities noted  Chaperone: pt declined     Assessment & Plan:  1) Well-Woman Exam -Reviewed ASCCP guidelines and discussed HPV and correlation with cervical cancer - Reassured patient  that based on her clinical history her likelihood of cervical cancer is incredibly low - Recommendation to follow guidelines with Pap smear in 2027  2) Family planning - Not pursuing a pregnancy at this time -previously needed letrozole  for conception  No orders of the defined types were placed in this encounter.   Meds: No orders of the defined types were placed in this encounter.   Follow-up: Return in about 1 year (around 08/21/2024) for Annual.   Joeseph Verville, DO Attending Obstetrician & Gynecologist, [facility] for [facility], [facility]

## 2023-08-29 ENCOUNTER — Ambulatory Visit (INDEPENDENT_AMBULATORY_CARE_PROVIDER_SITE_OTHER): Payer: Self-pay

## 2023-09-06 DIAGNOSIS — F411 Generalized anxiety disorder: Secondary | ICD-10-CM | POA: Diagnosis not present

## 2023-09-10 ENCOUNTER — Other Ambulatory Visit: Payer: Self-pay

## 2023-09-10 DIAGNOSIS — F419 Anxiety disorder, unspecified: Secondary | ICD-10-CM

## 2023-09-10 MED ORDER — CLONAZEPAM 0.5 MG PO TABS: 0.5000 mg | ORAL_TABLET | Freq: Every day | ORAL | 0 refills | Status: DC | PRN

## 2023-09-27 DIAGNOSIS — F411 Generalized anxiety disorder: Secondary | ICD-10-CM | POA: Diagnosis not present

## 2023-10-04 ENCOUNTER — Ambulatory Visit: Admitting: Urology

## 2023-10-11 DIAGNOSIS — F411 Generalized anxiety disorder: Secondary | ICD-10-CM | POA: Diagnosis not present

## 2023-10-21 ENCOUNTER — Ambulatory Visit
Admission: RE | Admit: 2023-10-21 | Discharge: 2023-10-21 | Disposition: A | Discharge status: Home / Self Care | Attending: Family Medicine | Admitting: Family Medicine

## 2023-10-21 VITALS — BP 126/88 | HR 69 | Temp 98.9°F | Resp 20

## 2023-10-21 DIAGNOSIS — Z20818 Contact with and (suspected) exposure to other bacterial communicable diseases: Secondary | ICD-10-CM | POA: Diagnosis not present

## 2023-10-21 DIAGNOSIS — J039 Acute tonsillitis, unspecified: Secondary | ICD-10-CM | POA: Diagnosis not present

## 2023-10-21 LAB — POCT RAPID STREP A (OFFICE): Rapid Strep A Screen: NEGATIVE

## 2023-10-21 MED ORDER — AMOXICILLIN 875 MG PO TABS: 875.0000 mg | ORAL_TABLET | Freq: Two times a day (BID) | ORAL | 0 refills | Status: DC

## 2023-10-21 MED ORDER — LIDOCAINE VISCOUS HCL 2 % MT SOLN: 10.0000 mL | OROMUCOSAL | 0 refills | Status: DC | PRN

## 2023-10-21 NOTE — ED Triage Notes (Signed)
 Pt reports sore throat, swollen tonsils, pus pockets and headache since yesterday,exposure to strep recently.

## 2023-10-21 NOTE — Discharge Instructions (Signed)
 Your strep test was negative today, but given your symptoms and recent exposures will cover for possible strep throat.  I have also sent in some numbing liquid to help with pain in addition to the over-the-counter pain relievers and throat sprays.  Change out your toothbrush after about 2 days of the medication and follow-up for worsening or unresolving symptoms

## 2023-10-21 NOTE — ED Provider Notes (Signed)
 [facility]    CSN: [id number] Arrival date & time: 10/21/23  0956      History   Chief Complaint Chief Complaint  Patient presents with   Sore Throat    Pus covered back left tonsils.  I thought it was a tonsil stone scraped at it & uncovered red swollen irratated tonsil. Also woke up with a bad headache. - Entered by patient    HPI Megan Houston is a 33 y.o. female.   Patient presenting today with 1 day history of sore swollen feeling throat, pus pockets on the tonsils, headache, fatigue.  Denies fever, congestion, cough, chest pain, shortness of breath, abdominal pain, vomiting, diarrhea.  Recent strep exposure per patient.  So far not trying anything over-the-counter for symptoms.    Past Medical History:  Diagnosis Date   Anxiety    Asthma    Bipolar disorder (HCC)    Depression    Heart murmur    Lipoma of back    MDD (major depressive disorder)    PMDD (premenstrual dysphoric disorder)    Pregnancy induced hypertension    Severe preeclampsia, postpartum condition 05/09/2021    Patient Active Problem List   Diagnosis Date Noted   Vitamin D  deficiency 08/21/2023   Frequent urination 08/21/2023   Obesity, Class II, BMI 35-39.9 08/21/2023   Oral thrush 02/11/2023   Family history of heart failure 12/13/2022   Palpitations 11/07/2022   Encounter for routine adult physical exam with abnormal findings 08/17/2022   S/P emergency cesarean section 05/11/2021   Severe preeclampsia, postpartum condition 05/09/2021   Rubella non-immune status, antepartum 11/17/2020   Anxiety 12/15/2018   PMDD (premenstrual dysphoric disorder) 09/16/2018    Past Surgical History:  Procedure Laterality Date   CESAREAN SECTION N/A 05/10/2021   Procedure: CESAREAN SECTION;  Surgeon: Kandis Devaughn Sayres, MD;  Location: [facility];  Service: Obstetrics;  Laterality: N/A;   LIPOMA EXCISION N/A 01/01/2020   Procedure: EXCISION of LIPOMA from BACK;  Surgeon: Mavis Anes, MD;   Location: [facility];  Service: General;  Laterality: N/A;    OB History     Gravida  1   Para  1   Term  1   Preterm  0   AB  0   Living  1      SAB  0   IAB  0   Ectopic  0   Multiple  0   Live Births  1            Home Medications    Prior to Admission medications   Medication Sig Start Date End Date Taking? Authorizing Provider  amoxicillin  (AMOXIL ) 875 MG tablet Take 1 tablet (875 mg total) by mouth 2 (two) times daily. 10/21/23  Yes Stuart Vernell Norris, PA-C  lidocaine  (XYLOCAINE ) 2 % solution Use as directed 10 mLs in the mouth or throat every 3 (three) hours as needed. 10/21/23  Yes Stuart Vernell Norris, PA-C  albuterol  (VENTOLIN  HFA) 108 [phone] Base) MCG/ACT inhaler Inhale 2 puffs into the lungs every 6 (six) hours as needed for wheezing or shortness of breath. 04/05/23   Del Wilhelmena Lloyd Sola, FNP  b complex vitamins capsule Take 1 capsule by mouth at bedtime.    [provider]  clonazePAM  (KLONOPIN ) 0.5 MG tablet Take 1 tablet (0.5 mg total) by mouth daily as needed for anxiety. 09/10/23   Bevely Doffing, FNP  Magnesium  250 MG TABS Take 1 tablet by mouth at bedtime. 12/20/20  [provider]  pantoprazole  (PROTONIX ) 40 MG tablet Take 40 mg by mouth daily.    [provider]    Family History Family History  Problem Relation Age of Onset   Diabetes Mother    Hypertension Mother    Bipolar disorder Mother    Alcohol abuse Mother    Drug abuse Mother    Cancer Mother        cervical   Leukemia Mother    OCD Father    Anxiety disorder Father    Diabetes Father    Hypertension Father    Bipolar disorder Sister    Drug abuse Sister    Alcohol abuse Sister    Diabetes Maternal Grandmother    Bipolar disorder Maternal Grandmother    Anxiety disorder Maternal Grandmother    Schizophrenia Maternal Grandmother    Liver disease Paternal Grandmother    Hypertension Paternal Aunt    Diabetes Paternal Aunt    Hypertension  Other    Diabetes Other     Social History Social History   Tobacco Use   Smoking status: Former    Current packs/day: 0.00    Average packs/day: 0.3 packs/day for 4.0 years (1.0 ttl pk-yrs)    Types: Cigarettes    Start date: 05/27/2010    Quit date: 05/27/2014    Years since quitting: 9.4   Smokeless tobacco: Never  Vaping Use   Vaping status: Never Used  Substance Use Topics   Alcohol use: Not Currently   Drug use: No     Allergies   Codeine and Hydrocodone -acetaminophen    Review of Systems Review of Systems Per HPI  Physical Exam Triage Vital Signs ED Triage Vitals  Encounter Vitals Group     BP 10/21/23 1043 126/88     Girls Systolic BP Percentile --      Girls Diastolic BP Percentile --      Boys Systolic BP Percentile --      Boys Diastolic BP Percentile --      Pulse Rate 10/21/23 1043 69     Resp 10/21/23 1043 20     Temp 10/21/23 1043 98.9 F (37.2 C)     Temp Source 10/21/23 1043 Oral     SpO2 10/21/23 1043 100 %     Weight --      Height --      Head Circumference --      Peak Flow --      Pain Score 10/21/23 1046 5     Pain Loc --      Pain Education --      Exclude from Growth Chart --    No data found.  Updated Vital Signs BP 126/88 (BP Location: Right Arm)   Pulse 69   Temp 98.9 F (37.2 C) (Oral)   Resp 20   LMP 10/02/2023 (Within Days)   SpO2 100%   Breastfeeding No   Visual Acuity Right Eye Distance:   Left Eye Distance:   Bilateral Distance:    Right Eye Near:   Left Eye Near:    Bilateral Near:     Physical Exam Vitals and nursing note reviewed.  Constitutional:      Appearance: Normal appearance. She is not ill-appearing.  HENT:     Head: Atraumatic.     Right Ear: Tympanic membrane normal.     Left Ear: Tympanic membrane normal.     Nose: Nose normal.     Mouth/Throat:     Mouth: Mucous  membranes are moist.     Pharynx: Oropharyngeal exudate and posterior oropharyngeal erythema present.  Eyes:     Extraocular  Movements: Extraocular movements intact.     Conjunctiva/sclera: Conjunctivae normal.  Cardiovascular:     Rate and Rhythm: Normal rate and regular rhythm.     Heart sounds: Normal heart sounds.  Pulmonary:     Effort: Pulmonary effort is normal.     Breath sounds: Normal breath sounds.  Musculoskeletal:        General: Normal range of motion.     Cervical back: Normal range of motion and neck supple.  Lymphadenopathy:     Cervical: Cervical adenopathy present.  Skin:    General: Skin is warm and dry.  Neurological:     Mental Status: She is alert and oriented to person, place, and time.  Psychiatric:        Mood and Affect: Mood normal.        Thought Content: Thought content normal.        Judgment: Judgment normal.      UC Treatments / Results  Labs (all labs ordered are listed, but only abnormal results are displayed) Labs Reviewed  POCT RAPID STREP A (OFFICE)    EKG   Radiology No results found.  Procedures Procedures (including critical care time)  Medications Ordered in UC Medications - No data to display  Initial Impression / Assessment and Plan / UC Course  I have reviewed the triage vital signs and the nursing notes.  Pertinent labs & imaging results that were available during my care of the patient were reviewed by me and considered in my medical decision making (see chart for details).     Rapid strep negative, vitals within normal limits but given exam findings and recent strep exposure will cover with amoxicillin , viscous lidocaine .  Discussed supportive over-the-counter medications, home care and return precautions.  Final Clinical Impressions(s) / UC Diagnoses   Final diagnoses:  Acute tonsillitis, unspecified etiology  Exposure to strep throat     Discharge Instructions      Your strep test was negative today, but given your symptoms and recent exposures will cover for possible strep throat.  I have also sent in some numbing liquid to  help with pain in addition to the over-the-counter pain relievers and throat sprays.  Change out your toothbrush after about 2 days of the medication and follow-up for worsening or unresolving symptoms    ED Prescriptions     Medication Sig Dispense Auth. Provider   amoxicillin  (AMOXIL ) 875 MG tablet Take 1 tablet (875 mg total) by mouth 2 (two) times daily. 20 tablet Stuart Vernell Norris, PA-C   lidocaine  (XYLOCAINE ) 2 % solution Use as directed 10 mLs in the mouth or throat every 3 (three) hours as needed. 100 mL Stuart Vernell Norris, [place]      PDMP not reviewed this encounter.   Stuart Vernell Norris, [place] 10/21/23 [phone]

## 2023-10-25 DIAGNOSIS — F411 Generalized anxiety disorder: Secondary | ICD-10-CM | POA: Diagnosis not present

## 2023-11-08 ENCOUNTER — Ambulatory Visit (INDEPENDENT_AMBULATORY_CARE_PROVIDER_SITE_OTHER): Admitting: Urology

## 2023-11-08 ENCOUNTER — Encounter: Payer: Self-pay | Admitting: Urology

## 2023-11-08 ENCOUNTER — Ambulatory Visit (HOSPITAL_COMMUNITY)
Admission: RE | Admit: 2023-11-08 | Discharge: 2023-11-08 | Disposition: A | Discharge status: Home / Self Care | Source: Ambulatory Visit | Attending: Urology | Admitting: Urology

## 2023-11-08 VITALS — BP 108/73 | HR 90

## 2023-11-08 DIAGNOSIS — R350 Frequency of micturition: Secondary | ICD-10-CM | POA: Diagnosis not present

## 2023-11-08 DIAGNOSIS — K5909 Other constipation: Secondary | ICD-10-CM | POA: Insufficient documentation

## 2023-11-08 DIAGNOSIS — K5900 Constipation, unspecified: Secondary | ICD-10-CM | POA: Diagnosis not present

## 2023-11-08 LAB — URINALYSIS, ROUTINE W REFLEX MICROSCOPIC
Bilirubin, UA: NEGATIVE
Glucose, UA: NEGATIVE
Ketones, UA: NEGATIVE
Leukocytes,UA: NEGATIVE
Nitrite, UA: NEGATIVE
Protein,UA: NEGATIVE
RBC, UA: NEGATIVE
Specific Gravity, UA: 1.03 (ref 1.005–1.030)
Urobilinogen, Ur: 0.2 mg/dL (ref 0.2–1.0)
pH, UA: 6 (ref 5.0–7.5)

## 2023-11-08 LAB — BLADDER SCAN AMB NON-IMAGING: Scan Result: 2

## 2023-11-08 NOTE — Progress Notes (Signed)
 11/08/2023 12:01 PM   Megan Houston [birth year] [id number]  Referring provider: Bevely Doffing, FNP [phone] [place],  [place] [address]  Urinary frequency   HPI: Ms Megan Houston is a 32yo here for evaluation for urinary frequency and nocturia. She has had an issues with frequent urination since she was school age. She also had nocturia 2-4x per night. She denies issues with constipation. She urinates 3-4x per hour. PVR 2cc. She has 1 child. Her urination did not worsen after pregnancy.She has intermittent suprapubic pain.   PMH: Past Medical History:  Diagnosis Date   Anxiety    Asthma    Bipolar disorder (HCC)    Depression    Heart murmur    Lipoma of back    MDD (major depressive disorder)    PMDD (premenstrual dysphoric disorder)    Pregnancy induced hypertension    Severe preeclampsia, postpartum condition 05/09/2021    Surgical History: Past Surgical History:  Procedure Laterality Date   CESAREAN SECTION N/A 05/10/2021   Procedure: CESAREAN SECTION;  Surgeon: Kandis Devaughn Sayres, MD;  Location: [facility];  Service: Obstetrics;  Laterality: N/A;   LIPOMA EXCISION N/A 01/01/2020   Procedure: EXCISION of LIPOMA from BACK;  Surgeon: Mavis Anes, MD;  Location: [facility];  Service: General;  Laterality: N/A;    Home Medications:  Allergies as of 11/08/2023       Reactions   Codeine Nausea And Vomiting   Hydrocodone -acetaminophen  Nausea And Vomiting        Medication List        Accurate as of November 08, 2023 12:01 PM. If you have any questions, ask your nurse or doctor.          albuterol  108 (90 Base) MCG/ACT inhaler Commonly known as: VENTOLIN  HFA Inhale 2 puffs into the lungs every 6 (six) hours as needed for wheezing or shortness of breath.   amoxicillin  875 MG tablet Commonly known as: AMOXIL  Take 1 tablet (875 mg total) by mouth 2 (two) times daily.   b complex vitamins capsule Take 1 capsule by mouth at bedtime.   clonazePAM   0.5 MG tablet Commonly known as: KLONOPIN  Take 1 tablet (0.5 mg total) by mouth daily as needed for anxiety.   lidocaine  2 % solution Commonly known as: XYLOCAINE  Use as directed 10 mLs in the mouth or throat every 3 (three) hours as needed.   Magnesium  250 MG Tabs Take 1 tablet by mouth at bedtime.   pantoprazole  40 MG tablet Commonly known as: PROTONIX  Take 40 mg by mouth daily.        Allergies:  Allergies  Allergen Reactions   Codeine Nausea And Vomiting   Hydrocodone -Acetaminophen  Nausea And Vomiting    Family History: Family History  Problem Relation Age of Onset   Diabetes Mother    Hypertension Mother    Bipolar disorder Mother    Alcohol abuse Mother    Drug abuse Mother    Cancer Mother        cervical   Leukemia Mother    OCD Father    Anxiety disorder Father    Diabetes Father    Hypertension Father    Bipolar disorder Sister    Drug abuse Sister    Alcohol abuse Sister    Diabetes Maternal Grandmother    Bipolar disorder Maternal Grandmother    Anxiety disorder Maternal Grandmother    Schizophrenia Maternal Grandmother    Liver disease Paternal Grandmother    Hypertension  Paternal Aunt    Diabetes Paternal Aunt    Hypertension Other    Diabetes Other     Social History:  reports that she quit smoking about 9 years ago. Her smoking use included cigarettes. She started smoking about 13 years ago. She has a 1 pack-year smoking history. She has never used smokeless tobacco. She reports that she does not currently use alcohol. She reports that she does not use drugs.  ROS: All other review of systems were reviewed and are negative except what is noted above in HPI  Physical Exam: BP 108/73   Pulse 90   LMP 10/02/2023 (Within Days)   Constitutional:  Alert and oriented, No acute distress. HEENT: [place] AT, moist mucus membranes.  Trachea midline, no masses. Cardiovascular: No clubbing, cyanosis, or edema. Respiratory: Normal respiratory effort, no  increased work of breathing. GI: Abdomen is soft, nontender, nondistended, no abdominal masses GU: No CVA tenderness.  Lymph: No cervical or inguinal lymphadenopathy. Skin: No rashes, bruises or suspicious lesions. Neurologic: Grossly intact, no focal deficits, moving all 4 extremities. Psychiatric: Normal mood and affect.  Laboratory Data: Lab Results  Component Value Date   WBC 11.8 (H) 08/21/2023   HGB 14.4 08/21/2023   HCT 45.1 08/21/2023   MCV 85 08/21/2023   PLT 318 08/21/2023    Lab Results  Component Value Date   CREATININE 0.76 08/21/2023    No results found for: PSA  No results found for: TESTOSTERONE  Lab Results  Component Value Date   HGBA1C 5.7 (H) 08/21/2023    Urinalysis    Component Value Date/Time   COLORURINE COLORLESS (A) 05/14/2021 1250   APPEARANCEUR CLEAR 05/14/2021 1250   LABSPEC 1.002 (L) 05/14/2021 1250   PHURINE 8.0 05/14/2021 1250   GLUCOSEU Negative 07/30/2022 1639   GLUCOSEU NEGATIVE 05/14/2021 1250   HGBUR MODERATE (A) 05/14/2021 1250   BILIRUBINUR negative 03/01/2023 1452   KETONESUR negative 03/01/2023 1452   KETONESUR NEGATIVE 05/14/2021 1250   PROTEINUR negative 03/01/2023 1452   PROTEINUR NEGATIVE 05/14/2021 1250   UROBILINOGEN 0.2 03/01/2023 1452   NITRITE Negative 03/01/2023 1452   NITRITE negative 07/30/2022 1639   NITRITE NEGATIVE 05/14/2021 1250   LEUKOCYTESUR Negative 03/01/2023 1452   LEUKOCYTESUR NEGATIVE 05/14/2021 1250    Lab Results  Component Value Date   BACTERIA NONE SEEN 05/14/2021    Pertinent Imaging:  No results found for this or any previous visit.  No results found for this or any previous visit.  No results found for this or any previous visit.  No results found for this or any previous visit.  No results found for this or any previous visit.  No results found for this or any previous visit.  No results found for this or any previous visit.  No results found for this or any  previous visit.   Assessment & Plan:    1. Urinary frequency (Primary) KUB, will call with results -if the KUB does not show constipation then we will proceed with mirabegron/gemtesa - Urinalysis, Routine w reflex microscopic - BLADDER SCAN AMB NON-IMAGING   No follow-ups on file.  [name], MD  [facility] [place]

## 2023-11-08 NOTE — Progress Notes (Signed)
 Bladder Scan completed today.  Patient can void prior to the bladder scan. Bladder scan result: 2  Performed By: [facility] LPN

## 2023-11-12 ENCOUNTER — Other Ambulatory Visit: Payer: Self-pay

## 2023-11-12 ENCOUNTER — Encounter: Payer: Self-pay | Admitting: Urology

## 2023-11-12 MED ORDER — MIRABEGRON ER 25 MG PO TB24: 25.0000 mg | ORAL_TABLET | Freq: Every day | ORAL | 3 refills | Status: DC

## 2023-11-27 ENCOUNTER — Ambulatory Visit
Admission: RE | Admit: 2023-11-27 | Discharge: 2023-11-27 | Disposition: A | Discharge status: Home / Self Care | Attending: Family Medicine | Admitting: Family Medicine

## 2023-11-27 VITALS — BP 132/84 | HR 73 | Temp 98.2°F | Resp 20

## 2023-11-27 DIAGNOSIS — J029 Acute pharyngitis, unspecified: Secondary | ICD-10-CM | POA: Diagnosis not present

## 2023-11-27 LAB — POCT MONO SCREEN (KUC): Mono, POC: NEGATIVE

## 2023-11-27 LAB — POCT RAPID STREP A (OFFICE): Rapid Strep A Screen: NEGATIVE

## 2023-11-27 LAB — POC SOFIA SARS ANTIGEN FIA: SARS Coronavirus 2 Ag: NEGATIVE

## 2023-11-27 MED ORDER — PREDNISONE 20 MG PO TABS: 40.0000 mg | ORAL_TABLET | Freq: Every day | ORAL | 0 refills | Status: DC

## 2023-11-27 NOTE — ED Triage Notes (Signed)
 Pt reports sore throat swollen tonsils, recently had strep. Pus heaviness in the chest.

## 2023-11-27 NOTE — Discharge Instructions (Addendum)
 Results for orders placed or performed during the hospital encounter of 11/27/23  POCT rapid strep A   Collection Time: 11/27/23  5:30 PM  Result Value Ref Range   Rapid Strep A Screen Negative Negative  POC SARS Coronavirus 2 Ag   Collection Time: 11/27/23  5:30 PM  Result Value Ref Range   SARS Coronavirus 2 Ag Negative Negative  POCT mono screen   Collection Time: 11/27/23  6:49 PM  Result Value Ref Range   Mono, POC Negative Negative   You may use over the counter ibuprofen  or acetaminophen  as needed.  For a sore throat, over the counter products such as [employer] Peroxyl Mouth Sore Rinse or Chloraseptic Sore Throat Spray may provide some temporary relief. Your rapid strep test was negative today. We have sent your throat swab for culture and will let you know of any positive results.

## 2023-11-28 NOTE — ED Provider Notes (Signed)
 [facility]   [id number] 11/27/23 Arrival Time: 1656  ASSESSMENT & PLAN:  1. Sore throat    No signs of peritonsillar abscess. Discussed.  Trial of: Meds ordered this encounter  Medications   predniSONE  (DELTASONE ) 20 MG tablet    Sig: Take 2 tablets (40 mg total) by mouth daily.    Dispense:  10 tablet    Refill:  0   ENT if not improving.  Results for orders placed or performed during the hospital encounter of 11/27/23  POCT rapid strep A   Collection Time: 11/27/23  5:30 PM  Result Value Ref Range   Rapid Strep A Screen Negative Negative  POC SARS Coronavirus 2 Ag   Collection Time: 11/27/23  5:30 PM  Result Value Ref Range   SARS Coronavirus 2 Ag Negative Negative  POCT mono screen   Collection Time: 11/27/23  6:49 PM  Result Value Ref Range   Mono, POC Negative Negative   Throat culture pending.    Discharge Instructions       Results for orders placed or performed during the hospital encounter of 11/27/23  POCT rapid strep A   Collection Time: 11/27/23  5:30 PM  Result Value Ref Range   Rapid Strep A Screen Negative Negative  POC SARS Coronavirus 2 Ag   Collection Time: 11/27/23  5:30 PM  Result Value Ref Range   SARS Coronavirus 2 Ag Negative Negative  POCT mono screen   Collection Time: 11/27/23  6:49 PM  Result Value Ref Range   Mono, POC Negative Negative   You may use over the counter ibuprofen  or acetaminophen  as needed.  For a sore throat, over the counter products such as [employer] Peroxyl Mouth Sore Rinse or Chloraseptic Sore Throat Spray may provide some temporary relief. Your rapid strep test was negative today. We have sent your throat swab for culture and will let you know of any positive results.      Reviewed expectations re: course of current medical issues. Questions answered. Outlined signs and symptoms indicating need for more acute intervention. Patient verbalized understanding. After Visit Summary  given.   SUBJECTIVE:  Megan Houston is a 33 y.o. female who reports a sore throat. Noted approx 2 weeks ago. Feels L tonsil is swollen. Denies fever. Normal PO intake. No tx PTA.  OBJECTIVE:  Vitals:   11/27/23 1716  BP: 132/84  Pulse: 73  Resp: 20  Temp: 98.2 F (36.8 C)  TempSrc: Oral  SpO2: 98%     General appearance: alert; no distress HEENT: throat with moderate erythema and cobblestoning; uvula is midline Neck: supple with FROM; no lymphadenopathy Lungs: speaks full sentences without difficulty; unlabored Abd: soft; non-tender Skin: reveals no rash; warm and dry Psychological: alert and cooperative; normal mood and affect  Allergies  Allergen Reactions   Codeine Nausea And Vomiting   Hydrocodone -Acetaminophen  Nausea And Vomiting    Past Medical History:  Diagnosis Date   Anxiety    Asthma    Bipolar disorder (HCC)    Depression    Heart murmur    Lipoma of back    MDD (major depressive disorder)    PMDD (premenstrual dysphoric disorder)    Pregnancy induced hypertension    Severe preeclampsia, postpartum condition 05/09/2021   Social History   Socioeconomic History   Marital status: Married    Spouse name: Not on file   Number of children: 0   Years of education: Not on file   Highest education level:  Some college, no degree  Occupational History   Not on file  Tobacco Use   Smoking status: Former    Current packs/day: 0.00    Average packs/day: 0.3 packs/day for 4.0 years (1.0 ttl pk-yrs)    Types: Cigarettes    Start date: 05/27/2010    Quit date: 05/27/2014    Years since quitting: 9.5   Smokeless tobacco: Never  Vaping Use   Vaping status: Never Used  Substance and Sexual Activity   Alcohol use: Not Currently   Drug use: No   Sexual activity: Yes    Birth control/protection: None  Other Topics Concern   Not on file  Social History Narrative   Not on file   Social Drivers of Health   Financial Resource Strain: Low Risk  (08/20/2023)    Overall Financial Resource Strain (CARDIA)    Difficulty of Paying Living Expenses: Not very hard  Food Insecurity: No Food Insecurity (08/22/2023)   Hunger Vital Sign    Worried About Running Out of Food in the Last Year: Never true    Ran Out of Food in the Last Year: Never true  Transportation Needs: No Transportation Needs (08/20/2023)   PRAPARE - Administrator, Civil Service (Medical): No    Lack of Transportation (Non-Medical): No  Physical Activity: Sufficiently Active (08/20/2023)   Exercise Vital Sign    Days of Exercise per Week: 5 days    Minutes of Exercise per Session: 50 min  Stress: No Stress Concern Present (08/20/2023)   [employer] of Occupational Health - Occupational Stress Questionnaire    Feeling of Stress: Not at all  Social Connections: Moderately Isolated (08/20/2023)   Social Connection and Isolation Panel    Frequency of Communication with Friends and Family: Three times a week    Frequency of Social Gatherings with Friends and Family: Never    Attends Religious Services: Never    Database administrator or Organizations: No    Attends Engineer, structural: Not on file    Marital Status: Married  Catering manager Violence: Not At Risk (08/22/2023)   Humiliation, Afraid, Rape, and Kick questionnaire    Fear of Current or Ex-Partner: No    Emotionally Abused: No    Physically Abused: No    Sexually Abused: No   Family History  Problem Relation Age of Onset   Diabetes Mother    Hypertension Mother    Bipolar disorder Mother    Alcohol abuse Mother    Drug abuse Mother    Cancer Mother        cervical   Leukemia Mother    OCD Father    Anxiety disorder Father    Diabetes Father    Hypertension Father    Bipolar disorder Sister    Drug abuse Sister    Alcohol abuse Sister    Diabetes Maternal Grandmother    Bipolar disorder Maternal Grandmother    Anxiety disorder Maternal Grandmother    Schizophrenia Maternal Grandmother     Liver disease Paternal Grandmother    Hypertension Paternal Aunt    Diabetes Paternal Aunt    Hypertension Other    Diabetes Other            Rolinda Rogue, MD 11/28/23 [phone]

## 2023-11-29 ENCOUNTER — Other Ambulatory Visit: Payer: Self-pay

## 2023-11-29 ENCOUNTER — Telehealth: Payer: Self-pay

## 2023-11-29 DIAGNOSIS — Z8489 Family history of other specified conditions: Secondary | ICD-10-CM

## 2023-11-29 NOTE — Telephone Encounter (Signed)
 Copied from CRM [id number]. Topic: Clinical - Request for Lab/Test Order >> Nov 29, 2023 12:35 PM [name] wrote: Reason for CRM: Patient is calling in because she has sent MyChart messages regarding getting lab orders put in for a genetic mutation disorder. Patient says her mother and brother have the gene and she was advised to get tested for it as well. Please follow up with patient.

## 2023-11-30 LAB — CULTURE, GROUP A STREP (THRC)

## 2023-12-02 ENCOUNTER — Ambulatory Visit (HOSPITAL_COMMUNITY): Payer: Self-pay

## 2023-12-04 ENCOUNTER — Encounter (INDEPENDENT_AMBULATORY_CARE_PROVIDER_SITE_OTHER): Payer: Self-pay | Admitting: Otolaryngology

## 2023-12-09 ENCOUNTER — Encounter (INDEPENDENT_AMBULATORY_CARE_PROVIDER_SITE_OTHER): Payer: Self-pay | Admitting: Physician Assistant

## 2023-12-09 ENCOUNTER — Ambulatory Visit (INDEPENDENT_AMBULATORY_CARE_PROVIDER_SITE_OTHER): Admitting: Otolaryngology

## 2023-12-09 ENCOUNTER — Ambulatory Visit (INDEPENDENT_AMBULATORY_CARE_PROVIDER_SITE_OTHER): Admitting: Physician Assistant

## 2023-12-09 VITALS — BP 124/87 | HR 97 | Ht 65.5 in | Wt 214.0 lb

## 2023-12-09 DIAGNOSIS — J039 Acute tonsillitis, unspecified: Secondary | ICD-10-CM | POA: Diagnosis not present

## 2023-12-09 MED ORDER — FLUTICASONE PROPIONATE 50 MCG/ACT NA SUSP: 2.0000 | Freq: Every day | NASAL | 6 refills | Status: DC

## 2023-12-09 MED ORDER — CHLORHEXIDINE GLUCONATE 0.12 % MT SOLN: 15.0000 mL | Freq: Two times a day (BID) | OROMUCOSAL | 0 refills | Status: AC

## 2023-12-09 NOTE — Progress Notes (Signed)
 Dear Dr. Bevely, Here is my assessment for our mutual patient, Megan Houston. Thank you for allowing me the opportunity to care for your patient. Please do not hesitate to contact me should you have any other questions. Sincerely, Megan Cohen PA-C  Otolaryngology Clinic Note Referring provider: Dr. Bevely HPI:  Megan Houston is a 33 y.o. female kindly referred by Dr. Bevely   The patient is a 33 year old female seen in our office for follow-up evaluation of tonsillar swelling.  The patient notes that over the last several months she has had on and off episodes of swelling and yellow discoloration of her tonsils predominantly on the right.  She had been seen and evaluated urgent care where she had a negative strep test.  She was put on prednisone  40 mg for 5 days, she notes that this did not help.  She notes that approximate once per month she does have some what feels like swelling, no severe pain.  She notes some additional postnasal drainage more particularly over the last several months because its fall.  She notes a bad taste in her mouth.  She does note a history of tonsil stones.   Independent Review of Additional Tests or Records:  None   PMH/Meds/All/SocHx/FamHx/ROS:   Past Medical History:  Diagnosis Date   Anxiety    Asthma    Bipolar disorder (HCC)    Depression    Heart murmur    Lipoma of back    MDD (major depressive disorder)    PMDD (premenstrual dysphoric disorder)    Pregnancy induced hypertension    Severe preeclampsia, postpartum condition 05/09/2021     Past Surgical History:  Procedure Laterality Date   CESAREAN SECTION N/A 05/10/2021   Procedure: CESAREAN SECTION;  Surgeon: Megan Devaughn Sayres, MD;  Location: [facility];  Service: Obstetrics;  Laterality: N/A;   LIPOMA EXCISION N/A 01/01/2020   Procedure: EXCISION of LIPOMA from BACK;  Surgeon: Megan Anes, MD;  Location: [facility];  Service: General;  Laterality: N/A;    Family History  Problem  Relation Age of Onset   Diabetes Mother    Hypertension Mother    Bipolar disorder Mother    Alcohol abuse Mother    Drug abuse Mother    Cancer Mother        cervical   Leukemia Mother    OCD Father    Anxiety disorder Father    Diabetes Father    Hypertension Father    Bipolar disorder Sister    Drug abuse Sister    Alcohol abuse Sister    Diabetes Maternal Grandmother    Bipolar disorder Maternal Grandmother    Anxiety disorder Maternal Grandmother    Schizophrenia Maternal Grandmother    Liver disease Paternal Grandmother    Hypertension Paternal Aunt    Diabetes Paternal Aunt    Hypertension Other    Diabetes Other      Social Connections: Moderately Isolated (08/20/2023)   Social Connection and Isolation Panel    Frequency of Communication with Friends and Family: Three times a week    Frequency of Social Gatherings with Friends and Family: Never    Attends Religious Services: Never    Database administrator or Organizations: No    Attends Engineer, structural: Not on file    Marital Status: Married      Current Outpatient Medications:    albuterol  (VENTOLIN  HFA) 108 (90 Base) MCG/ACT inhaler, Inhale 2 puffs into the lungs every 6 (six)  hours as needed for wheezing or shortness of breath., Disp: 8 g, Rfl: 2   b complex vitamins capsule, Take 1 capsule by mouth at bedtime., Disp: , Rfl:    Magnesium  250 MG TABS, Take 1 tablet by mouth at bedtime., Disp: , Rfl:    mirabegron  ER (MYRBETRIQ ) 25 MG TB24 tablet, Take 1 tablet (25 mg total) by mouth daily., Disp: 30 tablet, Rfl: 3   pantoprazole  (PROTONIX ) 40 MG tablet, Take 40 mg by mouth daily., Disp: , Rfl:    predniSONE  (DELTASONE ) 20 MG tablet, Take 2 tablets (40 mg total) by mouth daily., Disp: 10 tablet, Rfl: 0   Physical Exam:   LMP 11/27/2023 (Exact Date)   Pertinent Findings  CN II-XII intact Bilateral EAC clear and TM intact with well pneumatized middle ear spacesl  Anterior rhinoscopy: Septum  midline; bilateral inferior turbinates with mild hypertrophy No lesions of oral cavity/oropharynx; dentition within normal limits, bilateral 1+ tonsils, cryptic with no exudate, no tonsil stones No obviously palpable neck masses/lymphadenopathy/thyromegaly No respiratory distress or stridor  Seprately Identifiable Procedures:  None  Impression & Plans:  [name] is a 33 y.o. female with the following   Tonsillitis-  34 year old female presents today with tonsil related issues.  She has deep cryptic tonsils, no exudate or tonsil stones noted on exam today.  She does note a history of halitosis with some exudate and tonsillar stones.  No true acute bacterial tonsillitis reported.  I would recommend salt water  gargles.  She will try this for 2 to 3 weeks.  If this improves her symptoms she will continue on with this.  If not she will attempt chlorhexidine  rinses.  I am happy to see her back in the office if develops any new or worsening signs or symptoms in the meantime.  Additionally she notes some increased postnasal drainage likely secondary to seasonal allergies.  She will use Flonase  for this.  The patient was given strict return precautions, she verbalized understanding and agreement to today's plan had no further questions or concerns.   - f/u PRN   Thank you for allowing me the opportunity to care for your patient. Please do not hesitate to contact me should you have any other questions.  Sincerely, Megan Cohen PA-C [facility] Phone: [phone] Fax: [phone]  12/09/2023, 8:35 AM

## 2023-12-11 ENCOUNTER — Encounter (INDEPENDENT_AMBULATORY_CARE_PROVIDER_SITE_OTHER): Payer: Self-pay

## 2023-12-12 ENCOUNTER — Other Ambulatory Visit: Payer: Self-pay

## 2023-12-12 DIAGNOSIS — F419 Anxiety disorder, unspecified: Secondary | ICD-10-CM

## 2023-12-16 ENCOUNTER — Telehealth: Admitting: Physician Assistant

## 2023-12-16 DIAGNOSIS — J028 Acute pharyngitis due to other specified organisms: Secondary | ICD-10-CM

## 2023-12-16 DIAGNOSIS — B9689 Other specified bacterial agents as the cause of diseases classified elsewhere: Secondary | ICD-10-CM

## 2023-12-16 MED ORDER — AMOXICILLIN-POT CLAVULANATE 875-125 MG PO TABS: 1.0000 | ORAL_TABLET | Freq: Two times a day (BID) | ORAL | 0 refills | Status: AC

## 2023-12-16 NOTE — Progress Notes (Signed)

## 2023-12-23 ENCOUNTER — Telehealth: Payer: Self-pay

## 2023-12-23 NOTE — Telephone Encounter (Signed)
 Not on current med list, last fill 09/10/23

## 2023-12-23 NOTE — Telephone Encounter (Unsigned)
 Copied from CRM [id number]. Topic: Clinical - Medication Refill >> Dec 23, 2023  9:36 AM Thersia C wrote: Medication: clonazePAM  (KLONOPIN ) 0.5 MG tablet  Has the patient contacted their pharmacy? Yes (Agent: If no, request that the patient contact the pharmacy for the refill. If patient does not wish to contact the pharmacy document the reason why and proceed with request.) (Agent: If yes, when and what did the pharmacy advise?)  This is the patient's preferred pharmacy:  [facility] - [place], [place] - [address] [place] [address] Phone: [phone] Fax: [phone]   Is this the correct pharmacy for this prescription? Yes If no, delete pharmacy and type the correct one.   Has the prescription been filled recently? No  Is the patient out of the medication? Yes  Has the patient been seen for an appointment in the last year OR does the patient have an upcoming appointment? Yes  Can we respond through MyChart? Yes  Agent: Please be advised that Rx refills may take up to 3 business days. We ask that you follow-up with your pharmacy.

## 2023-12-25 ENCOUNTER — Other Ambulatory Visit: Payer: Self-pay

## 2023-12-25 ENCOUNTER — Telehealth: Payer: Self-pay

## 2023-12-25 DIAGNOSIS — F419 Anxiety disorder, unspecified: Secondary | ICD-10-CM

## 2023-12-25 MED ORDER — CLONAZEPAM 0.5 MG PO TABS: 0.5000 mg | ORAL_TABLET | Freq: Every day | ORAL | 0 refills | Status: AC | PRN

## 2023-12-25 NOTE — Telephone Encounter (Signed)
 Pt advised with verbal understanding

## 2023-12-25 NOTE — Telephone Encounter (Signed)
 Clonazepam refilled

## 2023-12-25 NOTE — Telephone Encounter (Signed)
 Copied from CRM [id number]. Topic: Clinical - Medication Refill >> Dec 23, 2023  9:36 AM Thersia BROCKS wrote: Medication: clonazePAM  (KLONOPIN ) 0.5 MG tablet  Has the patient contacted their pharmacy? Yes (Agent: If no, request that the patient contact the pharmacy for the refill. If patient does not wish to contact the pharmacy document the reason why and proceed with request.) (Agent: If yes, when and what did the pharmacy advise?)  This is the patient's preferred pharmacy:  [facility] - [place], [place] - [address] [place] [address] Phone: [phone] Fax: [phone]   Is this the correct pharmacy for this prescription? Yes If no, delete pharmacy and type the correct one.   Has the prescription been filled recently? No  Is the patient out of the medication? Yes  Has the patient been seen for an appointment in the last year OR does the patient have an upcoming appointment? Yes  Can we respond through MyChart? Yes  Agent: Please be advised that Rx refills may take up to 3 business days. We ask that you follow-up with your pharmacy.

## 2023-12-26 ENCOUNTER — Encounter: Payer: Self-pay | Admitting: Neurology

## 2023-12-26 ENCOUNTER — Ambulatory Visit: Admitting: Neurology

## 2023-12-26 VITALS — BP 105/92 | HR 78 | Ht 65.0 in | Wt 216.5 lb

## 2023-12-26 DIAGNOSIS — G43709 Chronic migraine without aura, not intractable, without status migrainosus: Secondary | ICD-10-CM | POA: Diagnosis not present

## 2023-12-26 MED ORDER — ONDANSETRON 4 MG PO TBDP: 4.0000 mg | ORAL_TABLET | Freq: Three times a day (TID) | ORAL | 0 refills | Status: AC | PRN

## 2023-12-26 MED ORDER — RIZATRIPTAN BENZOATE 10 MG PO TBDP: 10.0000 mg | ORAL_TABLET | ORAL | 11 refills | Status: DC | PRN

## 2023-12-26 NOTE — Progress Notes (Signed)
 Patient: Megan Houston Date of Birth: 1990-06-28  Reason for Visit: Follow up History from: Patient Primary Neurologist: [facility]  ASSESSMENT AND PLAN 33 y.o. year old female   Chronic migraine headache - 1-2 migraines a month, no longer breast-feeding - Maxalt 10 mg melt as needed for acute migraine, Zofran  for nausea, may add in Aleve for significant headache - Will continue magnesium , B12 for preventative supplement - If she becomes pregnant, stop Maxalt - Follow-up in 6 months virtually - MRI of the brain and CTV head were unremarkable  HISTORY OF PRESENT ILLNESS: Today 12/26/23 12/26/23 SS: Has not been seen since September 2024, at that time advised to take B2 and magnesium  for migraines.  Referred to PT for neck pain. Here today with her 19.35 year old. Still taking B vitamins and magnesium , are better, but could have 1-2 migraines a month. Recently stopped breastfeeding. Has tried OTC, without complete relief. NSAID cause GERD. Migraines can be ice pick stabbing right temple, a lot of tension wraps around frontal area. Gets nauseated. Has not had any sensation of goosebumps on her left arm.   HISTORY  Brief HPI: 33 year old female with a history of anxiety who follows in clinic for migraines and facial numbness. Brain MRI and CTV head in February 2024 were unremarkable.   At her last visit she was recommended to start vitamin B2 200 mg BID and continue magnesium .   Interval History: Headaches have been well-controlled on magnesium . She forgot to try vitamin B2. She has had 1 migraine since her last visit which improved with OTCs. Migraines are associated with paresthesias in her scalp and left arm (states it feels like getting goosebumps). Reports significant neck pain and tension. She is still breastfeeding.   Migraine days per month: 1 Headache free days per month: 29   Current Headache Regimen: Preventative: magnesium  Abortive: tylenol , ibuprofen    Prior  Therapies                                  Tylenol  Ibuprofen  Topamax  - paresthesias Magnesium  (unsure of dose)  REVIEW OF SYSTEMS: Out of a complete 14 system review of symptoms, the patient complains only of the following symptoms, and all other reviewed systems are negative.  See HPI  ALLERGIES: Allergies  Allergen Reactions   Codeine Nausea And Vomiting   Hydrocodone -Acetaminophen  Nausea And Vomiting    HOME MEDICATIONS: Outpatient Medications Prior to Visit  Medication Sig Dispense Refill   albuterol  (VENTOLIN  HFA) 108 (90 Base) MCG/ACT inhaler Inhale 2 puffs into the lungs every 6 (six) hours as needed for wheezing or shortness of breath. 8 g 2   b complex vitamins capsule Take 1 capsule by mouth at bedtime.     clonazePAM  (KLONOPIN ) 0.5 MG tablet Take 1 tablet (0.5 mg total) by mouth daily as needed for anxiety. 30 tablet 0   Magnesium  250 MG TABS Take 1 tablet by mouth at bedtime.     pantoprazole  (PROTONIX ) 40 MG tablet Take 40 mg by mouth daily.     amoxicillin -clavulanate (AUGMENTIN) 875-125 MG tablet Take 1 tablet by mouth 2 (two) times daily. (Patient not taking: Reported on 12/26/2023) 20 tablet 0   No facility-administered medications prior to visit.    PAST MEDICAL HISTORY: Past Medical History:  Diagnosis Date   Anxiety    Asthma    Bipolar disorder (HCC)    Depression    Heart murmur  Lipoma of back    MDD (major depressive disorder)    PMDD (premenstrual dysphoric disorder)    Pregnancy induced hypertension    Severe preeclampsia, postpartum condition 05/09/2021    PAST SURGICAL HISTORY: Past Surgical History:  Procedure Laterality Date   CESAREAN SECTION N/A 05/10/2021   Procedure: CESAREAN SECTION;  Surgeon: Kandis Devaughn Sayres, MD;  Location: [facility];  Service: Obstetrics;  Laterality: N/A;   LIPOMA EXCISION N/A 01/01/2020   Procedure: EXCISION of LIPOMA from BACK;  Surgeon: Mavis Anes, MD;  Location: [facility];  Service: General;  Laterality:  N/A;    FAMILY HISTORY: Family History  Problem Relation Age of Onset   Diabetes Mother    Hypertension Mother    Bipolar disorder Mother    Alcohol abuse Mother    Drug abuse Mother    Cancer Mother        cervical   Leukemia Mother    OCD Father    Anxiety disorder Father    Diabetes Father    Hypertension Father    Bipolar disorder Sister    Drug abuse Sister    Alcohol abuse Sister    Diabetes Maternal Grandmother    Bipolar disorder Maternal Grandmother    Anxiety disorder Maternal Grandmother    Schizophrenia Maternal Grandmother    Liver disease Paternal Grandmother    Hypertension Paternal Aunt    Diabetes Paternal Aunt    Hypertension Other    Diabetes Other     SOCIAL HISTORY: Social History   Socioeconomic History   Marital status: Married    Spouse name: Not on file   Number of children: 0   Years of education: Not on file   Highest education level: Some college, no degree  Occupational History   Not on file  Tobacco Use   Smoking status: Former    Current packs/day: 0.00    Average packs/day: 0.3 packs/day for 4.0 years (1.0 ttl pk-yrs)    Types: Cigarettes    Start date: 05/27/2010    Quit date: 05/27/2014    Years since quitting: 9.5   Smokeless tobacco: Never  Vaping Use   Vaping status: Never Used  Substance and Sexual Activity   Alcohol use: Not Currently   Drug use: No   Sexual activity: Yes    Birth control/protection: None  Other Topics Concern   Not on file  Social History Narrative   Not on file   Social Drivers of Health   Financial Resource Strain: Low Risk  (08/20/2023)   Overall Financial Resource Strain (CARDIA)    Difficulty of Paying Living Expenses: Not very hard  Food Insecurity: No Food Insecurity (08/22/2023)   Hunger Vital Sign    Worried About Running Out of Food in the Last Year: Never true    Ran Out of Food in the Last Year: Never true  Transportation Needs: No Transportation Needs (08/20/2023)   PRAPARE -  Administrator, Civil Service (Medical): No    Lack of Transportation (Non-Medical): No  Physical Activity: Sufficiently Active (08/20/2023)   Exercise Vital Sign    Days of Exercise per Week: 5 days    Minutes of Exercise per Session: 50 min  Stress: No Stress Concern Present (08/20/2023)   [employer] of Occupational Health - Occupational Stress Questionnaire    Feeling of Stress: Not at all  Social Connections: Moderately Isolated (08/20/2023)   Social Connection and Isolation Panel    Frequency of Communication with Friends  and Family: Three times a week    Frequency of Social Gatherings with Friends and Family: Never    Attends Religious Services: Never    Database Administrator or Organizations: No    Attends Engineer, Structural: Not on file    Marital Status: Married  Catering Manager Violence: Not At Risk (08/22/2023)   Humiliation, Afraid, Rape, and Kick questionnaire    Fear of Current or Ex-Partner: No    Emotionally Abused: No    Physically Abused: No    Sexually Abused: No    PHYSICAL EXAM  Vitals:   12/26/23 1517  BP: (!) 105/92  Pulse: 78  SpO2: 99%  Weight: 216 lb 8 oz (98.2 kg)  Height: 5' 5 (1.651 m)   Body mass index is 36.03 kg/m.  Generalized: Well developed, in no acute distress  Neurological examination  Mentation: Alert oriented to time, place, history taking. Follows all commands speech and language fluent Cranial nerve II-XII: Pupils were equal round reactive to light. Extraocular movements were full, visual field were full on confrontational test. Facial sensation and strength were normal.  Head turning and shoulder shrug  were normal and symmetric. Motor: The motor testing reveals 5 over 5 strength of all 4 extremities. Good symmetric motor tone is noted throughout.  Sensory: Sensory testing is intact to soft touch on all 4 extremities. No evidence of extinction is noted.  Coordination: Cerebellar testing reveals good  finger-nose-finger and heel-to-shin bilaterally.  Gait and station: Gait is normal.  DIAGNOSTIC DATA (LABS, IMAGING, TESTING) - I reviewed patient records, labs, notes, testing and imaging myself where available.  Lab Results  Component Value Date   WBC 11.8 (H) 08/21/2023   HGB 14.4 08/21/2023   HCT 45.1 08/21/2023   MCV 85 08/21/2023   PLT 318 08/21/2023      Component Value Date/Time   NA 140 08/21/2023 0837   K 4.5 08/21/2023 0837   CL 104 08/21/2023 0837   CO2 21 08/21/2023 0837   GLUCOSE 83 08/21/2023 0837   GLUCOSE 93 04/18/2022 2101   BUN 15 08/21/2023 0837   CREATININE 0.76 08/21/2023 0837   CALCIUM 9.4 08/21/2023 0837   PROT 6.9 08/21/2023 0837   ALBUMIN 4.2 08/21/2023 0837   AST 14 08/21/2023 0837   ALT 14 08/21/2023 0837   ALKPHOS 105 08/21/2023 0837   BILITOT 0.8 08/21/2023 0837   GFRNONAA >60 04/18/2022 2101   GFRAA >60 06/25/2017 0851   Lab Results  Component Value Date   CHOL 150 08/21/2023   HDL 38 (L) 08/21/2023   LDLCALC 85 08/21/2023   TRIG 155 (H) 08/21/2023   CHOLHDL 3.9 08/21/2023   Lab Results  Component Value Date   HGBA1C 5.7 (H) 08/21/2023   Lab Results  Component Value Date   VITAMINB12 755 08/20/2022   Lab Results  Component Value Date   TSH 2.200 08/21/2023   Lauraine Born, AGNP-C, DNP 12/26/2023, 3:23 PM [facility] [address], [address] [place], [place] [address] [phone]

## 2023-12-26 NOTE — Patient Instructions (Signed)
 For acute migraine: Take Maxalt 10 mg dissolve tablet, may add in Zofran  for nausea, add in Aleve if severe

## 2023-12-27 ENCOUNTER — Telehealth: Payer: Self-pay | Admitting: Pharmacist

## 2023-12-27 ENCOUNTER — Other Ambulatory Visit (HOSPITAL_COMMUNITY): Payer: Self-pay

## 2023-12-27 MED ORDER — RIZATRIPTAN BENZOATE 10 MG PO TABS: 10.0000 mg | ORAL_TABLET | ORAL | 3 refills | Status: AC | PRN

## 2023-12-27 NOTE — Addendum Note (Signed)
 Addended by: GAYLAND LAURAINE PARAS on: 12/27/2023 02:37 PM   Modules accepted: Orders

## 2023-12-27 NOTE — Telephone Encounter (Signed)
 Pharmacy Patient Advocate Encounter   Received notification from Patient Pharmacy that prior authorization for Rizatriptan Benzoate 10MG  dispersible tablets is required/requested.   Insurance verification completed.   The patient is insured through [facility].   Per test claim: Per test claim, medication is not covered due to plan/benefit exclusion, PA not submitted at this time

## 2023-12-27 NOTE — Telephone Encounter (Signed)
Meds ordered this encounter  Medications  . rizatriptan (MAXALT) 10 MG tablet    Sig: Take 1 tablet (10 mg total) by mouth as needed for migraine. May repeat in 2 hours if needed    Dispense:  10 tablet    Refill:  3

## 2023-12-31 ENCOUNTER — Ambulatory Visit (INDEPENDENT_AMBULATORY_CARE_PROVIDER_SITE_OTHER): Admitting: Otolaryngology

## 2024-01-03 DIAGNOSIS — F411 Generalized anxiety disorder: Secondary | ICD-10-CM | POA: Diagnosis not present

## 2024-01-15 ENCOUNTER — Encounter (INDEPENDENT_AMBULATORY_CARE_PROVIDER_SITE_OTHER): Payer: Self-pay | Admitting: Physician Assistant

## 2024-01-15 ENCOUNTER — Ambulatory Visit (INDEPENDENT_AMBULATORY_CARE_PROVIDER_SITE_OTHER): Admitting: Physician Assistant

## 2024-01-15 VITALS — BP 121/78 | HR 74

## 2024-01-15 DIAGNOSIS — J312 Chronic pharyngitis: Secondary | ICD-10-CM | POA: Diagnosis not present

## 2024-01-15 DIAGNOSIS — R0981 Nasal congestion: Secondary | ICD-10-CM | POA: Diagnosis not present

## 2024-01-15 DIAGNOSIS — J039 Acute tonsillitis, unspecified: Secondary | ICD-10-CM

## 2024-01-15 DIAGNOSIS — R0982 Postnasal drip: Secondary | ICD-10-CM | POA: Diagnosis not present

## 2024-01-15 DIAGNOSIS — R196 Halitosis: Secondary | ICD-10-CM

## 2024-01-15 DIAGNOSIS — K219 Gastro-esophageal reflux disease without esophagitis: Secondary | ICD-10-CM | POA: Diagnosis not present

## 2024-01-17 MED ORDER — FAMOTIDINE 20 MG PO TABS: 20.0000 mg | ORAL_TABLET | Freq: Two times a day (BID) | ORAL | 1 refills | Status: AC

## 2024-01-17 NOTE — Progress Notes (Signed)
 Dear Dr. Bevely, Here is my assessment for our mutual patient, Megan Houston. Thank you for allowing me the opportunity to care for your patient. Please do not hesitate to contact me should you have any other questions. Sincerely, Chyrl Cohen PA-C  Otolaryngology Clinic Note Referring provider: Dr. Bevely HPI:  Megan Houston is a 33 y.o. female kindly referred by Dr. Bevely   Discussed the use of AI scribe software for clinical note transcription with the patient, who gave verbal consent to proceed.  History of Present Illness    Megan Houston is a 33 year old female with GERD who presents with a persistent sore throat and halitosis.  The patient was last seen in the office on 12/09/2023.  Below is a recap of encounter.  The patient is a 33 year old female seen in our office for follow-up evaluation of tonsillar swelling.  The patient notes that over the last several months she has had on and off episodes of swelling and yellow discoloration of her tonsils predominantly on the right.  She had been seen and evaluated urgent care where she had a negative strep test.  She was put on prednisone  40 mg for 5 days, she notes that this did not help.  She notes that approximate once per month she does have some what feels like swelling, no severe pain.  She notes some additional postnasal drainage more particularly over the last several months because its fall.  She notes a bad taste in her mouth.  She does note a history of tonsil stones.   Update 01/15/2024-    She has been experiencing a progressively worsening sore throat since August 31st, with daily pain. She reports that her tonsils feel swollen to her, with tissue protruding through crevices, which she finds unusual. Initially, she suspected strep throat due to the presence of pus, but a test was negative. Despite this, antibiotics were prescribed, providing some relief. Since the pus cleared, her throat has remained irritated and  painful.  She experiences pain under her breastbone when swallowing, which she associates with possible esophageal issues. She has a history of acid reflux and is currently taking pantoprazole  40 mg, which she feels helps. She has been performing saltwater gargles and using chlorhexidine , which she finds beneficial. She also uses a humidifier at night to alleviate symptoms.  She reports chronic halitosis, which she describes as a long-standing issue affecting her self-esteem. She associates this with her GERD and throat irritation. She has a history of weight loss efforts to manage her GERD and reports not eating at night to help with her symptoms.  She reports ear pain that she associates with her throat issues. She has not been using Flonase , which was previously recommended. She does not report significant nasal symptoms or postnasal drip. Reports bilateral throat soreness and pain when swallowing. She reports a suspected strep throat in the past, but her test was negative.         Independent Review of Additional Tests or Records:  None   PMH/Meds/All/SocHx/FamHx/ROS:   Past Medical History:  Diagnosis Date   Anxiety    Asthma    Bipolar disorder (HCC)    Depression    Heart murmur    Lipoma of back    MDD (major depressive disorder)    PMDD (premenstrual dysphoric disorder)    Pregnancy induced hypertension    Severe preeclampsia, postpartum condition 05/09/2021     Past Surgical History:  Procedure Laterality Date   CESAREAN SECTION N/A  05/10/2021   Procedure: CESAREAN SECTION;  Surgeon: Kandis Devaughn Sayres, MD;  Location: [facility] LD ORS;  Service: Obstetrics;  Laterality: N/A;   LIPOMA EXCISION N/A 01/01/2020   Procedure: EXCISION of LIPOMA from BACK;  Surgeon: Mavis Anes, MD;  Location: [facility] ORS;  Service: General;  Laterality: N/A;    Family History  Problem Relation Age of Onset   Diabetes Mother    Hypertension Mother    Bipolar disorder Mother    Alcohol abuse Mother     Drug abuse Mother    Cancer Mother        cervical   Leukemia Mother    OCD Father    Anxiety disorder Father    Diabetes Father    Hypertension Father    Bipolar disorder Sister    Drug abuse Sister    Alcohol abuse Sister    Diabetes Maternal Grandmother    Bipolar disorder Maternal Grandmother    Anxiety disorder Maternal Grandmother    Schizophrenia Maternal Grandmother    Liver disease Paternal Grandmother    Hypertension Paternal Aunt    Diabetes Paternal Aunt    Hypertension Other    Diabetes Other      Social Connections: Moderately Isolated (08/20/2023)   Social Connection and Isolation Panel    Frequency of Communication with Friends and Family: Three times a week    Frequency of Social Gatherings with Friends and Family: Never    Attends Religious Services: Never    Database Administrator or Organizations: No    Attends Engineer, Structural: Not on file    Marital Status: Married      Current Outpatient Medications:    albuterol  (VENTOLIN  HFA) 108 (90 Base) MCG/ACT inhaler, Inhale 2 puffs into the lungs every 6 (six) hours as needed for wheezing or shortness of breath., Disp: 8 g, Rfl: 2   b complex vitamins capsule, Take 1 capsule by mouth at bedtime., Disp: , Rfl:    clonazePAM  (KLONOPIN ) 0.5 MG tablet, Take 1 tablet (0.5 mg total) by mouth daily as needed for anxiety., Disp: 30 tablet, Rfl: 0   Magnesium  250 MG TABS, Take 1 tablet by mouth at bedtime., Disp: , Rfl:    ondansetron  (ZOFRAN -ODT) 4 MG disintegrating tablet, Take 1 tablet (4 mg total) by mouth every 8 (eight) hours as needed for nausea or vomiting., Disp: 20 tablet, Rfl: 0   pantoprazole  (PROTONIX ) 40 MG tablet, Take 40 mg by mouth daily., Disp: , Rfl:    rizatriptan  (MAXALT ) 10 MG tablet, Take 1 tablet (10 mg total) by mouth as needed for migraine. May repeat in 2 hours if needed, Disp: 10 tablet, Rfl: 3   amoxicillin -clavulanate (AUGMENTIN ) 875-125 MG tablet, Take 1 tablet by mouth 2  (two) times daily. (Patient not taking: Reported on 01/15/2024), Disp: 20 tablet, Rfl: 0   Physical Exam:   BP 121/78   Pulse 74   SpO2 96%   Pertinent Findings  CN II-XII grossly intact Bilateral EAC clear and TM intact with well pneumatized middle ear spaces Weber 512: equal Rinne 512: AC > BC b/l  Anterior rhinoscopy: Septum ; bilateral inferior turbinates with no hypertrophy No lesions of oral cavity/oropharynx; dentition within normal limits, 1+ tonsils, no exudate No obviously palpable neck masses/lymphadenopathy/thyromegaly No respiratory distress or stridor      Seprately Identifiable Procedures:  None  Impression & Plans:  Rethel Sebek is a 33 y.o. female with the following   Assessment and Plan  Chronic sore throat associated with gastroesophageal reflux disease and postnasal drip -I have high suspicion her chronic sore throat likely due to GERD and postnasal drip. GERD suspected to cause throat irritation and halitosis. Postnasal drip may contribute to sore throat. No overt findings to suggest infection or masses.   - Continue Protonix . - Start famotidine (Pepcid) at night. - Use reflux formate after meals and before bed. - Avoid caffeine, alcohol, and large meals. - Increase water  intake during the day. - Use Flonase  for postnasal drip. - Perform saline irrigation of sinuses. - Follow up in three months or sooner if symptoms worsen.  Chronic nasal congestion Contributing to postnasal drip and sore throat. Congestion present. - Use Flonase  for nasal congestion. - Perform saline irrigation of sinuses.  Halitosis Chronic halitosis likely secondary to GERD and postnasal drip. GERD management essential. - Continue GERD management with Protonix  and famotidine. - Use reflux formate to reduce throat irritation. - Use Flonase  for postnasal drip.           - f/u 79-month follow-up   Thank you for allowing me the opportunity to care for your patient.  Please do not hesitate to contact me should you have any other questions.  Sincerely, Chyrl Cohen PA-C [facility] Phone: [phone] Fax: [phone]  01/17/2024, 12:15 PM

## 2024-01-23 ENCOUNTER — Telehealth (INDEPENDENT_AMBULATORY_CARE_PROVIDER_SITE_OTHER): Payer: Self-pay

## 2024-01-23 NOTE — Telephone Encounter (Signed)
 Called and left voicemail for patient regarding prescription for Famotidine. I explained that her insurance is not going to cover the medication. I explained that it can be bought over the counter.

## 2024-01-28 ENCOUNTER — Encounter: Payer: Self-pay | Admitting: Neurology

## 2024-02-10 ENCOUNTER — Encounter: Payer: Self-pay | Admitting: Genetic Counselor

## 2024-02-10 ENCOUNTER — Inpatient Hospital Stay: Attending: Internal Medicine | Admitting: Genetic Counselor

## 2024-02-10 ENCOUNTER — Inpatient Hospital Stay

## 2024-02-10 DIAGNOSIS — Z8481 Family history of carrier of genetic disease: Secondary | ICD-10-CM

## 2024-02-10 LAB — GENETIC SCREENING ORDER

## 2024-02-10 NOTE — Progress Notes (Addendum)
 REFERRING PROVIDER: Bevely Doffing, FNP [phone] [place],  [place] [address]  PRIMARY PROVIDER:  Bevely Doffing, FNP  PRIMARY REASON FOR VISIT:  1. Family history of carrier of genetic disease      HISTORY OF PRESENT ILLNESS:   Megan Houston, a 33 y.o. female, was seen for a [place] cancer genetics consultation at the request of Huenink, Doffing, FNP due to a family history of cancer and family history of a pathogenic variant in SDHA.  Megan Houston presents to clinic today to discuss the possibility of a hereditary predisposition to cancer, to discuss genetic testing, and to further clarify her future cancer risks, as well as potential cancer risks for family members.   Megan Houston is a 33 y.o. female with no personal history of cancer.    RELEVANT MEDICAL HISTORY:  Menarche was at age 56.  First live birth at age 30.  Ovaries intact: yes.  Uterus intact: yes.  Menopausal status: premenopausal.  HRT use: 0 years. Colonoscopy: yes; reports colonoscopy April 2025, couple polyps, five year f/u. Mammogram within the last year: no. Number of breast biopsies: 0. Headaches twice per month, takes vitamins to minimize History of heart palpitations, reports history of normal EKG  Anxiety  History of severe post-partum preeclampsia, trouble with elevated blood pressures since, no medication  Tinnitus, reports a couple minor episodes per month  Gradual weight loss  No swallowing difficulties, vocal changes, episodes of abnormal sweating, neck masses.   Past Medical History:  Diagnosis Date   Anxiety    Asthma    Bipolar disorder (HCC)    Depression    Heart murmur    Lipoma of back    MDD (major depressive disorder)    PMDD (premenstrual dysphoric disorder)    Pregnancy induced hypertension    Severe preeclampsia, postpartum condition 05/09/2021    Past Surgical History:  Procedure Laterality Date   CESAREAN SECTION N/A 05/10/2021   Procedure: CESAREAN SECTION;   Surgeon: Kandis Devaughn Sayres, MD;  Location: [facility] LD ORS;  Service: Obstetrics;  Laterality: N/A;   LIPOMA EXCISION N/A 01/01/2020   Procedure: EXCISION of LIPOMA from BACK;  Surgeon: Mavis Anes, MD;  Location: [facility] ORS;  Service: General;  Laterality: N/A;    Social History   Socioeconomic History   Marital status: Married    Spouse name: Not on file   Number of children: 0   Years of education: Not on file   Highest education level: Some college, no degree  Occupational History   Not on file  Tobacco Use   Smoking status: Former    Current packs/day: 0.00    Average packs/day: 0.3 packs/day for 4.0 years (1.0 ttl pk-yrs)    Types: Cigarettes    Start date: 05/27/2010    Quit date: 05/27/2014    Years since quitting: 9.7   Smokeless tobacco: Never  Vaping Use   Vaping status: Never Used  Substance and Sexual Activity   Alcohol use: Not Currently   Drug use: No   Sexual activity: Yes    Birth control/protection: None  Other Topics Concern   Not on file  Social History Narrative   Not on file   Social Drivers of Health   Tobacco Use: Medium Risk (01/15/2024)   Patient History    Smoking Tobacco Use: Former    Smokeless Tobacco Use: Never    Passive Exposure: Not on file  Financial Resource Strain: Medium Risk (02/07/2024)   Received from [facility]   Overall Financial Resource Strain (CARDIA)    How hard is it for you to pay for the very basics like food, housing, medical care, and heating?: Somewhat hard  Food Insecurity: Food Insecurity Present (02/07/2024)   Received from [facility]   Epic    Within the past 12 months, you worried that your food would run out before you got the money to buy more.: Sometimes true    Ran Out of Food in the Last Year: Not on file  Transportation Needs: No Transportation Needs (02/07/2024)   Received from [facility]    In the past 12 months, has lack of transportation kept you from medical appointments or from getting  medications?: No    In the past 12 months, has lack of transportation kept you from meetings, work, or from getting things needed for daily living?: No  Physical Activity: Insufficiently Active (02/07/2024)   Received from [facility]   Exercise Vital Sign    On average, how many days per week do you engage in moderate to strenuous exercise (like a brisk walk)?: 4 days    On average, how many minutes do you engage in exercise at this level?: 20 min  Stress: No Stress Concern Present (02/07/2024)   Received from [facility] - Occupational Stress Questionnaire    Do you feel stress - tense, restless, nervous, or anxious, or unable to sleep at night because your mind is troubled all the time - these days?: Only a little  Social Connections: Moderately Integrated (02/07/2024)   [facility] from [facility]   Social Network    How would you rate your social network (family, work, friends)?: Adequate participation with social networks  Depression (PHQ2-9): Low Risk (08/22/2023)   Depression (PHQ2-9)    PHQ-2 Score: 1  Alcohol Screen: Low Risk (08/22/2023)   Alcohol Screen    Last Alcohol Screening Score (AUDIT): 0  Housing: Unknown (02/07/2024)   Received from [facility]    In the last 12 months, was there a time when you were not able to pay the mortgage or rent on time?: Patient declined    In the past 12 months, how many times have you moved where you were living?: 0    At any time in the past 12 months, were you homeless or living in a shelter (including now)?: No  Utilities: Not At Risk (02/07/2024)   Received from [facility]    In the past 12 months has the electric, gas, oil, or water  company threatened to shut off services in your home?: No  Health Literacy: Adequate Health Literacy (08/22/2023)   B1300 Health Literacy    Frequency of need for help with medical instructions: Never     FAMILY HISTORY:  We obtained a  detailed, 4-generation family history.  Significant diagnoses are listed below: Family History  Problem Relation Age of Onset   Other Mother        SDHA positive   Diabetes Mother    Hypertension Mother    Bipolar disorder Mother    Alcohol abuse Mother    Drug abuse Mother    Cancer Mother 65 - 80   Leukemia Mother 60   OCD Father    Anxiety disorder Father    Diabetes Father    Hypertension Father    Bipolar disorder Sister    Drug abuse Sister  Alcohol abuse Sister    Heart failure Half-Brother 24 - 70       heart transplant at 31   Other Half-Brother        SDHA positive   Hypertension Paternal Aunt    Diabetes Paternal Aunt    Diabetes Maternal Grandmother    Bipolar disorder Maternal Grandmother    Anxiety disorder Maternal Grandmother    Schizophrenia Maternal Grandmother    Throat cancer Maternal Grandfather    Liver disease Paternal Grandmother    Lung cancer Paternal Grandmother    Hypertension Other    Diabetes Other     Megan Houston is aware of previous family history of genetic testing for hereditary cancer risks. Her maternal half-brother and motehr underwent genetic testing through [facility] based on her other maternal-half brother's history of a heart transplant and heart failure. Unsure if her brother with heart failure has had testing, but her mother and other half-brother were identified to carry a pathogenic SDHA c.[id number]>T, p.R31* There is no reported Ashkenazi Jewish ancestry.     GENETIC COUNSELING ASSESSMENT: Megan Houston is a 33 y.o. female with a family history of a pathogenic variant in SDHA, which increases her risk for a hereditary predisposition to tumors and cancer. We, therefore, discussed and recommended the following at today's visit.   DISCUSSION: Single pathogenic variants in the SDHA gene are associated with the development of paragangliomas (PGL) and pheochromocytomas [facility]) as seen in hereditary paraganglioma-pheochromocytoma syndrome,  gastrointestinal stromal tumors (GIST), and renal cancer. Autosomal recessive, or biallelic SDHA variants have been reported with mitochondrial complex II deficiency. The clinical presentation is highly variable among those with a pathogenic variant in SDHA and may be difficult to predict. An individual with a single SDHA pathogenic variant will not necessarily develop cancer in their lifetime, but the risk for cancer is increased over that of the general population. Penetrance by age 46 has been predicted to be less than 10%.   Management Management will differ depending on the condition associated with the SDHA variant, but for tumors and cancers may include regular blood pressure monitoring, annual blood or urine testing, imaging of skull base and pelvis every 2-3 years.   Inheritance Pathogenic variants in SDHA resulting in GIST, PGL, and/or PCC have autosomal dominant inheritance. This means that an individual with a pathogenic variant has a 50% chance of passing the condition on to their offspring. With this result, it is now possible to identify at-risk relatives who can pursue testing for this specific familial variant. Many cases are inherited from a parent, but some cases can occur spontaneously (i.e., an individual with a pathogenic variant has parents who do not have it).  Individuals with a pathogenic variant in SDHA are also carriers of mitochondrial complex II deficiency syndrome. This autosomal recessive condition is characterized by neurodegeneration and encephalomyopathy (PMID: 77027051; MedGen UID: 655598). For there to be a risk of this condition in offspring, both parents each have to have a pathogenic variant in SDHA; in such a case, the risk of having an affected child is 25%.   We reviewed the characteristics, features and inheritance patterns of hereditary cancer syndromes. We also discussed genetic testing, including the appropriate family members to test, the process of testing,  insurance coverage and turn-around-time for results. We discussed the implications of a negative, positive, carrier and/or variant of uncertain significant result. We discussed that negative results would be uninformative given that Megan Houston does not have a personal history of cancer. We recommended  Megan Houston pursue genetic testing for a panel that contains genes associated with SDHA.  Megan Houston was offered a common hereditary cancer panel (40+ genes) and an expanded pan-cancer panel (70+ genes). Megan Houston was informed of the benefits and limitations of each panel, including that expanded pan-cancer panels contain several genes that do not have clear management guidelines at this point in time.  We also discussed that as the number of genes included on a panel increases, the chances of variants of uncertain significance increases.  After considering the benefits and limitations of each gene panel, Megan Houston elected to have [facility]'s MultiCancer +RNA panel.   Based on Megan Houston's family history of cancer, she meets medical criteria for genetic testing. Though Megan Houston is not personally affected, there are no affected family members that are willing/able to undergo hereditary cancer testing. Despite that she meets criteria, she may still have an out of pocket cost. We discussed that if her out of pocket cost for testing is over $250, the laboratory should contact them to discuss self-pay prices, patient pay assistance programs, if applicable, and other billing options.  We discussed that some people do not want to undergo genetic testing due to fear of genetic discrimination.  A federal law called the Genetic Information Non-Discrimination Act (GINA) of 2008 helps protect individuals against genetic discrimination based on their genetic test results.  It impacts both health insurance and employment.  With health insurance, it protects against increased premiums, being kicked off insurance or being  forced to take a test in order to be insured.  For employment it protects against hiring, firing and promoting decisions based on genetic test results.  GINA does not apply to those in the [employer], those who work for companies with less than 15 employees, and new life insurance or long-term disability insurance policies.  Health status due to a cancer diagnosis is not protected under GINA.  PLAN: After considering the risks, benefits, and limitations, Megan Houston provided informed consent to pursue genetic testing and the blood Houston was sent to [facility] for analysis of the Multi-Cancer +RNA panel. Results should be available within approximately 2-3 weeks' time, at which point they will be disclosed by telephone to Megan Houston, as will any additional recommendations warranted by these results. Megan Houston will receive a summary of her genetic counseling visit and a copy of her results once available. This information will also be available in Epic.   Lastly, we encouraged Megan Houston to remain in contact with cancer genetics annually so that we can continuously update the family history and inform her of any changes in cancer genetics and testing that may be of benefit for this family.   Megan Houston questions were answered to her satisfaction today. Our contact information was provided should additional questions or concerns arise. Thank you for the referral and allowing us  to share in the care of your patient.   Burnard Ogren, MS, [facility] Licensed, [email or web address] phone: [phone]   65 minutes were spent on the date of the encounter in service to the patient including preparation, face-to-face consultation, documentation and care coordination.  The patient was seen alone.  Drs. Gudena and/or Lanny were available to discuss this case as needed.  _______________________________________________________________________ For Office Staff:  Number of people  involved in session: 1 Was an Intern/ student involved with case: no

## 2024-02-19 ENCOUNTER — Ambulatory Visit: Admitting: Urology

## 2024-02-21 ENCOUNTER — Telehealth: Payer: Self-pay | Admitting: Genetic Counselor

## 2024-02-21 ENCOUNTER — Ambulatory Visit: Payer: Self-pay | Admitting: Genetic Counselor

## 2024-02-21 DIAGNOSIS — Z1379 Encounter for other screening for genetic and chromosomal anomalies: Secondary | ICD-10-CM | POA: Insufficient documentation

## 2024-02-21 NOTE — Progress Notes (Signed)
 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 Please refer to our prior cancer genetics clinic note for more information regarding our discussion, assessment and recommendations, at the time. Megan Houston recent genetic test results were disclosed to her, as were recommendations warranted by these results. These results and recommendations are discussed in more detail below.  Results were disclosed via telephone on 02/21/24.   FAMILY HISTORY:  We obtained a detailed, 4-generation family history.  Significant diagnoses are listed below: Family History  Problem Relation Age of Onset   Other Mother        SDHA positive   Diabetes Mother    Hypertension Mother    Bipolar disorder Mother    Alcohol abuse Mother    Drug abuse Mother    Cancer Mother 63 - 71   Leukemia Mother 18   OCD Father    Anxiety disorder Father    Diabetes Father    Hypertension Father    Bipolar disorder Sister    Drug abuse Sister    Alcohol abuse Sister    Heart failure Half-Brother 18 - 61       heart transplant at 23   Other Half-Brother        SDHA positive   Hypertension Paternal Aunt    Diabetes Paternal Aunt    Diabetes Maternal Grandmother    Bipolar disorder Maternal Grandmother    Anxiety disorder Maternal Grandmother    Schizophrenia Maternal Grandmother    Throat cancer Maternal Grandfather    Liver disease Paternal Grandmother    Lung cancer Paternal Grandmother    Hypertension Other    Diabetes Other     Megan Houston is aware of previous family history of genetic testing for hereditary cancer risks. Her maternal half-brother and motehr underwent genetic testing through [facility] based on her other maternal-half brother's history of a heart transplant and heart failure. Unsure if her brother with heart failure has had testing, but her  mother and other half-brother were identified to carry a pathogenic SDHA c.91C>T, p.R31* There is no reported Ashkenazi Jewish ancestry.      GENETIC TEST RESULTS: Genetic testing reported out on 02/19/24 through the [facility] +RNA cancer panel found no pathogenic mutations. The [facility] panel includes analysis of the following 70 genes:AIP, ALK, APC, ATM, AXIN2, BAP1, BARD1, BLM, BMPR1A, BRCA1, BRCA2, BRIP1, CDC73, CDH1, CDK4, CDKN1B, CDKN2A, CHEK2, CTNNA1, DICER1, EGFR, EPCAM, FH, FLCN, GREM1, HOXB13, KIT, LZTR1, MAX, MBD4, MEN1, MET, MITF, MLH1, MSH2, MSH6, MUTYH, NF1, NF2, NTHL1, PALB2, PDGFRA, PMS2, POLD1, POLE, POT1, PRKAR1A, PTCH1, PTEN, RAD51C, RAD51D, RB1, RET, SDHA, SDHAF2, SDHB, SDHC, SDHD, SMAD4, SMARCA4, SMARCB1, SMARCE1, STK11, SUFU, TMEM127, TP53, TSC1, TSC2, VHL. RNA analysis was included for applicable genes. The test report has been scanned into EPIC and is located under the Molecular Pathology section of the Results Review tab.  A portion of the result report is included below for reference.     We discussed with [name] that because current genetic testing is not perfect, it is possible there may be a gene mutation in one of these genes that current testing cannot detect, but that chance is small.  We also discussed, that there could be another gene that has not yet been discovered, or that we have not yet tested, that is responsible for the cancer diagnoses in the  family. It is also possible there is a hereditary cause for the cancer in the family that Megan Houston did not inherit and therefore was not identified in her testing.  Therefore, it is important to remain in touch with cancer genetics in the future so that we can continue to offer Megan Houston the most up to date genetic testing.   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  At this time, it is unknown if this variant is  associated with increased cancer risk or if this is a normal finding, but most variants such as this get reclassified to being inconsequential. It should not be used to make medical management decisions. With time, we suspect the lab will determine the significance of this variant, if any. If we do learn more about it, we will try to contact Ms. Bradway to discuss it further. However, it is important to stay in touch with us  periodically and keep the address and phone number up to date.  ADDITIONAL GENETIC TESTING: We discussed with Megan Houston that her genetic testing was fairly extensive.  If there are genes identified to increase cancer risk that can be analyzed in the future, we would be happy to discuss and coordinate this testing at that time.    Encouraged Megan Houston to learn more about cardiogenetic testing that may have been completed for family members, given her maternal half-brother's history of heart failure. She believes he had genetic testing, but is unsure of any results. It appears that her half-brother's father (not biologic relative to Ms. Alatorre) also had a history of heart failure and heart transplant, but determining if a hereditary explanation for this history was found could have utility to Ms. Haskell in understanding her risk for heart disease.   CANCER SCREENING RECOMMENDATIONS: Megan Houston test result is considered negative (normal).  This means that we have not identified a hereditary cause for her family history of cancer at this time. Most cancers happen by chance and this negative test suggests that her family history of cancer may fall into this category.    Possible reasons for Megan Houston's negative genetic test include:  1. There may be a gene mutation in one of these genes that current testing methods cannot detect but that chance is small.  2. There could be another gene that has not yet been discovered, or that we have not yet tested, that is responsible for the cancer  diagnoses in the family.  3.  There may be no hereditary risk for cancer in the family. The cancers in Megan Houston and/or her family may be sporadic/familial or due to other genetic and environmental factors. 4. It is also possible there is a hereditary cause for the cancer in the family that Ms. Pavey did not inherit. Ms. Moor did not inherit the SDHA c.91C>T, p.R31* variant that had been identified for her half-brother and mother.   Therefore, it is recommended she continue to follow the cancer management and screening guidelines provided by her primary healthcare provider. An individual's cancer risk and medical management are not determined by genetic test results alone. Overall cancer risk assessment incorporates additional factors, including personal medical history, family history, and any available genetic information that may result in a personalized plan for cancer prevention and surveillance  RECOMMENDATIONS FOR FAMILY MEMBERS:  Individuals in this family might be at some increased risk of developing cancer, over the general population risk, simply  due to the family history of cancer.  We recommended women in this family have a yearly mammogram beginning at age 31, or 47 years younger than the earliest onset of cancer, an annual clinical breast exam, and perform monthly breast self-exams. Women in this family should also have a gynecological exam as recommended by their primary provider. All family members should be referred for colonoscopy starting at age 14, or 22 years younger than the earliest onset of cancer. Family members identified to have a pathogenic variant in SDHA should consider following screening recommendations for hereditary paraganglioma and pheochromocytoma syndrome.   FOLLOW-UP: Lastly, we discussed with Ms. Brummell that cancer genetics is a rapidly advancing field and it is possible that new genetic tests will be appropriate for her and/or her family members in the future. We  encouraged her to remain in contact with cancer genetics on an annual basis so we can update her personal and family histories and let her know of advances in cancer genetics that may benefit this family.   Our contact number was provided. Ms. Klann questions were answered to her satisfaction, and she knows she is welcome to call us  at anytime with additional questions or concerns.   Burnard Ogren, MS, [facility] Licensed, [email or web address] [phone]

## 2024-02-21 NOTE — Telephone Encounter (Signed)
 LVM asking for call back to review results of genetic testing.

## 2024-02-21 NOTE — Telephone Encounter (Signed)
 Results disclosure. Please see genetic counseling note for full details.

## 2024-03-06 ENCOUNTER — Ambulatory Visit (INDEPENDENT_AMBULATORY_CARE_PROVIDER_SITE_OTHER)

## 2024-03-06 VITALS — BP 131/86 | HR 80 | Ht 65.0 in | Wt 216.5 lb

## 2024-03-06 DIAGNOSIS — Z3201 Encounter for pregnancy test, result positive: Secondary | ICD-10-CM

## 2024-03-06 LAB — POCT URINE PREGNANCY: Preg Test, Ur: POSITIVE — AB

## 2024-03-06 NOTE — Progress Notes (Signed)
" ° °  NURSE VISIT- PREGNANCY CONFIRMATION   SUBJECTIVE:  Megan Houston is a 34 y.o. G20P1001 female at [redacted]w[redacted]d by certain LMP of Patient's last menstrual period was 02/01/2024 (exact date). Here for pregnancy confirmation.  Home pregnancy test: positive x 7  She reports no complaints.  She is taking prenatal vitamins.    OBJECTIVE:  BP 131/86 (BP Location: Right Arm, Patient Position: Sitting, Cuff Size: Large)   Pulse 80   Ht 5' 5 (1.651 m)   Wt 216 lb 8 oz (98.2 kg)   LMP 02/01/2024 (Exact Date)   BMI 36.03 kg/m   Appears well, in no apparent distress  Results for orders placed or performed in visit on 03/06/24 (from the past 24 hours)  POCT urine pregnancy   Collection Time: 03/06/24  9:09 AM  Result Value Ref Range   Preg Test, Ur Positive (A) Negative    ASSESSMENT: Positive pregnancy test, [redacted]w[redacted]d by LMP    PLAN: Schedule for dating ultrasound in 4 weeks Prenatal vitamins: continue   Nausea medicines: not currently needed   OB packet given: Yes  Aleck FORBES Blase  03/06/2024 9:09 AM  "

## 2024-03-10 ENCOUNTER — Encounter: Payer: Self-pay | Admitting: Women's Health

## 2024-03-13 ENCOUNTER — Ambulatory Visit

## 2024-03-13 VITALS — BP 121/78 | HR 80

## 2024-03-13 DIAGNOSIS — Z0130 Encounter for examination of blood pressure without abnormal findings: Secondary | ICD-10-CM

## 2024-03-13 NOTE — Progress Notes (Signed)
" ° °  NURSE VISIT- BLOOD PRESSURE CHECK  SUBJECTIVE:  Megan Houston is a 34 y.o. G8P1001 female here for BP check. She is [redacted]w[redacted]d pregnant    HYPERTENSION ROS:  Pregnant  Severe headaches that don't go away with tylenol /other medicines: No  Visual changes (seeing spots/double/blurred vision) No  Severe pain under right breast breast or in center of upper chest No  Severe nausea/vomiting No  Taking medicines as instructed not applicable   OBJECTIVE:  BP 121/78   Pulse 80   LMP 02/01/2024 (Exact Date)   Appearance alert, well appearing, and in no distress.  ASSESSMENT: Pregnancy [redacted]w[redacted]d  blood pressure check  PLAN: Discussed with Megan Daring, NP   Recommendations: Keep taking BP at home and bring home cuff in to New OB appointment to check accuracy    Follow-up: as scheduled   Megan Houston  03/13/2024 8:49 AM  "

## 2024-04-03 ENCOUNTER — Other Ambulatory Visit

## 2024-07-01 ENCOUNTER — Telehealth: Admitting: Neurology
